# Patient Record
Sex: Male | Born: 1945 | Race: Black or African American | Hispanic: No | Marital: Married | Smoking: Never smoker
Health system: Southern US, Community
[De-identification: ages and names within clinical notes are randomized; demographics above are authoritative.]

## PROBLEM LIST (undated history)

## (undated) DIAGNOSIS — R7301 Impaired fasting glucose: Secondary | ICD-10-CM

## (undated) DIAGNOSIS — I700 Atherosclerosis of aorta: Secondary | ICD-10-CM

## (undated) DIAGNOSIS — N12 Tubulo-interstitial nephritis, not specified as acute or chronic: Secondary | ICD-10-CM

## (undated) DIAGNOSIS — C801 Malignant (primary) neoplasm, unspecified: Secondary | ICD-10-CM

## (undated) DIAGNOSIS — I10 Essential (primary) hypertension: Secondary | ICD-10-CM

## (undated) DIAGNOSIS — I2602 Saddle embolus of pulmonary artery with acute cor pulmonale: Secondary | ICD-10-CM

## (undated) DIAGNOSIS — K859 Acute pancreatitis without necrosis or infection, unspecified: Secondary | ICD-10-CM

## (undated) DIAGNOSIS — R911 Solitary pulmonary nodule: Secondary | ICD-10-CM

## (undated) HISTORY — PX: PROSTATE SURGERY: SHX751

---

## 2012-04-12 ENCOUNTER — Emergency Department (HOSPITAL_COMMUNITY)
Admission: EM | Admit: 2012-04-12 | Discharge: 2012-04-12 | Disposition: A | Discharge status: Home / Self Care | Payer: Medicaid Other | Attending: Emergency Medicine | Admitting: Emergency Medicine

## 2012-04-12 ENCOUNTER — Emergency Department (HOSPITAL_COMMUNITY): Payer: Medicaid Other

## 2012-04-12 ENCOUNTER — Encounter (HOSPITAL_COMMUNITY): Payer: Self-pay | Admitting: Emergency Medicine

## 2012-04-12 DIAGNOSIS — I10 Essential (primary) hypertension: Secondary | ICD-10-CM | POA: Insufficient documentation

## 2012-04-12 DIAGNOSIS — S298XXA Other specified injuries of thorax, initial encounter: Secondary | ICD-10-CM | POA: Insufficient documentation

## 2012-04-12 DIAGNOSIS — S99929A Unspecified injury of unspecified foot, initial encounter: Secondary | ICD-10-CM | POA: Insufficient documentation

## 2012-04-12 DIAGNOSIS — Y9389 Activity, other specified: Secondary | ICD-10-CM | POA: Insufficient documentation

## 2012-04-12 DIAGNOSIS — Y92410 Unspecified street and highway as the place of occurrence of the external cause: Secondary | ICD-10-CM | POA: Insufficient documentation

## 2012-04-12 DIAGNOSIS — Z79899 Other long term (current) drug therapy: Secondary | ICD-10-CM | POA: Insufficient documentation

## 2012-04-12 DIAGNOSIS — S8990XA Unspecified injury of unspecified lower leg, initial encounter: Secondary | ICD-10-CM | POA: Insufficient documentation

## 2012-04-12 HISTORY — DX: Essential (primary) hypertension: I10

## 2012-04-12 LAB — POCT I-STAT, CHEM 8
BUN: 14 mg/dL (ref 6–23)
Chloride: 105 mEq/L (ref 96–112)
Potassium: 4.4 mEq/L (ref 3.5–5.1)
Sodium: 141 mEq/L (ref 135–145)
TCO2: 26 mmol/L (ref 0–100)

## 2012-04-12 MED ORDER — OLMESARTAN-AMLODIPINE-HCTZ 20-5-12.5 MG PO TABS: ORAL_TABLET | ORAL | Status: DC

## 2012-04-12 MED ORDER — OXYCODONE-ACETAMINOPHEN 5-325 MG PO TABS
1.0000 | ORAL_TABLET | Freq: Once | ORAL | Status: AC
  Administered 2012-04-12: 1 via ORAL
  Filled 2012-04-12 (×2): qty 1

## 2012-04-12 MED ORDER — IBUPROFEN 400 MG PO TABS: 400.0000 mg | ORAL_TABLET | Freq: Four times a day (QID) | ORAL | Status: DC | PRN

## 2012-04-12 MED ORDER — OXYCODONE-ACETAMINOPHEN 5-325 MG PO TABS
1.0000 | ORAL_TABLET | ORAL | Status: DC | PRN
Start: 2012-04-12 — End: 2012-09-15

## 2012-04-12 MED ORDER — IOHEXOL 300 MG/ML  SOLN
80.0000 mL | Freq: Once | INTRAMUSCULAR | Status: AC | PRN
  Administered 2012-04-12: 80 mL via INTRAVENOUS
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
## 2012-04-12 NOTE — Progress Notes (Signed)
No pcp listed [facility] ED CM spoke with pt and family at bedside to confirm pcp as Jared Clements [address] [place]

## 2012-04-12 NOTE — ED Notes (Signed)
Family at bedside. 

## 2012-04-12 NOTE — ED Notes (Signed)
Pt returned from ct scan
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
## 2012-04-14 NOTE — ED Provider Notes (Signed)
Medical screening examination/treatment/procedure(s) were performed by non-physician practitioner and as supervising physician I was immediately available for consultation/collaboration.  Brianna Esson R. Ashlie Mcmenamy, MD 04/14/12 1057 

## 2012-09-15 ENCOUNTER — Encounter (HOSPITAL_COMMUNITY): Payer: Self-pay | Admitting: *Deleted

## 2012-09-15 ENCOUNTER — Inpatient Hospital Stay (HOSPITAL_COMMUNITY)
Admission: EM | Admit: 2012-09-15 | Discharge: 2012-09-18 | DRG: 690 | Disposition: A | Discharge status: Home / Self Care | Payer: Medicaid Other | Attending: Internal Medicine | Admitting: Internal Medicine

## 2012-09-15 DIAGNOSIS — N12 Tubulo-interstitial nephritis, not specified as acute or chronic: Principal | ICD-10-CM | POA: Diagnosis present

## 2012-09-15 DIAGNOSIS — A498 Other bacterial infections of unspecified site: Secondary | ICD-10-CM | POA: Diagnosis present

## 2012-09-15 DIAGNOSIS — N390 Urinary tract infection, site not specified: Secondary | ICD-10-CM

## 2012-09-15 DIAGNOSIS — E871 Hypo-osmolality and hyponatremia: Secondary | ICD-10-CM | POA: Diagnosis present

## 2012-09-15 DIAGNOSIS — E876 Hypokalemia: Secondary | ICD-10-CM | POA: Diagnosis present

## 2012-09-15 DIAGNOSIS — Z8546 Personal history of malignant neoplasm of prostate: Secondary | ICD-10-CM

## 2012-09-15 DIAGNOSIS — N179 Acute kidney failure, unspecified: Secondary | ICD-10-CM | POA: Diagnosis present

## 2012-09-15 DIAGNOSIS — E869 Volume depletion, unspecified: Secondary | ICD-10-CM | POA: Diagnosis present

## 2012-09-15 DIAGNOSIS — I10 Essential (primary) hypertension: Secondary | ICD-10-CM | POA: Diagnosis present

## 2012-09-15 HISTORY — DX: Malignant (primary) neoplasm, unspecified: C80.1

## 2012-09-15 HISTORY — DX: Tubulo-interstitial nephritis, not specified as acute or chronic: N12

## 2012-09-15 LAB — CBC WITH DIFFERENTIAL/PLATELET
Eosinophils Relative: 0 % (ref 0–5)
HCT: 35.1 % — ABNORMAL LOW (ref 39.0–52.0)
Hemoglobin: 12.6 g/dL — ABNORMAL LOW (ref 13.0–17.0)
Lymphocytes Relative: 11 % — ABNORMAL LOW (ref 12–46)
Lymphs Abs: 1 10*3/uL (ref 0.7–4.0)
MCV: 84.4 fL (ref 78.0–100.0)
Platelets: 186 10*3/uL (ref 150–400)
RBC: 4.16 MIL/uL — ABNORMAL LOW (ref 4.22–5.81)
WBC: 9.3 10*3/uL (ref 4.0–10.5)

## 2012-09-15 LAB — URINALYSIS, MICROSCOPIC ONLY
Ketones, ur: 15 mg/dL — AB
Nitrite: NEGATIVE
Protein, ur: >300 mg/dL — AB
Urobilinogen, UA: 2 mg/dL — ABNORMAL HIGH (ref 0.0–1.0)

## 2012-09-15 LAB — COMPREHENSIVE METABOLIC PANEL
ALT: 17 U/L (ref 0–53)
Alkaline Phosphatase: 89 U/L (ref 39–117)
CO2: 27 mEq/L (ref 19–32)
Calcium: 9.2 mg/dL (ref 8.4–10.5)
GFR calc Af Amer: 39 mL/min — ABNORMAL LOW (ref >90)
GFR calc non Af Amer: 34 mL/min — ABNORMAL LOW (ref >90)
Glucose, Bld: 125 mg/dL — ABNORMAL HIGH (ref 70–99)
Potassium: 2.9 mEq/L — ABNORMAL LOW (ref 3.5–5.1)
Sodium: 134 mEq/L — ABNORMAL LOW (ref 135–145)

## 2012-09-15 LAB — CBC
HCT: 34 % — ABNORMAL LOW (ref 39.0–52.0)
Hemoglobin: 12 g/dL — ABNORMAL LOW (ref 13.0–17.0)
WBC: 8.7 10*3/uL (ref 4.0–10.5)

## 2012-09-15 LAB — MAGNESIUM: Magnesium: 2 mg/dL (ref 1.5–2.5)

## 2012-09-15 LAB — LACTIC ACID, PLASMA: Lactic Acid, Venous: 1.4 mmol/L (ref 0.5–2.2)

## 2012-09-15 LAB — CREATININE, SERUM: GFR calc Af Amer: 35 mL/min — ABNORMAL LOW (ref >90)

## 2012-09-15 MED ORDER — SODIUM CHLORIDE 0.9 % IV SOLN
INTRAVENOUS | Status: DC
  Administered 2012-09-16 – 2012-09-17 (×4): via INTRAVENOUS

## 2012-09-15 MED ORDER — ACETAMINOPHEN 325 MG PO TABS
650.0000 mg | ORAL_TABLET | Freq: Four times a day (QID) | ORAL | Status: DC | PRN
  Administered 2012-09-15: 650 mg via ORAL
  Filled 2012-09-15: qty 2

## 2012-09-15 MED ORDER — SODIUM CHLORIDE 0.9 % IV SOLN
INTRAVENOUS | Status: AC
  Administered 2012-09-15: 1000 mL via INTRAVENOUS

## 2012-09-15 MED ORDER — DEXTROSE 5 % IV SOLN
1.0000 g | INTRAVENOUS | Status: DC
  Administered 2012-09-16 – 2012-09-17 (×2): 1 g via INTRAVENOUS
  Filled 2012-09-15 (×3): qty 10

## 2012-09-15 MED ORDER — ONDANSETRON HCL 4 MG/2ML IJ SOLN: 4.0000 mg | Freq: Four times a day (QID) | INTRAMUSCULAR | Status: DC | PRN

## 2012-09-15 MED ORDER — ACETAMINOPHEN 650 MG RE SUPP: 650.0000 mg | Freq: Four times a day (QID) | RECTAL | Status: DC | PRN

## 2012-09-15 MED ORDER — POTASSIUM CHLORIDE 10 MEQ/100ML IV SOLN
10.0000 meq | INTRAVENOUS | Status: DC
  Administered 2012-09-15: 10 meq via INTRAVENOUS
  Filled 2012-09-15: qty 100

## 2012-09-15 MED ORDER — HYDROCODONE-ACETAMINOPHEN 5-325 MG PO TABS
1.0000 | ORAL_TABLET | ORAL | Status: DC | PRN
Start: 2012-09-15 — End: 2012-09-18

## 2012-09-15 MED ORDER — SODIUM CHLORIDE 0.9 % IV SOLN
Freq: Once | INTRAVENOUS | Status: AC
  Administered 2012-09-15: 13:00:00 via INTRAVENOUS

## 2012-09-15 MED ORDER — HEPARIN SODIUM (PORCINE) 5000 UNIT/ML IJ SOLN
5000.0000 [IU] | Freq: Three times a day (TID) | INTRAMUSCULAR | Status: DC
  Administered 2012-09-15 – 2012-09-18 (×9): 5000 [IU] via SUBCUTANEOUS
  Filled 2012-09-15 (×12): qty 1

## 2012-09-15 MED ORDER — SODIUM CHLORIDE 0.9 % IV BOLUS (SEPSIS)
1000.0000 mL | Freq: Once | INTRAVENOUS | Status: AC
  Administered 2012-09-15: 1000 mL via INTRAVENOUS

## 2012-09-15 MED ORDER — DEXTROSE 5 % IV SOLN
1.0000 g | Freq: Once | INTRAVENOUS | Status: DC
  Administered 2012-09-15: 1 g via INTRAVENOUS
  Filled 2012-09-15: qty 10

## 2012-09-15 MED ORDER — ONDANSETRON HCL 4 MG PO TABS: 4.0000 mg | ORAL_TABLET | Freq: Four times a day (QID) | ORAL | Status: DC | PRN

## 2012-09-15 MED ORDER — SODIUM CHLORIDE 0.9 % IV BOLUS (SEPSIS)
500.0000 mL | Freq: Once | INTRAVENOUS | Status: AC
  Administered 2012-09-15: 500 mL via INTRAVENOUS

## 2012-09-15 NOTE — ED Notes (Signed)
PT with hx of prostate ca tx with radiation 2013 to ED c/o fever, lower abd pain and burning on urination x 2 days.  Denies nv, but c/o diarrhea.

## 2012-09-15 NOTE — H&P (Signed)
[facility] History and Physical  Jared Clements ZOX:[id number] DOB: Oct 17, 1945 DOA: 09/15/2012  Referring physician: Dr Radford Pax PCP: Jearld Lesch, MD  Specialists: none  Chief Complaint: fever  HPI: Jared Clements is a 67 y.o. male  Past medical history of prostate cancer status post surgery and radiation in 2013, comes in for fever 1 day prior to admission. He also relates some dysuria andnonbloody diarrhea.he relates his ear and also smells bad. color urine.  In the ED: Urinalysis obtained compatible with infectious etiology.  CBC does not show leukocytosis, he is also hyponatremic and in acute renal failure. His previous creatinine back in 2013 0.9. So we were asked to admit and further evaluate.  Review of Systems: The patient denies anorexia,  weight loss,, vision loss, decreased hearing, hoarseness, chest pain, syncope, dyspnea on exertion, peripheral edema, balance deficits, hemoptysis,  melena, hematochezia, severe indigestion/heartburn, hematuria, incontinence, genital sores, muscle weakness, suspicious skin lesions, transient blindness, difficulty walking, depression, unusual weight change, abnormal bleeding, enlarged lymph nodes, angioedema, and breast masses.    Past Medical History  Diagnosis Date  . Hypertension   . Cancer     prostates ca   Past Surgical History  Procedure Laterality Date  . Prostate surgery     Social History:  reports that he has never smoked. He does not have any smokeless tobacco history on file. He reports that he does not drink alcohol or use illicit drugs. Lives at home can perform all his ADL's  No Known Allergies  Family History  Problem Relation Age of Onset  . Other Mother   . Other Father     Prior to Admission medications   Medication Sig Start Date End Date Taking? Authorizing Provider  Olmesartan-Amlodipine-HCTZ (TRIBENZOR) 20-5-12.5 MG TABS Take 1 tablet by mouth daily.   Yes Historical Provider, MD   Physical  Exam: Filed Vitals:   09/15/12 1111 09/15/12 1229  BP: 117/65 97/54  Pulse: 96 78  Temp: 99.2 F (37.3 C) 99.3 F (37.4 C)  TempSrc: Oral Oral  Resp: 16 18  SpO2: 96% 97%    BP 97/54  Pulse 78  Temp(Src) 99.3 F (37.4 C) (Oral)  Resp 18  SpO2 97%  General Appearance:    Alert, cooperative, no distress, appears stated age  Head:    Normocephalic, without obvious abnormality, atraumatic           Throat:   Lips, mucosa, and tongue normal;   Neck:   Supple, symmetrical, trachea midline, no adenopathy;       thyroid:  No enlargement/tenderness/nodules; no carotid   bruit or JVD  Back:     Symmetric, no curvature, ROM normal, no CVA tenderness  Lungs:     Clear to auscultation bilaterally, respirations unlabored  Chest wall:    No tenderness or deformity  Heart:    Regular rate and rhythm, S1 and S2 normal, no murmur, rub   or gallop  Abdomen:     Soft, suprapubic tenderness, no CVA tenderness,bowel sounds active all four quadrants,    no masses, no organomegaly        Extremities:   Extremities normal, atraumatic, no cyanosis or edema  Pulses:   2+ and symmetric all extremities  Skin:   Skin color, texture, turgor normal, no rashes or lesions  Lymph nodes:   Cervical, supraclavicular, and axillary nodes normal  Neurologic:   CNII-XII intact. Normal strength, sensation and reflexes      throughout    Labs on Admission:  Basic Metabolic Panel:  Recent Labs Lab 09/15/12 1144  NA 134*  K 2.9*  CL 96  CO2 27  GLUCOSE 125*  BUN 34*  CREATININE 1.94*  CALCIUM 9.2   Liver Function Tests:  Recent Labs Lab 09/15/12 1144  AST 25  ALT 17  ALKPHOS 89  BILITOT 1.7*  PROT 7.9  ALBUMIN 3.3*   No results found for this basename: LIPASE, AMYLASE,  in the last 168 hours No results found for this basename: AMMONIA,  in the last 168 hours CBC:  Recent Labs Lab 09/15/12 1144  WBC 9.3  NEUTROABS 7.7  HGB 12.6*  HCT 35.1*  MCV 84.4  PLT 186   Cardiac  Enzymes: No results found for this basename: CKTOTAL, CKMB, CKMBINDEX, TROPONINI,  in the last 168 hours  BNP (last 3 results) No results found for this basename: PROBNP,  in the last 8760 hours CBG: No results found for this basename: GLUCAP,  in the last 168 hours  Radiological Exams on Admission: No results found.  EKG: Independently reviewed. none  Assessment/Plan  AKI (acute kidney injury)/ Pyelonephritis - His acute renal failure is most likely ultifactorial  secondary to urinary tract infection, ARB,  diuretic, he is a Hyponatremic and hypochloremic which is compatible with decreased intravascular volume. We'll go ahead and place a Foley study strict I.'s and O.'s and check a basic metabolic panel in the morning. 1 L of normal saline and continue normal saline.  - Dc'd ARB and HCTZ.  - Go ahead and get blood cultures and urine cultures were started empirically on Rocephin.  Hyponatremia: - THis most likely secondary to decreased intravascular volume. We'll go ahead and start him on aggressive IV fluid monitor strict I.'s and O.'s and check a basic metabolic panel in the morning.  Hypokalemia - he is on HCTZ at home over and stopped this and repeated check a magnesium level.  HTN: - will hold his BP medications  Code Status: full Family Communication: Son Disposition Plan: inpatient 3-4 days  Time spent: 67  Marinda Elk [facility] Pager [phone]  If 7PM-7AM, please contact night-coverage [email or web address] Password [email or web address] 09/15/2012, 1:33 PM

## 2012-09-15 NOTE — ED Provider Notes (Signed)
History     CSN: [id number]  Arrival date & time 09/15/12  1104   First MD Initiated Contact with Patient 09/15/12 1123      Chief Complaint  Patient presents with  . Urinary Tract Infection    (Consider location/radiation/quality/duration/timing/severity/associated sxs/prior treatment) HPI 67 yo M with HTN and h/o prostate ca treated with surgery and radiation presenting with subjective fevers/chills and dysuria since yesterday.  History is obtained from patient and his son.  He underwent treatment for prostate cancer in 2012-2013.  The current illness started with some non-bloody diarrhea yesterday morning.  Also has decreased appetite, but no nausea or vomiting.  No abdominal pain or flank pain.   Past Medical History  Diagnosis Date  . Hypertension   . Cancer     prostates ca    Past Surgical History  Procedure Laterality Date  . Prostate surgery      No family history on file.  History  Substance Use Topics  . Smoking status: Never Smoker   . Smokeless tobacco: Not on file  . Alcohol Use: No      Review of Systems  Constitutional: Positive for fever, chills and appetite change.  HENT: Negative.   Eyes: Negative.   Respiratory: Negative.   Cardiovascular: Negative.   Gastrointestinal: Positive for diarrhea. Negative for nausea, vomiting, abdominal pain and blood in stool.  Genitourinary: Positive for dysuria. Negative for flank pain, discharge and testicular pain.  Musculoskeletal: Negative.   Skin: Negative.   Neurological: Negative.     Allergies  Review of patient's allergies indicates no known allergies.  Home Medications   Current Outpatient Rx  Name  Route  Sig  Dispense  Refill  . Olmesartan-Amlodipine-HCTZ (TRIBENZOR) 20-5-12.5 MG TABS   Oral   Take 1 tablet by mouth daily.           BP 117/65  Pulse 96  Temp(Src) 99.2 F (37.3 C) (Oral)  Resp 16  SpO2 96%  Physical Exam  Constitutional: He is oriented to person, place, and  time.  Tall, think man.  No distress.  HENT:  Head: Normocephalic and atraumatic.  Cardiovascular: Normal rate, regular rhythm, normal heart sounds and intact distal pulses.   No murmur heard. Pulmonary/Chest: Effort normal and breath sounds normal. No respiratory distress. He has no wheezes. He has no rales.  Abdominal: Soft. Bowel sounds are normal. He exhibits no distension. There is no tenderness. There is no rebound and no guarding.  Musculoskeletal: He exhibits no edema and no tenderness.  Neurological: He is alert and oriented to person, place, and time. He exhibits normal muscle tone. Coordination normal.  Skin: Skin is warm and dry. No rash noted. No erythema.    ED Course  Procedures (including critical care time)  Labs Reviewed  CBC WITH DIFFERENTIAL - Abnormal; Notable for the following:    RBC 4.16 (*)    Hemoglobin 12.6 (*)    HCT 35.1 (*)    Neutrophils Relative 83 (*)    Lymphocytes Relative 11 (*)    All other components within normal limits  COMPREHENSIVE METABOLIC PANEL - Abnormal; Notable for the following:    Sodium 134 (*)    Potassium 2.9 (*)    Glucose, Bld 125 (*)    BUN 34 (*)    Creatinine, Ser 1.94 (*)    Albumin 3.3 (*)    Total Bilirubin 1.7 (*)    GFR calc non Af Amer 34 (*)    GFR calc Af Denyse Dago  39 (*)    All other components within normal limits  URINALYSIS, MICROSCOPIC ONLY - Abnormal; Notable for the following:    Color, Urine AMBER (*)    APPearance TURBID (*)    Glucose, UA 100 (*)    Hgb urine dipstick LARGE (*)    Bilirubin Urine MODERATE (*)    Ketones, ur 15 (*)    Protein, ur >300 (*)    Urobilinogen, UA 2.0 (*)    Leukocytes, UA LARGE (*)    Bacteria, UA FEW (*)    All other components within normal limits  URINE CULTURE   No results found.   No diagnosis found.    MDM  67 yo M with HTN and prostate ca treated with surgery and radiation presenting with subjective fevers/chills and dysuria since yesterday.  DDx primarily  including complicated UTI and proctitis.  Will check ua, cmp, cbc.  1:00PM: UA consistent with infection.  No white count, but creatinine more than doubled.  Blood pressure soft at 97/54.  He has already taken his BP meds this morning.  Will give 500cc bolus followed by 125cc/hr.  Will check lactic acid and baseline EKG.  Will given ceftriaxone 1g IV.  Discussed with [facility] who will admit the patient telemetry.         Phebe Colla, MD 09/15/12 [phone]

## 2012-09-15 NOTE — Progress Notes (Signed)
Jared Clements [id number] Admission Data: 09/15/2012 5:50 PM Attending Provider: Marinda Elk, MD  YQM:VHQIONGE,XBMWUX M, MD Consults/ Treatment Team:    Jared Clements is a 67 y.o. male patient admitted from ED with a UTI and acute kidney injury. awake, alert  & orientated  X 3, Limited English, son and daughter translate at bedside. Full Code, VSS - Blood pressure 149/86, pulse 116, temperature 98.6 F (37 C), temperature source Oral, resp. rate 18, height 6\' 5"  (1.956 m), weight 74.163 kg (163 lb 8 oz), SpO2 99.00%.,, no c/o shortness of breath, no c/o chest pain, no distress noted.   IV site WDL:  wrist left, condition patent and no redness with a transparent dsg that's clean dry and intact.  Allergies:  No Known Allergies   Past Medical History  Diagnosis Date  . Hypertension   . Cancer     prostates ca    History:  obtained from child. Tobacco/alcohol: denied none  Pt orientation to unit, room and routine. Information packet given to patient/family and safety video watched.  Admission INP armband ID verified with patient/family, and in place. SR up x 2, fall risk assessment complete with Patient and family verbalizing understanding of risks associated with falls. Pt verbalizes an understanding of how to use the call bell and to call for help before getting out of bed.  Skin, clean-dry- intact without evidence of bruising, or skin tears.   No evidence of skin break down noted on exam.     Will cont to monitor and assist as needed.  Cindra Eves, RN 09/15/2012 5:50 PM

## 2012-09-15 NOTE — ED Provider Notes (Signed)
I saw the patient, reviewed the resident's note and I agree with the findings and plan.   .Face to face Exam:  General:  Awake HEENT:  Atraumatic Resp:  Normal effort Abd:  Nondistended Neuro:No focal weakness    Shanitra Phillippi L Rasean Joos, MD 09/15/12 1343 

## 2012-09-16 ENCOUNTER — Inpatient Hospital Stay (HOSPITAL_COMMUNITY): Payer: Medicaid Other

## 2012-09-16 DIAGNOSIS — N390 Urinary tract infection, site not specified: Secondary | ICD-10-CM

## 2012-09-16 LAB — BASIC METABOLIC PANEL
BUN: 37 mg/dL — ABNORMAL HIGH (ref 6–23)
CO2: 27 mEq/L (ref 19–32)
Chloride: 106 mEq/L (ref 96–112)
Glucose, Bld: 119 mg/dL — ABNORMAL HIGH (ref 70–99)
Potassium: 3.1 mEq/L — ABNORMAL LOW (ref 3.5–5.1)

## 2012-09-16 MED ORDER — POTASSIUM CHLORIDE CRYS ER 20 MEQ PO TBCR
60.0000 meq | EXTENDED_RELEASE_TABLET | Freq: Four times a day (QID) | ORAL | Status: AC
  Administered 2012-09-16 (×2): 60 meq via ORAL
  Filled 2012-09-16 (×2): qty 3

## 2012-09-16 NOTE — Evaluation (Signed)
Physical Therapy Evaluation Patient Details Name: Jared Clements MRN: [id number] DOB: 31-Oct-1945 Today's Date: 09/16/2012 Time: 4098-1191 PT Time Calculation (min): 9 min  PT Assessment / Plan / Recommendation Clinical Impression  Pt adm with UTI and AKI.  Pt doing well with mobility.  Explained to son that pt may fatigue more quickly at home as he recovers from this illness.    PT Assessment  Patent does not need any further PT services    Follow Up Recommendations  No PT follow up    Does the patient have the potential to tolerate intense rehabilitation      Barriers to Discharge        Equipment Recommendations  None recommended by PT    Recommendations for Other Services     Frequency      Precautions / Restrictions Precautions Precautions: None   Pertinent Vitals/Pain N/A      Mobility  Bed Mobility Bed Mobility: Right Sidelying to Sit Right Sidelying to Sit: 7: Independent Transfers Transfers: Sit to Stand;Stand to Sit Sit to Stand: 7: Independent Stand to Sit: 7: Independent Ambulation/Gait Ambulation/Gait Assistance: 6: Modified independent (Device/Increase time) Ambulation Distance (Feet): 200 Feet Assistive device: None Gait Pattern: Step-through pattern    Exercises     PT Diagnosis:    PT Problem List:   PT Treatment Interventions:     PT Goals    Visit Information  Last PT Received On: 09/16/12 Assistance Needed: +1    Subjective Data  Subjective: Pt's son states that pt is eager to go home. Patient Stated Goal: Return home   Prior Functioning  Home Living Lives With: Family Available Help at Discharge: Family Home Layout: One level Home Adaptive Equipment: None Prior Function Level of Independence: Independent Communication Communication: Prefers language other than English (son interpreting)    Cognition  Cognition Arousal/Alertness: Awake/alert Behavior During Therapy: WFL for tasks assessed/performed Overall Cognitive  Status: Within Functional Limits for tasks assessed    Extremity/Trunk Assessment Right Upper Extremity Assessment RUE ROM/Strength/Tone: WFL for tasks assessed Left Upper Extremity Assessment LUE ROM/Strength/Tone: WFL for tasks assessed Right Lower Extremity Assessment RLE ROM/Strength/Tone: WFL for tasks assessed Left Lower Extremity Assessment LLE ROM/Strength/Tone: WFL for tasks assessed   Balance Balance Balance Assessed: Yes Static Standing Balance Static Standing - Balance Support: No upper extremity supported Static Standing - Level of Assistance: 7: Independent  End of Session PT - End of Session Activity Tolerance: Patient tolerated treatment well Patient left: in bed (sitting EOB) Nurse Communication: Mobility status  GP     Ramsay Bognar 09/16/2012, 10:07 AM  Skip Mayer PT [phone]

## 2012-09-16 NOTE — Progress Notes (Signed)
[facility] PROGRESS NOTE  Jared Clements ZOX:[id number] DOB: 27-Jun-1945 DOA: 09/15/2012 PCP: Jearld Lesch, MD  HPI/Subjective: Feels much better, his son is at bedside. Denies any fever or chills  Assessment/Plan:  Pyelonephritis/UTI -Patient started empirically on Rocephin. -Urine blood cultures obtained. -Antibiotics will be adjusted according to the urine culture results.  Acute renal failure -Nephrotoxic medications including ARB and hydrochlorothiazide discontinued. -Patient started on IV fluids. Check renal ultrasound. -Check BMP in am. Baseline creatinine in November of 2013 was 0.9 per  Hyponatremia -This is likely secondary to intravascular volume depletion. -Aggressively hydrate with IV fluids, check sodium in a.m.  Hypokalemia -Presented with potassium of 2.9 -Replace with oral supplements, check potassium in a.m.  Hypertension -Cold blood pressure medications including ARB and HCTZ.  Code Status: Full code Family Communication: Plan explained to the patient with his son at bedside. Disposition Plan: Inpatient   Consultants:  None  Procedures:  None  Antibiotics:  Rocephin    Objective: Filed Vitals:   09/15/12 1654 09/15/12 2121 09/15/12 2320 09/16/12 0446  BP: 149/86 140/67  136/73  Pulse: 116 102  78  Temp:  103 F (39.4 C) 100 F (37.8 C) 100.1 F (37.8 C)  TempSrc:  Oral Oral Oral  Resp:  20  20  Height:      Weight:      SpO2:  94%  95%    Intake/Output Summary (Last 24 hours) at 09/16/12 1100 Last data filed at 09/16/12 0851  Gross per 24 hour  Intake 565.42 ml  Output      0 ml  Net 565.42 ml   Filed Weights   09/15/12 1615  Weight: 74.163 kg (163 lb 8 oz)    Exam: General: Alert and awake, oriented x3, not in any acute distress. HEENT: anicteric sclera, pupils reactive to light and accommodation, EOMI CVS: S1-S2 clear, no murmur rubs or gallops Chest: clear to auscultation bilaterally, no wheezing, rales or  rhonchi Abdomen: soft nontender, nondistended, normal bowel sounds, no organomegaly Extremities: no cyanosis, clubbing or edema noted bilaterally Neuro: Cranial nerves II-XII intact, no focal neurological deficits  Data Reviewed: Basic Metabolic Panel:  Recent Labs Lab 09/15/12 1144 09/15/12 1555 09/16/12 0615  NA 134*  --  141  K 2.9*  --  3.1*  CL 96  --  106  CO2 27  --  27  GLUCOSE 125*  --  119*  BUN 34*  --  37*  CREATININE 1.94* 2.11* 2.36*  CALCIUM 9.2  --  8.4  MG  --  2.0  --    Liver Function Tests:  Recent Labs Lab 09/15/12 1144  AST 25  ALT 17  ALKPHOS 89  BILITOT 1.7*  PROT 7.9  ALBUMIN 3.3*   No results found for this basename: LIPASE, AMYLASE,  in the last 168 hours No results found for this basename: AMMONIA,  in the last 168 hours CBC:  Recent Labs Lab 09/15/12 1144 09/15/12 1709  WBC 9.3 8.7  NEUTROABS 7.7  --   HGB 12.6* 12.0*  HCT 35.1* 34.0*  MCV 84.4 85.6  PLT 186 156   Cardiac Enzymes: No results found for this basename: CKTOTAL, CKMB, CKMBINDEX, TROPONINI,  in the last 168 hours BNP (last 3 results) No results found for this basename: PROBNP,  in the last 8760 hours CBG: No results found for this basename: GLUCAP,  in the last 168 hours  No results found for this or any previous visit (from the past 240 hour(s)).  Studies: No results found.  Scheduled Meds: . cefTRIAXone (ROCEPHIN)  IV  1 g Intravenous Q24H  . heparin  5,000 Units Subcutaneous Q8H   Continuous Infusions: . sodium chloride 125 mL/hr at 09/16/12 1610    Active Problems:   AKI (acute kidney injury)   Pyelonephritis   Hyponatremia   Hypokalemia    Time spent: 40 minutes    [facility] A  [facility] Pager [phone] If 7PM-7AM, please contact night-coverage at [email or web address], password [facility] 09/16/2012, 11:00 AM  LOS: 1 day

## 2012-09-17 LAB — URINE CULTURE: Colony Count: >=100000

## 2012-09-17 LAB — BASIC METABOLIC PANEL
BUN: 23 mg/dL (ref 6–23)
CO2: 25 mEq/L (ref 19–32)
Chloride: 105 mEq/L (ref 96–112)
Creatinine, Ser: 1.5 mg/dL — ABNORMAL HIGH (ref 0.50–1.35)
Glucose, Bld: 100 mg/dL — ABNORMAL HIGH (ref 70–99)

## 2012-09-17 LAB — CBC
HCT: 28.4 % — ABNORMAL LOW (ref 39.0–52.0)
MCHC: 35.6 g/dL (ref 30.0–36.0)
MCV: 83.8 fL (ref 78.0–100.0)
RDW: 13 % (ref 11.5–15.5)
WBC: 4.9 10*3/uL (ref 4.0–10.5)

## 2012-09-17 MED ORDER — POTASSIUM CHLORIDE CRYS ER 20 MEQ PO TBCR
40.0000 meq | EXTENDED_RELEASE_TABLET | Freq: Once | ORAL | Status: AC
  Administered 2012-09-17: 40 meq via ORAL
  Filled 2012-09-17: qty 2

## 2012-09-17 NOTE — Progress Notes (Signed)
[facility] PROGRESS NOTE  Jared Clements AVW:[id number] DOB: 11/16/1945 DOA: 09/15/2012 PCP: Jearld Lesch, MD  HPI/Subjective: Feels much better, still complaining about blood-tinged urine  Assessment/Plan:  Pyelonephritis/UTI -Patient started empirically on Rocephin. -Urine culture showed Escherichia coli, resistant to fluoroquinolones -Likely can be discharged on oral Ceftin in a.m. Complete total 14 days of antibiotics.  Acute renal failure -Nephrotoxic medications including ARB and hydrochlorothiazide discontinued. -Creatinine baseline in November of 2013 was 0.9, renal ultrasound is negative for abnormalities. -Creatinine improved to 1.5 from 2.3, continue IV fluids check BMP in the morning.  Hyponatremia -This is likely secondary to intravascular volume depletion. -Aggressively hydrate with IV fluids, check sodium in a.m.  Hypokalemia -Presented with potassium of 2.9 -Replace with oral supplements, check potassium in a.m.  Hypertension -Hold blood pressure medications including ARB and HCTZ.  Code Status: Full code Family Communication: Plan explained to the patient with his son at bedside. Disposition Plan: Inpatient   Consultants:  None  Procedures:  None  Antibiotics:  Rocephin    Objective: Filed Vitals:   09/16/12 0446 09/16/12 1459 09/16/12 2100 09/17/12 0500  BP: 136/73 160/85 140/82 139/78  Pulse: 78 80 79 79  Temp: 100.1 F (37.8 C) 98.3 F (36.8 C) 99.8 F (37.7 C) 99.3 F (37.4 C)  TempSrc: Oral  Oral Oral  Resp: 20 18 20 20   Height:      Weight:      SpO2: 95% 98% 98% 96%    Intake/Output Summary (Last 24 hours) at 09/17/12 1112 Last data filed at 09/17/12 0536  Gross per 24 hour  Intake   3240 ml  Output      0 ml  Net   3240 ml   Filed Weights   09/15/12 1615  Weight: 74.163 kg (163 lb 8 oz)    Exam: General: Alert and awake, oriented x3, not in any acute distress. HEENT: anicteric sclera, pupils reactive to  light and accommodation, EOMI CVS: S1-S2 clear, no murmur rubs or gallops Chest: clear to auscultation bilaterally, no wheezing, rales or rhonchi Abdomen: soft nontender, nondistended, normal bowel sounds, no organomegaly Extremities: no cyanosis, clubbing or edema noted bilaterally Neuro: Cranial nerves II-XII intact, no focal neurological deficits  Data Reviewed: Basic Metabolic Panel:  Recent Labs Lab 09/15/12 1144 09/15/12 1555 09/16/12 0615 09/17/12 0455  NA 134*  --  141 136  K 2.9*  --  3.1* 3.7  CL 96  --  106 105  CO2 27  --  27 25  GLUCOSE 125*  --  119* 100*  BUN 34*  --  37* 23  CREATININE 1.94* 2.11* 2.36* 1.50*  CALCIUM 9.2  --  8.4 8.4  MG  --  2.0  --   --    Liver Function Tests:  Recent Labs Lab 09/15/12 1144  AST 25  ALT 17  ALKPHOS 89  BILITOT 1.7*  PROT 7.9  ALBUMIN 3.3*   No results found for this basename: LIPASE, AMYLASE,  in the last 168 hours No results found for this basename: AMMONIA,  in the last 168 hours CBC:  Recent Labs Lab 09/15/12 1144 09/15/12 1709 09/17/12 0455  WBC 9.3 8.7 4.9  NEUTROABS 7.7  --   --   HGB 12.6* 12.0* 10.1*  HCT 35.1* 34.0* 28.4*  MCV 84.4 85.6 83.8  PLT 186 156 158   Cardiac Enzymes: No results found for this basename: CKTOTAL, CKMB, CKMBINDEX, TROPONINI,  in the last 168 hours BNP (last 3 results) No results  found for this basename: PROBNP,  in the last 8760 hours CBG: No results found for this basename: GLUCAP,  in the last 168 hours  Recent Results (from the past 240 hour(s))  URINE CULTURE     Status: None   Collection Time    09/15/12 11:44 AM      Result Value Range Status   Specimen Description URINE, CLEAN CATCH   Final   Special Requests NONE   Final   Culture  Setup Time 09/15/2012 12:23   Final   Colony Count >=100,000 COLONIES/ML   Final   Culture ESCHERICHIA COLI   Final   Report Status 09/17/2012 FINAL   Final   Organism ID, Bacteria ESCHERICHIA COLI   Final  CULTURE, BLOOD  (ROUTINE X 2)     Status: None   Collection Time    09/15/12  3:58 PM      Result Value Range Status   Specimen Description BLOOD ARM RIGHT   Final   Special Requests BOTTLES DRAWN AEROBIC AND ANAEROBIC 10CC   Final   Culture  Setup Time 09/15/2012 22:49   Final   Culture     Final   Value:        BLOOD CULTURE RECEIVED NO GROWTH TO DATE CULTURE WILL BE HELD FOR 5 DAYS BEFORE ISSUING A FINAL NEGATIVE REPORT   Report Status PENDING   Incomplete  CULTURE, BLOOD (ROUTINE X 2)     Status: None   Collection Time    09/15/12  4:05 PM      Result Value Range Status   Specimen Description BLOOD HAND RIGHT   Final   Special Requests BOTTLES DRAWN AEROBIC ONLY   Final   Culture  Setup Time 09/15/2012 22:49   Final   Culture     Final   Value:        BLOOD CULTURE RECEIVED NO GROWTH TO DATE CULTURE WILL BE HELD FOR 5 DAYS BEFORE ISSUING A FINAL NEGATIVE REPORT   Report Status PENDING   Incomplete     Studies: US Renal  09/16/2012  *RADIOLOGY REPORT*  Clinical Data: Acute renal failure, pyelonephritis  RENAL/URINARY TRACT ULTRASOUND COMPLETE  Comparison:  None.  Findings:  Right Kidney:  Measures 11.1 cm.  No mass or hydronephrosis.  Left Kidney:  Measures 10.8 cm.  No mass or hydronephrosis.  Bladder:  Within normal limits.  IMPRESSION: Negative renal ultrasound.   Original Report Authenticated By: Charline Bills, M.D.     Scheduled Meds: . cefTRIAXone (ROCEPHIN)  IV  1 g Intravenous Q24H  . heparin  5,000 Units Subcutaneous Q8H   Continuous Infusions: . sodium chloride 125 mL/hr at 09/17/12 9629    Active Problems:   AKI (acute kidney injury)   Pyelonephritis   Hyponatremia   Hypokalemia    Time spent: 40 minutes    [facility] A  [facility] Pager [phone] If 7PM-7AM, please contact night-coverage at [email or web address], password [facility] 09/17/2012, 11:12 AM  LOS: 2 days

## 2012-09-18 LAB — BASIC METABOLIC PANEL
BUN: 14 mg/dL (ref 6–23)
Chloride: 101 mEq/L (ref 96–112)
Creatinine, Ser: 1.07 mg/dL (ref 0.50–1.35)
GFR calc Af Amer: 80 mL/min — ABNORMAL LOW (ref >90)

## 2012-09-18 MED ORDER — CEFUROXIME AXETIL 500 MG PO TABS: 500.0000 mg | ORAL_TABLET | Freq: Two times a day (BID) | ORAL | Status: DC

## 2012-09-18 MED ORDER — GUAIFENESIN-DM 100-10 MG/5ML PO SYRP
5.0000 mL | ORAL_SOLUTION | ORAL | Status: DC | PRN
  Administered 2012-09-18: 5 mL via ORAL
  Filled 2012-09-18: qty 5

## 2012-09-18 MED ORDER — CEFUROXIME AXETIL 500 MG PO TABS
500.0000 mg | ORAL_TABLET | Freq: Two times a day (BID) | ORAL | Status: DC
  Administered 2012-09-18: 500 mg via ORAL
  Filled 2012-09-18 (×3): qty 1

## 2012-09-18 NOTE — Progress Notes (Signed)
Utilization review complete. Mckenzie Bove RN CCM Case Mgmt phone [phone] 

## 2012-09-18 NOTE — Progress Notes (Signed)
   CARE MANAGEMENT NOTE 09/18/2012  Patient:  Jared Clements, Jared Clements   Account Number:  [id number]  Date Initiated:  09/18/2012  Documentation initiated by:  [facility]  Subjective/Objective Assessment:   UTI     Action/Plan:   no PT follow up needed   Anticipated DC Date:  09/18/2012   Anticipated DC Plan:  HOME/SELF CARE      DC Planning Services  CM consult      Choice offered to / List presented to:             Status of service:  Completed, signed off Medicare Important Message given?   (If response is "NO", the following Medicare IM given date fields will be blank) Date Medicare IM given:   Date Additional Medicare IM given:    Discharge Disposition:  HOME/SELF CARE  Per UR Regulation:    If discussed at Long Length of Stay Meetings, dates discussed:    Comments:  No NCM needs identified. Isidoro Donning RN CCM Case Mgmt phone [phone]

## 2012-09-18 NOTE — Discharge Summary (Signed)
Physician Discharge Summary  Jared Clements ZOX:[id number] DOB: [birth year] DOA: 09/15/2012  PCP: Jearld Lesch, MD  Admit date: 09/15/2012 Discharge date: 09/18/2012  Time spent: 40 minutes  Recommendations for Outpatient Follow-up:  CBC, Bmet in 1-2 weeks  Discharge Diagnoses:  Principal Problem:   Pyelonephritis Active Problems:   AKI (acute kidney injury)   Hyponatremia   Hypokalemia   Discharge Condition: stable, improved, back pain resolved.  Diet recommendation: heart healthy  Filed Weights   09/15/12 1615  Weight: 74.163 kg (163 lb 8 oz)    History of present illness:  Jared Clements is a 67 y.o. male with past medical history of prostate cancer status post surgery and radiation in 2013, comes in for fever 1 day prior to admission. He also relates some dysuria and nonbloody diarrhea.he relates his urine smells bad. In the ED: Urinalysis obtained compatible with infectious etiology.  He is hyponatremic and in acute renal failure. His previous creatinine back in 2013 0.9.   Hospital Course:  Pyelonephritis/UTI  -Patient started empirically on Rocephin.  -Urine culture showed Escherichia coli, resistant to fluoroquinolones  -Likely can be discharged on oral Ceftin in a.m. Complete total 10 days of antibiotics.   Acute renal failure  -Nephrotoxic medications including ARB and hydrochlorothiazide were held inpatient but resumed at discharge. -Creatinine baseline in November of 2013 was 0.9, renal ultrasound is negative for abnormalities.  -Creatinine improved to 1.07 from 2.3.   Hyponatremia  -Resolved with IV fluids -This is likely secondary to intravascular volume depletion.   Hypokalemia  -Presented with potassium of 2.9  -Replaced with oral supplements, 4.1 on day of discharge.  Hypertension  -Stable.  Prior to admission medication resumed at discharge.   Discharge Exam: Filed Vitals:   09/17/12 1513 09/17/12 2100 09/17/12 2357 09/18/12 0500  BP: 159/90  180/87 166/88 165/97  Pulse: 74 91 86 72  Temp: 98.3 F (36.8 C) 99 F (37.2 C)  98.2 F (36.8 C)  TempSrc: Oral Oral  Oral  Resp: 18 18  18   Height:      Weight:      SpO2: 97% 99%  94%   General: Alert and awake, oriented x3, not in any acute distress, pleasant HEENT: anicteric sclera, pupils reactive to light and accommodation, EOMI.  Scarring at brow from traditional cultural  markings. CVS: S1-S2 clear, no murmur rubs or gallops  Chest: clear to auscultation bilaterally, no wheezing, rales or rhonchi, 3 large well healed scars in central chest. Abdomen: soft nontender, nondistended, normal bowel sounds, no organomegaly  Extremities: no cyanosis, clubbing or edema noted bilaterally  Neuro: Cranial nerves II-XII intact, no focal neurological deficits   Discharge Instructions  Discharge Orders   Future Orders Complete By Expires     Diet - low sodium heart healthy  As directed     Increase activity slowly  As directed         Medication List    TAKE these medications       cefUROXime 500 MG tablet  Commonly known as:  CEFTIN  Take 1 tablet (500 mg total) by mouth 2 (two) times daily with a meal.     TRIBENZOR 20-5-12.5 MG Tabs  Generic drug:  Olmesartan-Amlodipine-HCTZ  Take 1 tablet by mouth daily.           Follow-up Information   Follow up with Jearld Lesch, MD. Schedule an appointment as soon as possible for a visit in 2 weeks.   Contact information:   [address]  [place] [address] [phone]        The results of significant diagnostics from this hospitalization (including imaging, microbiology, ancillary and laboratory) are listed below for reference.    Significant Diagnostic Studies: US Renal  09/16/2012  *RADIOLOGY REPORT*  Clinical Data: Acute renal failure, pyelonephritis  RENAL/URINARY TRACT ULTRASOUND COMPLETE  Comparison:  None.  Findings:  Right Kidney:  Measures 11.1 cm.  No mass or hydronephrosis.  Left Kidney:  Measures  10.8 cm.  No mass or hydronephrosis.  Bladder:  Within normal limits.  IMPRESSION: Negative renal ultrasound.   Original Report Authenticated By: Charline Bills, M.D.     Microbiology: Recent Results (from the past 240 hour(s))  URINE CULTURE     Status: None   Collection Time    09/15/12 11:44 AM      Result Value Range Status   Specimen Description URINE, CLEAN CATCH   Final   Special Requests NONE   Final   Culture  Setup Time 09/15/2012 12:23   Final   Colony Count >=100,000 COLONIES/ML   Final   Culture ESCHERICHIA COLI   Final   Report Status 09/17/2012 FINAL   Final   Organism ID, Bacteria ESCHERICHIA COLI   Final  CULTURE, BLOOD (ROUTINE X 2)     Status: None   Collection Time    09/15/12  3:58 PM      Result Value Range Status   Specimen Description BLOOD ARM RIGHT   Final   Special Requests BOTTLES DRAWN AEROBIC AND ANAEROBIC 10CC   Final   Culture  Setup Time 09/15/2012 22:49   Final   Culture     Final   Value:        BLOOD CULTURE RECEIVED NO GROWTH TO DATE CULTURE WILL BE HELD FOR 5 DAYS BEFORE ISSUING A FINAL NEGATIVE REPORT   Report Status PENDING   Incomplete  CULTURE, BLOOD (ROUTINE X 2)     Status: None   Collection Time    09/15/12  4:05 PM      Result Value Range Status   Specimen Description BLOOD HAND RIGHT   Final   Special Requests BOTTLES DRAWN AEROBIC ONLY   Final   Culture  Setup Time 09/15/2012 22:49   Final   Culture     Final   Value:        BLOOD CULTURE RECEIVED NO GROWTH TO DATE CULTURE WILL BE HELD FOR 5 DAYS BEFORE ISSUING A FINAL NEGATIVE REPORT   Report Status PENDING   Incomplete        Antibiotic  Organism Organism Organism     ESCHERICHIA COLI     AMPICILLIN  >=32 RESISTANT R Final       CEFAZOLIN  <=4 SENSITIVE S Final       CEFTRIAXONE  <=1 SENSITIVE S Final       CIPROFLOXACIN  >=4 RESISTANT R Final       GENTAMICIN  <=1 SENSITIVE S Final       LEVOFLOXACIN  >=8 RESISTANT R Final       NITROFURANTOIN  <=16 SENSITIVE S  Final       PIP/TAZO  <=4 SENSITIVE S Final       TOBRAMYCIN  <=1 SENSITIVE S Final       TRIMETH/SULFA  >=320 RESISTANT R          Labs: Basic Metabolic Panel:  Recent Labs Lab 09/15/12 1144 09/15/12 1555 09/16/12 0615 09/17/12 0455 09/18/12 0600  NA  134*  --  141 136 138  K 2.9*  --  3.1* 3.7 4.1  CL 96  --  106 105 101  CO2 27  --  27 25 28   GLUCOSE 125*  --  119* 100* 104*  BUN 34*  --  37* 23 14  CREATININE 1.94* 2.11* 2.36* 1.50* 1.07  CALCIUM 9.2  --  8.4 8.4 9.1  MG  --  2.0  --   --   --    Liver Function Tests:  Recent Labs Lab 09/15/12 1144  AST 25  ALT 17  ALKPHOS 89  BILITOT 1.7*  PROT 7.9  ALBUMIN 3.3*   CBC:  Recent Labs Lab 09/15/12 1144 09/15/12 1709 09/17/12 0455  WBC 9.3 8.7 4.9  NEUTROABS 7.7  --   --   HGB 12.6* 12.0* 10.1*  HCT 35.1* 34.0* 28.4*  MCV 84.4 85.6 83.8  PLT 186 156 158   Signed:  YorkTora Kindred, PA-C [facility] 09/18/2012, 3:02 PM

## 2012-09-18 NOTE — Progress Notes (Signed)
NURSING PROGRESS NOTE  Jared Clements [id number] Discharge Data: 09/18/2012 10:52 AM Attending Provider: Clydia Llano, MD WGN:[id number],VHQION M, MD     [name] to be D/C'd Home per MD order.  Discussed with the patient the After Visit Summary and all questions fully answered. All IV's discontinued with no bleeding noted. All belongings returned to patient for patient to take home.   Last Vital Signs:  Blood pressure 165/97, pulse 72, temperature 98.2 F (36.8 C), temperature source Oral, resp. rate 18, height 6\' 5"  (1.956 m), weight 74.163 kg (163 lb 8 oz), SpO2 94.00%.  Discharge Medication List   Medication List    TAKE these medications       cefUROXime 500 MG tablet  Commonly known as:  CEFTIN  Take 1 tablet (500 mg total) by mouth 2 (two) times daily with a meal.     TRIBENZOR 20-5-12.5 MG Tabs  Generic drug:  Olmesartan-Amlodipine-HCTZ  Take 1 tablet by mouth daily.

## 2012-09-19 NOTE — Discharge Summary (Signed)
Addendum  Patient seen and examined, chart and data base reviewed.  I agree with the above assessment and discharge plan.  For full details please see Mrs. Algis Downs PA note.  Pyelonephritis and ARF, resolved.   Clint Lipps, MD [facility] Pager: [phone] 09/19/2012, 12:32 PM

## 2012-09-21 LAB — CULTURE, BLOOD (ROUTINE X 2): Culture: NO GROWTH

## 2014-10-21 ENCOUNTER — Encounter (HOSPITAL_COMMUNITY): Payer: Self-pay | Admitting: Emergency Medicine

## 2014-10-21 ENCOUNTER — Emergency Department (HOSPITAL_COMMUNITY)
Admission: EM | Admit: 2014-10-21 | Discharge: 2014-10-21 | Disposition: A | Discharge status: Home / Self Care | Payer: Medicaid Other | Attending: Emergency Medicine | Admitting: Emergency Medicine

## 2014-10-21 DIAGNOSIS — I10 Essential (primary) hypertension: Secondary | ICD-10-CM | POA: Diagnosis not present

## 2014-10-21 DIAGNOSIS — L910 Hypertrophic scar: Secondary | ICD-10-CM | POA: Diagnosis not present

## 2014-10-21 DIAGNOSIS — Z79899 Other long term (current) drug therapy: Secondary | ICD-10-CM | POA: Diagnosis not present

## 2014-10-21 DIAGNOSIS — I158 Other secondary hypertension: Secondary | ICD-10-CM | POA: Insufficient documentation

## 2014-10-21 DIAGNOSIS — Z8546 Personal history of malignant neoplasm of prostate: Secondary | ICD-10-CM | POA: Insufficient documentation

## 2014-10-21 DIAGNOSIS — R21 Rash and other nonspecific skin eruption: Secondary | ICD-10-CM | POA: Diagnosis present

## 2014-10-21 MED ORDER — HYDROCORTISONE 1 % EX CREA
TOPICAL_CREAM | CUTANEOUS | Status: DC
Start: 2014-10-21 — End: 2016-08-10

## 2014-10-21 MED ORDER — DIPHENHYDRAMINE HCL 25 MG PO TABS: 25.0000 mg | ORAL_TABLET | Freq: Four times a day (QID) | ORAL | Status: DC | PRN

## 2014-10-21 NOTE — ED Notes (Signed)
Pt c/o rash to chest x 2-3 weeks.

## 2014-10-21 NOTE — Progress Notes (Signed)
NCM consulted to speak to pt regarding receiving medical bills; NCM and [facility] met with pt and grandson to discuss this matter.  Pt informed that he must visit caregiver as assigned on his card or he may incur a bill.  Also made aware of process of changing that provider, if need be.  Pt states his provider will not see him because he incurred a bill weeks before he received his Medicaid card.  Pt grandson inquired about how to rectify bill received weeks before Medicaid enrollment- [facility] informed them to submit bill to DSS because they can retro enrollment.  Pt and grandson verbalize understanding of how to submit old bill for retro payment, how to change Medicaid provider, and needing PCP referral for dermatologist appointment.

## 2014-10-21 NOTE — Discharge Instructions (Signed)
You may take Benadryl every 6 hours as needed for itching- be careful as it causes drowsiness, do not drive while taking.  Use Hydrocortisone cream as needed for itching.  Follow up with your PCP in 1 week for blood pressure recheck.

## 2014-10-21 NOTE — ED Provider Notes (Signed)
Patient with keloids on anterior chest wall for the past year which would become pruritic over the past 1 month. No other complaint. He reports he can't see his primary care physician as he owes money. Case manager called. Patient has no signs of infection. Has keloids to anterior chest wall which are not reddened. Suggest blood pressure recheck one week  Orlie Dakin, MD 10/21/14 [phone]

## 2014-10-21 NOTE — ED Provider Notes (Signed)
CSN: [id number]     Arrival date & time 10/21/14  0807 History   First MD Initiated Contact with Patient 10/21/14 [phone]     Chief Complaint  Patient presents with  . Rash  . Hypertension     (Consider location/radiation/quality/duration/timing/severity/associated sxs/prior Treatment) Patient is a 69 y.o. male presenting with rash and hypertension. The history is provided by the patient and a relative.  Rash Location:  Torso Torso rash location:  L chest and R chest Quality: itchiness   Quality: not draining, not painful and not red   Severity:  Moderate Onset quality:  Gradual Duration: has had lesions for 1 year, itching worse over past month. Timing:  Constant Progression:  Worsening Chronicity:  New Context: not exposure to similar rash, not medications and not new detergent/soap   Relieved by:  None tried Worsened by:  Nothing tried Ineffective treatments:  None tried Associated symptoms: no abdominal pain, no diarrhea, no fatigue, no fever, no headaches, no induration, no nausea, no shortness of breath, no sore throat, no throat swelling, no tongue swelling and not vomiting   Hypertension Associated symptoms include a rash. Pertinent negatives include no abdominal pain, chest pain, chills, coughing, diaphoresis, fatigue, fever, headaches, nausea, sore throat or vomiting.    Past Medical History  Diagnosis Date  . Hypertension   . Cancer     prostates ca   Past Surgical History  Procedure Laterality Date  . Prostate surgery     Family History  Problem Relation Age of Onset  . Other Mother   . Other Father    History  Substance Use Topics  . Smoking status: Never Smoker   . Smokeless tobacco: Not on file  . Alcohol Use: No    Review of Systems  Constitutional: Negative for fever, chills, diaphoresis and fatigue.  HENT: Negative for sore throat.   Eyes: Negative for photophobia and visual disturbance.  Respiratory: Negative for cough, chest tightness and  shortness of breath.   Cardiovascular: Negative for chest pain, palpitations and leg swelling.  Gastrointestinal: Negative for nausea, vomiting, abdominal pain and diarrhea.  Skin: Positive for rash. Negative for color change and pallor.  Neurological: Negative for dizziness, light-headedness and headaches.  All other systems reviewed and are negative.     Allergies  Review of patient's allergies indicates no known allergies.  Home Medications   Prior to Admission medications   Medication Sig Start Date End Date Taking? Authorizing Provider  Olmesartan-Amlodipine-HCTZ (TRIBENZOR) 20-5-12.5 MG TABS Take 1 tablet by mouth daily.   Yes Historical Provider, MD  pravastatin (PRAVACHOL) 20 MG tablet Take 20 mg by mouth daily. 10/02/14  Yes Historical Provider, MD  diphenhydrAMINE (BENADRYL) 25 MG tablet Take 1 tablet (25 mg total) by mouth every 6 (six) hours as needed for itching. 10/21/14   Ellwood Dense, MD  hydrocortisone cream 1 % Apply to affected area on chest 2 times daily as needed for itching 10/21/14   Ellwood Dense, MD   BP 178/98 mmHg  Pulse 65  Temp(Src) 97.3 F (36.3 C) (Oral)  Resp 18  Ht 6\' 3"  (1.905 m)  Wt 170 lb (77.111 kg)  BMI 21.25 kg/m2  SpO2 98% Physical Exam  Constitutional: He is oriented to person, place, and time. He appears well-developed and well-nourished. No distress.  HENT:  Head: Normocephalic and atraumatic.  Mouth/Throat: Oropharynx is clear and moist.  Eyes: Conjunctivae and EOM are normal. Pupils are equal, round, and reactive to light.  Neck: Normal range of motion.  Neck supple.  Cardiovascular: Normal rate, regular rhythm, normal heart sounds and intact distal pulses.  Exam reveals no gallop and no friction rub.   No murmur heard. Pulmonary/Chest: Effort normal and breath sounds normal. No respiratory distress. He has no wheezes. He has no rales.  Abdominal: Soft. Bowel sounds are normal. He exhibits no distension. There is no tenderness. There is no  rebound and no guarding.  Musculoskeletal: Normal range of motion. He exhibits no edema or tenderness.  Neurological: He is alert and oriented to person, place, and time. GCS eye subscore is 4. GCS verbal subscore is 5. GCS motor subscore is 6.  Skin: Skin is warm and dry. He is not diaphoretic.  3 keloids in linear pattern across chest.  No overlying erythema, induration, drainage  Nursing note and vitals reviewed.   ED Course  Procedures (including critical care time) Labs Review Labs Reviewed - No data to display  Imaging Review Dg Abd 1 View  10/24/2014   CLINICAL DATA:  Abdominal pain  EXAM: ABDOMEN - 1 VIEW  COMPARISON:  None.  FINDINGS: Nonobstructive bowel gas pattern. Stool volume is moderate. No concerning intra-abdominal mass effect or calcification. Pelvic clips from pelvic lymphadenectomy in this patient with history of prostate cancer. L5 compression fracture with associated sclerosis suggesting chronicity.  IMPRESSION: Nonobstructive bowel gas pattern.   Electronically Signed   By: Monte Fantasia M.D.   On: 10/24/2014 03:39   US Abdomen Complete  10/24/2014   CLINICAL DATA:  Epigastric pain x1 day, acute pancreatitis  EXAM: ULTRASOUND ABDOMEN COMPLETE  COMPARISON:  None.  FINDINGS: Gallbladder: No gallstones, gallbladder wall thickening, or pericholecystic fluid. Negative sonographic Murphy's sign.  Common bile duct: Diameter: 4 mm  Liver: Coarse hepatic echotexture.  No focal hepatic lesion is seen.  IVC: Poorly visualized.  Pancreas: Not discretely visualized due to overlying bowel gas.  Spleen: Size and appearance within normal limits.  Right Kidney: Length: 9.6 cm.  No mass or hydronephrosis.  Left Kidney: Length: 9.9 cm.  No mass or hydronephrosis.  Abdominal aorta: No aneurysm visualized.  Other findings: None.  IMPRESSION: Negative abdominal ultrasound.   Electronically Signed   By: Julian Hy M.D.   On: 10/24/2014 08:37     EKG Interpretation None      MDM    Final diagnoses:  Keloid  Other secondary hypertension    69 yo M with PMH of HTN presenting with rash on chest.  Has had lesions on chest since he was a child, which started to grow over past year.  Became pruritic over past month.  Has not tried any mediations at home for itching or rash.  Denies new detergents, clothing, or other exposures.  No one at home with similar rash. Pt also hypertensive, takes medication at home which he reports compliance with.  Has not seen his PCP stating he was not allowed to return until he makes an overdue payment.    Exam significant for 3 keloids in linear pattern across chest.  No overlying erythema, induration, drainage.  No other acute findings.  Pt prescribed hydrocortisone cream, and Benadryl as needed for itching.  Advised PCP f/u for BP recheck- financial counseling info provided by case manager.  ED return precautions given.  No other concerns.  Discussed with attending Dr. Winfred Leeds.    Ellwood Dense, MD 10/24/14 1204  Orlie Dakin, MD 10/25/14 [phone]

## 2014-10-23 DIAGNOSIS — K859 Acute pancreatitis, unspecified: Principal | ICD-10-CM | POA: Diagnosis present

## 2014-10-23 DIAGNOSIS — I10 Essential (primary) hypertension: Secondary | ICD-10-CM | POA: Diagnosis present

## 2014-10-23 DIAGNOSIS — R000 Tachycardia, unspecified: Secondary | ICD-10-CM | POA: Diagnosis present

## 2014-10-23 DIAGNOSIS — E876 Hypokalemia: Secondary | ICD-10-CM | POA: Diagnosis present

## 2014-10-23 DIAGNOSIS — K5900 Constipation, unspecified: Secondary | ICD-10-CM | POA: Diagnosis present

## 2014-10-23 DIAGNOSIS — J9811 Atelectasis: Secondary | ICD-10-CM | POA: Diagnosis present

## 2014-10-23 DIAGNOSIS — E785 Hyperlipidemia, unspecified: Secondary | ICD-10-CM | POA: Diagnosis present

## 2014-10-23 DIAGNOSIS — E780 Pure hypercholesterolemia: Secondary | ICD-10-CM | POA: Diagnosis present

## 2014-10-23 DIAGNOSIS — Z8546 Personal history of malignant neoplasm of prostate: Secondary | ICD-10-CM

## 2014-10-24 ENCOUNTER — Inpatient Hospital Stay (HOSPITAL_COMMUNITY)
Admission: EM | Admit: 2014-10-24 | Discharge: 2014-10-28 | DRG: 439 | Disposition: A | Discharge status: Home / Self Care | Payer: Medicaid Other | Attending: Internal Medicine | Admitting: Internal Medicine

## 2014-10-24 ENCOUNTER — Inpatient Hospital Stay (HOSPITAL_COMMUNITY): Payer: Medicaid Other

## 2014-10-24 ENCOUNTER — Encounter (HOSPITAL_COMMUNITY): Payer: Self-pay | Admitting: Emergency Medicine

## 2014-10-24 ENCOUNTER — Emergency Department (HOSPITAL_COMMUNITY): Payer: Medicaid Other

## 2014-10-24 DIAGNOSIS — K859 Acute pancreatitis without necrosis or infection, unspecified: Secondary | ICD-10-CM

## 2014-10-24 DIAGNOSIS — R100 Acute abdomen: Secondary | ICD-10-CM | POA: Diagnosis not present

## 2014-10-24 DIAGNOSIS — E780 Pure hypercholesterolemia: Secondary | ICD-10-CM | POA: Diagnosis present

## 2014-10-24 DIAGNOSIS — Z8546 Personal history of malignant neoplasm of prostate: Secondary | ICD-10-CM | POA: Diagnosis not present

## 2014-10-24 DIAGNOSIS — R109 Unspecified abdominal pain: Secondary | ICD-10-CM | POA: Diagnosis not present

## 2014-10-24 DIAGNOSIS — K5900 Constipation, unspecified: Secondary | ICD-10-CM | POA: Diagnosis present

## 2014-10-24 DIAGNOSIS — K858 Other acute pancreatitis: Secondary | ICD-10-CM

## 2014-10-24 DIAGNOSIS — R0600 Dyspnea, unspecified: Secondary | ICD-10-CM | POA: Insufficient documentation

## 2014-10-24 DIAGNOSIS — I10 Essential (primary) hypertension: Secondary | ICD-10-CM | POA: Diagnosis present

## 2014-10-24 DIAGNOSIS — J9811 Atelectasis: Secondary | ICD-10-CM | POA: Diagnosis present

## 2014-10-24 DIAGNOSIS — E785 Hyperlipidemia, unspecified: Secondary | ICD-10-CM | POA: Diagnosis present

## 2014-10-24 DIAGNOSIS — R000 Tachycardia, unspecified: Secondary | ICD-10-CM | POA: Diagnosis present

## 2014-10-24 DIAGNOSIS — E876 Hypokalemia: Secondary | ICD-10-CM | POA: Diagnosis present

## 2014-10-24 HISTORY — DX: Acute pancreatitis without necrosis or infection, unspecified: K85.90

## 2014-10-24 LAB — CBC WITH DIFFERENTIAL/PLATELET
BASOS ABS: 0 10*3/uL (ref 0.0–0.1)
Basophils Relative: 0 % (ref 0–1)
EOS ABS: 0 10*3/uL (ref 0.0–0.7)
Eosinophils Relative: 0 % (ref 0–5)
HEMATOCRIT: 43.1 % (ref 39.0–52.0)
Hemoglobin: 14.8 g/dL (ref 13.0–17.0)
LYMPHS ABS: 0.5 10*3/uL — AB (ref 0.7–4.0)
LYMPHS PCT: 5 % — AB (ref 12–46)
MCH: 29.8 pg (ref 26.0–34.0)
MCHC: 34.3 g/dL (ref 30.0–36.0)
MCV: 86.9 fL (ref 78.0–100.0)
MONO ABS: 0.3 10*3/uL (ref 0.1–1.0)
MONOS PCT: 4 % (ref 3–12)
Neutro Abs: 8.2 10*3/uL — ABNORMAL HIGH (ref 1.7–7.7)
Neutrophils Relative %: 91 % — ABNORMAL HIGH (ref 43–77)
Platelets: 218 10*3/uL (ref 150–400)
RBC: 4.96 MIL/uL (ref 4.22–5.81)
RDW: 13.1 % (ref 11.5–15.5)
WBC: 9 10*3/uL (ref 4.0–10.5)

## 2014-10-24 LAB — CBC
HCT: 39.4 % (ref 39.0–52.0)
Hemoglobin: 13.6 g/dL (ref 13.0–17.0)
MCH: 29.8 pg (ref 26.0–34.0)
MCHC: 34.5 g/dL (ref 30.0–36.0)
MCV: 86.2 fL (ref 78.0–100.0)
Platelets: 183 10*3/uL (ref 150–400)
RBC: 4.57 MIL/uL (ref 4.22–5.81)
RDW: 13.2 % (ref 11.5–15.5)
WBC: 8.8 10*3/uL (ref 4.0–10.5)

## 2014-10-24 LAB — COMPREHENSIVE METABOLIC PANEL
ALK PHOS: 68 U/L (ref 38–126)
ALT: 14 U/L — ABNORMAL LOW (ref 17–63)
AST: 24 U/L (ref 15–41)
Albumin: 3.6 g/dL (ref 3.5–5.0)
Anion gap: 9 (ref 5–15)
BUN: 14 mg/dL (ref 6–20)
CALCIUM: 9.1 mg/dL (ref 8.9–10.3)
CO2: 27 mmol/L (ref 22–32)
CREATININE: 0.95 mg/dL (ref 0.61–1.24)
Chloride: 99 mmol/L — ABNORMAL LOW (ref 101–111)
GFR calc Af Amer: >60 mL/min (ref >60)
Glucose, Bld: 213 mg/dL — ABNORMAL HIGH (ref 65–99)
POTASSIUM: 3.9 mmol/L (ref 3.5–5.1)
SODIUM: 135 mmol/L (ref 135–145)
Total Bilirubin: 0.9 mg/dL (ref 0.3–1.2)
Total Protein: 7.9 g/dL (ref 6.5–8.1)

## 2014-10-24 LAB — URINALYSIS, ROUTINE W REFLEX MICROSCOPIC
Bilirubin Urine: NEGATIVE
GLUCOSE, UA: 100 mg/dL — AB
KETONES UR: NEGATIVE mg/dL
LEUKOCYTES UA: NEGATIVE
NITRITE: NEGATIVE
Protein, ur: 30 mg/dL — AB
Specific Gravity, Urine: 1.027 (ref 1.005–1.030)
UROBILINOGEN UA: 1 mg/dL (ref 0.0–1.0)
pH: 5.5 (ref 5.0–8.0)

## 2014-10-24 LAB — GLUCOSE, CAPILLARY
GLUCOSE-CAPILLARY: 101 mg/dL — AB (ref 65–99)
Glucose-Capillary: 104 mg/dL — ABNORMAL HIGH (ref 65–99)

## 2014-10-24 LAB — LIPASE, BLOOD
LIPASE: 731 U/L — AB (ref 22–51)
Lipase: 518 U/L — ABNORMAL HIGH (ref 22–51)

## 2014-10-24 LAB — BASIC METABOLIC PANEL
Anion gap: 8 (ref 5–15)
BUN: 14 mg/dL (ref 6–20)
CO2: 23 mmol/L (ref 22–32)
Calcium: 8.4 mg/dL — ABNORMAL LOW (ref 8.9–10.3)
Chloride: 105 mmol/L (ref 101–111)
Creatinine, Ser: 0.75 mg/dL (ref 0.61–1.24)
GFR calc Af Amer: >60 mL/min (ref >60)
Glucose, Bld: 129 mg/dL — ABNORMAL HIGH (ref 65–99)
Potassium: 3.9 mmol/L (ref 3.5–5.1)
SODIUM: 136 mmol/L (ref 135–145)

## 2014-10-24 LAB — URINE MICROSCOPIC-ADD ON

## 2014-10-24 LAB — TRIGLYCERIDES: Triglycerides: 78 mg/dL (ref <150)

## 2014-10-24 MED ORDER — ONDANSETRON HCL 4 MG/2ML IJ SOLN
4.0000 mg | Freq: Four times a day (QID) | INTRAMUSCULAR | Status: DC | PRN
  Administered 2014-10-24: 4 mg via INTRAVENOUS
  Filled 2014-10-24: qty 2

## 2014-10-24 MED ORDER — HYDROMORPHONE HCL 1 MG/ML IJ SOLN
1.0000 mg | INTRAMUSCULAR | Status: DC | PRN
  Administered 2014-10-24 (×3): 1 mg via INTRAVENOUS
  Filled 2014-10-24 (×3): qty 1

## 2014-10-24 MED ORDER — PNEUMOCOCCAL VAC POLYVALENT 25 MCG/0.5ML IJ INJ
0.5000 mL | INJECTION | INTRAMUSCULAR | Status: AC
  Administered 2014-10-25: 0.5 mL via INTRAMUSCULAR
  Filled 2014-10-24: qty 0.5

## 2014-10-24 MED ORDER — HYDRALAZINE HCL 20 MG/ML IJ SOLN
10.0000 mg | Freq: Four times a day (QID) | INTRAMUSCULAR | Status: DC | PRN
  Administered 2014-10-24 – 2014-10-28 (×4): 10 mg via INTRAVENOUS
  Filled 2014-10-24 (×4): qty 1

## 2014-10-24 MED ORDER — SODIUM CHLORIDE 0.9 % IV BOLUS (SEPSIS)
1000.0000 mL | Freq: Once | INTRAVENOUS | Status: AC
  Administered 2014-10-24: 1000 mL via INTRAVENOUS

## 2014-10-24 MED ORDER — ENOXAPARIN SODIUM 40 MG/0.4ML ~~LOC~~ SOLN
40.0000 mg | SUBCUTANEOUS | Status: DC
  Administered 2014-10-24 – 2014-10-27 (×4): 40 mg via SUBCUTANEOUS
  Filled 2014-10-24 (×7): qty 0.4

## 2014-10-24 MED ORDER — ONDANSETRON HCL 4 MG/2ML IJ SOLN
4.0000 mg | Freq: Once | INTRAMUSCULAR | Status: AC
  Administered 2014-10-24: 4 mg via INTRAVENOUS
  Filled 2014-10-24: qty 2

## 2014-10-24 MED ORDER — SODIUM CHLORIDE 0.9 % IV SOLN
INTRAVENOUS | Status: AC
  Administered 2014-10-24 (×2): via INTRAVENOUS

## 2014-10-24 MED ORDER — MORPHINE SULFATE 4 MG/ML IJ SOLN
4.0000 mg | Freq: Once | INTRAMUSCULAR | Status: AC
  Administered 2014-10-24: 4 mg via INTRAVENOUS
  Filled 2014-10-24: qty 1

## 2014-10-24 MED ORDER — BISACODYL 10 MG RE SUPP
10.0000 mg | Freq: Once | RECTAL | Status: AC
  Administered 2014-10-24: 10 mg via RECTAL
  Filled 2014-10-24: qty 1

## 2014-10-24 MED ORDER — ONDANSETRON HCL 4 MG PO TABS: 4.0000 mg | ORAL_TABLET | Freq: Four times a day (QID) | ORAL | Status: DC | PRN

## 2014-10-24 NOTE — Progress Notes (Signed)
PROGRESS NOTE  Jared Clements FTD:[id number] DOB: 1945-11-09 DOA: 10/24/2014 PCP: Harvie Junior, MD  Brief history  69 year old male with history of hypertension and prostate cancer presented with 2 day history of increasing abdominal pain with associated nausea and vomiting. The patient denies any recent travels or under court order exotic foods. The patient is from the [place], but has not traveled back since he immigrated to [place] 9 years ago. He denies any fevers, chills, chest discomfort, shortness breath hematochezia, melanoma. Initial evaluation revealed elevated lipase of 731. Assessment/Plan: acute pancreatitis - etiology unclear, but suspect medication induced--?HCTZ; has been off of pravastatin x 1 year - Abdominal ultrasound negative for cholelithiasis - Remain nothing by mouth for now - IV opiates - IV fluids - Triglyceride 78 - Check HIV Constipation -contributing to abdominal pain - CT abdomen and pelvis if no improvement after bowel movement  Hypertension -hydralazine prn SBP >180 as pt is currently npo   Family Communication:   Son updated at beside Disposition Plan:   Home in 2-3 days if stable      Procedures/Studies: Dg Abd 1 View  10/24/2014   CLINICAL DATA:  Abdominal pain  EXAM: ABDOMEN - 1 VIEW  COMPARISON:  None.  FINDINGS: Nonobstructive bowel gas pattern. Stool volume is moderate. No concerning intra-abdominal mass effect or calcification. Pelvic clips from pelvic lymphadenectomy in this patient with history of prostate cancer. L5 compression fracture with associated sclerosis suggesting chronicity.  IMPRESSION: Nonobstructive bowel gas pattern.   Electronically Signed   By: Monte Fantasia M.D.   On: 10/24/2014 03:39   US Abdomen Complete  10/24/2014   CLINICAL DATA:  Epigastric pain x1 day, acute pancreatitis  EXAM: ULTRASOUND ABDOMEN COMPLETE  COMPARISON:  None.  FINDINGS: Gallbladder: No gallstones, gallbladder wall thickening, or  pericholecystic fluid. Negative sonographic Murphy's sign.  Common bile duct: Diameter: 4 mm  Liver: Coarse hepatic echotexture.  No focal hepatic lesion is seen.  IVC: Poorly visualized.  Pancreas: Not discretely visualized due to overlying bowel gas.  Spleen: Size and appearance within normal limits.  Right Kidney: Length: 9.6 cm.  No mass or hydronephrosis.  Left Kidney: Length: 9.9 cm.  No mass or hydronephrosis.  Abdominal aorta: No aneurysm visualized.  Other findings: None.  IMPRESSION: Negative abdominal ultrasound.   Electronically Signed   By: Julian Hy M.D.   On: 10/24/2014 08:37         Subjective:  Patient complains of abdominal pain that is only slightly better than yesterday. Located mostly in epigastric as well as right lower quadrant and left lower quadrant. Patient had bowel movement in approximately 2-3 days ago. Denies any hematochezia, melena.  he has not had any emesis since he has been admitted. Denies any fevers, chills, chest pain, shortness breath, coughing, hemoptysis.  Objective: Filed Vitals:   10/24/14 0400 10/24/14 0415 10/24/14 0430 10/24/14 0514  BP: 175/89 180/92 171/90 156/82  Pulse: 76 77 81 77  Temp:    97.6 F (36.4 C)  TempSrc:    Oral  Resp:    17  Height:    6\' 3"  (1.905 m)  Weight:    77.1 kg (169 lb 15.6 oz)  SpO2: 96% 95% 96% 96%    Intake/Output Summary (Last 24 hours) at 10/24/14 1058 Last data filed at 10/24/14 0900  Gross per 24 hour  Intake    115 ml  Output    200 ml  Net    -  85 ml   Weight change:  Exam:   General:  Pt is alert, follows commands appropriately, not in acute distress  HEENT: No icterus, No thrush, No neck mass, [place]/AT  Cardiovascular: RRR, S1/S2, no rubs, no gallops  Respiratory: CTA bilaterally, no wheezing, no crackles, no rhonchi  Abdomen: Soft/+BS, epigastric and RLQ/LLQ pain without rebound, non distended, no guarding  Extremities: No edema, No lymphangitis, No petechiae, No rashes, no  synovitis  Data Reviewed: Basic Metabolic Panel:  Recent Labs Lab 10/24/14 0004 10/24/14 0604  NA 135 136  K 3.9 3.9  CL 99* 105  CO2 27 23  GLUCOSE 213* 129*  BUN 14 14  CREATININE 0.95 0.75  CALCIUM 9.1 8.4*   Liver Function Tests:  Recent Labs Lab 10/24/14 0004  AST 24  ALT 14*  ALKPHOS 68  BILITOT 0.9  PROT 7.9  ALBUMIN 3.6    Recent Labs Lab 10/24/14 0004 10/24/14 0604  LIPASE 731* 518*   No results for input(s): AMMONIA in the last 168 hours. CBC:  Recent Labs Lab 10/24/14 0004 10/24/14 0604  WBC 9.0 8.8  NEUTROABS 8.2*  --   HGB 14.8 13.6  HCT 43.1 39.4  MCV 86.9 86.2  PLT 218 183   Cardiac Enzymes: No results for input(s): CKTOTAL, CKMB, CKMBINDEX, TROPONINI in the last 168 hours. BNP: Invalid input(s): POCBNP CBG: No results for input(s): GLUCAP in the last 168 hours.  No results found for this or any previous visit (from the past 240 hour(s)).   Scheduled Meds: . enoxaparin (LOVENOX) injection  40 mg Subcutaneous Q24H  . [START ON 10/25/2014] pneumococcal 23 valent vaccine  0.5 mL Intramuscular Tomorrow-1000   Continuous Infusions: . sodium chloride 150 mL/hr at 10/24/14 0744     Jahmya Onofrio, DO  [facility] Pager [phone]  If 7PM-7AM, please contact night-coverage [email or web address] Password [email or web address] 10/24/2014, 10:58 AM   LOS: 0 days

## 2014-10-24 NOTE — ED Notes (Signed)
Patient transported to X-ray 

## 2014-10-24 NOTE — Progress Notes (Signed)
New Admission Note:   Arrival Method: via stretcher from ED Mental Orientation: Alert and Oriented x4, speaks Arabic Telemetry: N/A Assessment: Completed Skin: Intact, warm, and dry IV: Right AC Peripheral IV Normal Saline at 150 mL/hr Pain: Denies at this time Tubes: N/A Safety Measures: Educated on fall prevention safety plan, patient acknowledged and understood. Admission: Completed [address] [place] Orientation: Patient has been orientated to the room, unit and staff.  Family: Son at bedside  Orders have been reviewed and implemented. Will continue to monitor the patient. Call light has been placed within reach and bed alarm has been activated.    Dorothea Glassman, RN  Phone number: [phone]

## 2014-10-24 NOTE — Progress Notes (Signed)
BP 174/100. HR 116.  Patient in no acute distress.  Lying in bed.  Only complaint is some pain in abdomen.  Notified T. Rogue Bussing, NP.  Orders received for hydralazine 10 mg IV PRN.  Will administer and recheck BP shortly.  Will continue to monitor.

## 2014-10-24 NOTE — ED Notes (Signed)
Pt. reports generalized abdominal pain with nausea and vomitting onset this afternoon , denies diarrhea or fever .

## 2014-10-24 NOTE — H&P (Signed)
PCP:   Harvie Junior, MD   Chief Complaint:  abd pain  HPI: 69 yo male from [place] h/o htn, prostate cancer comes in with one day of epigastric abdominal pain that radiates to his back with associated bilious vomiting.  No fevers.  Pt says he thought this was because he was constipated, has not had BM in several days.  But the pain was extremely severe.  Still has gallbladder.  New medicine of benadryl started 2 days ago for itching.  No other new meds.  Does not drink etoh much.  Given iv morphine and pain is still severe.  Review of Systems:  Positive and negative as per HPI otherwise all other systems are negative  Past Medical History: Past Medical History  Diagnosis Date  . Hypertension   . Cancer     prostates ca   Past Surgical History  Procedure Laterality Date  . Prostate surgery      Medications: Prior to Admission medications   Medication Sig Start Date End Date Taking? Authorizing Provider  diphenhydrAMINE (BENADRYL) 25 MG tablet Take 1 tablet (25 mg total) by mouth every 6 (six) hours as needed for itching. 10/21/14   Ellwood Dense, MD  hydrocortisone cream 1 % Apply to affected area on chest 2 times daily as needed for itching 10/21/14   Ellwood Dense, MD  Olmesartan-Amlodipine-HCTZ [facility]) 20-5-12.5 MG TABS Take 1 tablet by mouth daily.    Historical Provider, MD  pravastatin (PRAVACHOL) 20 MG tablet Take 20 mg by mouth daily. 10/02/14   Historical Provider, MD    Allergies:  No Known Allergies  Social History:  reports that he has never smoked. He does not have any smokeless tobacco history on file. He reports that he does not drink alcohol or use illicit drugs.  Family History: Family History  Problem Relation Age of Onset  . Other Mother   . Other Father     Physical Exam: Filed Vitals:   10/23/14 2354 10/24/14 0142 10/24/14 0202 10/24/14 0339  BP: 172/95 175/98 162/91 190/98  Pulse: 75 66 74 72  Temp: 97.1 F (36.2 C)     Resp: 20 18 18  20   SpO2: 96% 97% 98% 98%   General appearance: alert, cooperative and no distress Head: Normocephalic, without obvious abnormality, atraumatic Eyes: negative Nose: Nares normal. Septum midline. Mucosa normal. No drainage or sinus tenderness. Neck: no JVD and supple, symmetrical, trachea midline Lungs: clear to auscultation bilaterally Heart: regular rate and rhythm, S1, S2 normal, no murmur, click, rub or gallop Abdomen: soft, nd ttp epigastric area pos bs no r/g nonacute abdomen Extremities: extremities normal, atraumatic, no cyanosis or edema Pulses: 2+ and symmetric Skin: Skin color, texture, turgor normal. No rashes or lesions Neurologic: Grossly normal   Labs on Admission:   Recent Labs  10/24/14 0004  NA 135  K 3.9  CL 99*  CO2 27  GLUCOSE 213*  BUN 14  CREATININE 0.95  CALCIUM 9.1    Recent Labs  10/24/14 0004  AST 24  ALT 14*  ALKPHOS 68  BILITOT 0.9  PROT 7.9  ALBUMIN 3.6    Recent Labs  10/24/14 0004  LIPASE 731*    Recent Labs  10/24/14 0004  WBC 9.0  NEUTROABS 8.2*  HGB 14.8  HCT 43.1  MCV 86.9  PLT 218   Radiological Exams on Admission: Dg Abd 1 View  10/24/2014   CLINICAL DATA:  Abdominal pain  EXAM: ABDOMEN - 1 VIEW  COMPARISON:  None.  FINDINGS: Nonobstructive bowel gas pattern. Stool volume is moderate. No concerning intra-abdominal mass effect or calcification. Pelvic clips from pelvic lymphadenectomy in this patient with history of prostate cancer. L5 compression fracture with associated sclerosis suggesting chronicity.  IMPRESSION: Nonobstructive bowel gas pattern.   Electronically Signed   By: Monte Fantasia M.D.   On: 10/24/2014 03:39   abd xrays reviewed by myself  Assessment/Plan  69 yo male with acute abdominal pain with acute pancreatitis of unclear etiology  Principal Problem:   Acute pancreatitis-  Check trig level.  Order abdominal ultrasound to assess gallbladder.  Aggressive ivf.  Change from morphine to dilaudid  to see if we can improve his pain more.  Npo.  Monitor closely for any worsening in his abdominal exam.  lfts nml.  Repeat lipase later.  Consider surgical evaluation if gallbladder disease present.   Active Problems:   Hypertension-  Stable, hold oral meds at this time, treat pain before any iv antihypertensives.   HLD (hyperlipidemia)-  Check triglyceride level.   Abdominal pain, acute-  As above  Admit to medical bed.  FULL CODE.  Advit Trethewey A 10/24/2014, 4:11 AM

## 2014-10-24 NOTE — ED Provider Notes (Signed)
CSN: [id number]     Arrival date & time 10/23/14  2351 History  This chart was scribed for Jared Hacker, MD by Delphia Grates, ED Scribe. This patient was seen in room D34C/D34C and the patient's care was started at 2:53 AM.    Chief Complaint  Patient presents with  . Abdominal Pain    The history is provided by the patient. No language interpreter was used.     HPI Comments: Jared Clements is a 69 y.o. male who presents to the Emergency Department complaining of constant, moderate, sharp, "11/10" generalized abdominal pain with radiation to the back that began yesterday afternoon. There is associated nausea and vomiting, in addition to constipation (last BM 2 days ago). He also reports abdominal pain after eating. He reports history of high cholesterol. He denies EtOH consumption or history of lung or heart disease. He further denies fever or diarrhea.   [name],[name], MD  Past Medical History  Diagnosis Date  . Hypertension   . Cancer     prostates ca   Past Surgical History  Procedure Laterality Date  . Prostate surgery     Family History  Problem Relation Age of Onset  . Other Mother   . Other Father    History  Substance Use Topics  . Smoking status: Never Smoker   . Smokeless tobacco: Not on file  . Alcohol Use: No    Review of Systems  Constitutional: Negative.  Negative for fever.  Respiratory: Negative.  Negative for chest tightness and shortness of breath.   Cardiovascular: Negative.  Negative for chest pain.  Gastrointestinal: Positive for nausea, vomiting, abdominal pain and constipation. Negative for diarrhea.  Genitourinary: Negative.  Negative for dysuria.  Musculoskeletal: Negative for back pain.  Skin: Negative for rash.  Neurological: Negative for headaches.  All other systems reviewed and are negative.     Allergies  Review of patient's allergies indicates no known allergies.  Home Medications   Prior to Admission medications    Medication Sig Start Date End Date Taking? Authorizing Provider  diphenhydrAMINE (BENADRYL) 25 MG tablet Take 1 tablet (25 mg total) by mouth every 6 (six) hours as needed for itching. 10/21/14   Ellwood Dense, MD  hydrocortisone cream 1 % Apply to affected area on chest 2 times daily as needed for itching 10/21/14   Ellwood Dense, MD  Olmesartan-Amlodipine-HCTZ [facility]) 20-5-12.5 MG TABS Take 1 tablet by mouth daily.    Historical Provider, MD  pravastatin (PRAVACHOL) 20 MG tablet Take 20 mg by mouth daily. 10/02/14   Historical Provider, MD   Triage Vitals: BP 175/98 mmHg  Pulse 66  Temp(Src) 97.1 F (36.2 C)  Resp 18  SpO2 97%  Physical Exam  Constitutional: He is oriented to person, place, and time. He appears well-developed and well-nourished. No distress.  HENT:  Head: Normocephalic and atraumatic.  Cardiovascular: Normal rate, regular rhythm and normal heart sounds.   No murmur heard. Pulmonary/Chest: Effort normal and breath sounds normal. No respiratory distress. He has no wheezes.  Abdominal: Soft. Bowel sounds are normal. He exhibits distension. There is tenderness. There is no rebound.  Mild distention, soft, tenderness to palpation mostly over the Epigastrium without rebound or guarding, no signs of peritonitis  Musculoskeletal: He exhibits no edema.  Neurological: He is alert and oriented to person, place, and time.  Skin: Skin is warm and dry.  Psychiatric: He has a normal mood and affect.  Nursing note and vitals reviewed.   ED Course  Procedures (including critical care time)  DIAGNOSTIC STUDIES: Oxygen Saturation is 97% on room air, adequate by my interpretation.    COORDINATION OF CARE: At 31 Discussed treatment plan with patient which includes possible admission due to elevated Lipase of 731. Patient agrees.   Labs Review Labs Reviewed  CBC WITH DIFFERENTIAL/PLATELET - Abnormal; Notable for the following:    Neutrophils Relative % 91 (*)    Neutro Abs 8.2  (*)    Lymphocytes Relative 5 (*)    Lymphs Abs 0.5 (*)    All other components within normal limits  COMPREHENSIVE METABOLIC PANEL - Abnormal; Notable for the following:    Chloride 99 (*)    Glucose, Bld 213 (*)    ALT 14 (*)    All other components within normal limits  LIPASE, BLOOD - Abnormal; Notable for the following:    Lipase 731 (*)    All other components within normal limits  URINALYSIS, ROUTINE W REFLEX MICROSCOPIC (NOT AT [facility]) - Abnormal; Notable for the following:    APPearance TURBID (*)    Glucose, UA 100 (*)    Hgb urine dipstick TRACE (*)    Protein, ur 30 (*)    All other components within normal limits  URINE MICROSCOPIC-ADD ON    Imaging Review Dg Abd 1 View  10/24/2014   CLINICAL DATA:  Abdominal pain  EXAM: ABDOMEN - 1 VIEW  COMPARISON:  None.  FINDINGS: Nonobstructive bowel gas pattern. Stool volume is moderate. No concerning intra-abdominal mass effect or calcification. Pelvic clips from pelvic lymphadenectomy in this patient with history of prostate cancer. L5 compression fracture with associated sclerosis suggesting chronicity.  IMPRESSION: Nonobstructive bowel gas pattern.   Electronically Signed   By: Monte Fantasia M.D.   On: 10/24/2014 03:39     EKG Interpretation None      MDM   Final diagnoses:  Acute pancreatitis, unspecified pancreatitis type    Patient presents with abdominal pain. Onset of abdominal pain, nausea, vomiting, earlier yesterday afternoon. Following eating. Upon my evaluation, lab work had been completed and lipase elevated to 731. Patient denies any EtOH use. Does have a history of hyperlipidemia. Also noted be hypertensive. He was hypertensive on evaluation in the ER several days ago as well. No signs of peritonitis. Patient also reports constipation. Abdominal plain film obtained and shows no evidence of obstruction and no significant stool burden. Patient given fluids, pain, and nausea medication. Will admit to medicine for  further management of acute pancreatitis. Likely needs right upper quadrant ultrasound.  Discussed with Dr. Shanon Brow    I personally performed the services described in this documentation, which was scribed in my presence. The recorded information has been reviewed and is accurate.   Jared Hacker, MD 10/24/14 [phone]

## 2014-10-24 NOTE — Progress Notes (Signed)
Received Report from ED

## 2014-10-25 ENCOUNTER — Inpatient Hospital Stay (HOSPITAL_COMMUNITY): Payer: Medicaid Other

## 2014-10-25 DIAGNOSIS — R0600 Dyspnea, unspecified: Secondary | ICD-10-CM

## 2014-10-25 LAB — BASIC METABOLIC PANEL
Anion gap: 10 (ref 5–15)
BUN: 12 mg/dL (ref 6–20)
CHLORIDE: 102 mmol/L (ref 101–111)
CO2: 23 mmol/L (ref 22–32)
Calcium: 8.4 mg/dL — ABNORMAL LOW (ref 8.9–10.3)
Creatinine, Ser: 0.87 mg/dL (ref 0.61–1.24)
GFR calc Af Amer: >60 mL/min (ref >60)
GFR calc non Af Amer: >60 mL/min (ref >60)
GLUCOSE: 89 mg/dL (ref 65–99)
Potassium: 3.4 mmol/L — ABNORMAL LOW (ref 3.5–5.1)
Sodium: 135 mmol/L (ref 135–145)

## 2014-10-25 MED ORDER — AMLODIPINE BESYLATE 10 MG PO TABS
10.0000 mg | ORAL_TABLET | Freq: Every day | ORAL | Status: DC
  Administered 2014-10-25 – 2014-10-28 (×4): 10 mg via ORAL
  Filled 2014-10-25 (×4): qty 1

## 2014-10-25 NOTE — Progress Notes (Signed)
PROGRESS NOTE  Jared Clements GGE:[id number] DOB: Mar 16, 1946 DOA: 10/24/2014 PCP: Harvie Junior, MD     Brief history 69 year old male with history of hypertension and prostate cancer presented with 2 day history of increasing abdominal pain with associated nausea and vomiting. The patient denies any recent travels or undercooked or exotic foods. The patient is from the [place], but has not traveled back since he immigrated to [place] 9 years ago. He denies any fevers, chills, chest discomfort, shortness breath hematochezia, melena. Initial evaluation revealed elevated lipase of 731. Assessment/Plan: acute pancreatitis - etiology unclear, but suspect medication induced--?HCTZ; has been off of pravastatin x 1 year - Abdominal ultrasound negative for cholelithiasis - start clear liquid diet - IV opiates - Triglyceride 78 - Check HIV Dyspnea -crackles on exam -CXR -EKG in setting of new tachycardia -saline lock fluids for now pending cxr Constipation -contributing to abdominal pain - CT abdomen and pelvis if no improvement after bowel movement -add colace and senna Hypertension -hydralazine prn SBP >180 as pt is currently npo -restart amlodipine   Family Communication: Son updated at beside 5/26 Disposition Plan: Home in 2-3 days if stable  Procedures/Studies: Dg Abd 1 View  10/24/2014   CLINICAL DATA:  Abdominal pain  EXAM: ABDOMEN - 1 VIEW  COMPARISON:  None.  FINDINGS: Nonobstructive bowel gas pattern. Stool volume is moderate. No concerning intra-abdominal mass effect or calcification. Pelvic clips from pelvic lymphadenectomy in this patient with history of prostate cancer. L5 compression fracture with associated sclerosis suggesting chronicity.  IMPRESSION: Nonobstructive bowel gas pattern.   Electronically Signed   By: Monte Fantasia M.D.   On: 10/24/2014 03:39   US Abdomen Complete  10/24/2014   CLINICAL DATA:  Epigastric pain x1 day, acute  pancreatitis  EXAM: ULTRASOUND ABDOMEN COMPLETE  COMPARISON:  None.  FINDINGS: Gallbladder: No gallstones, gallbladder wall thickening, or pericholecystic fluid. Negative sonographic Murphy's sign.  Common bile duct: Diameter: 4 mm  Liver: Coarse hepatic echotexture.  No focal hepatic lesion is seen.  IVC: Poorly visualized.  Pancreas: Not discretely visualized due to overlying bowel gas.  Spleen: Size and appearance within normal limits.  Right Kidney: Length: 9.6 cm.  No mass or hydronephrosis.  Left Kidney: Length: 9.9 cm.  No mass or hydronephrosis.  Abdominal aorta: No aneurysm visualized.  Other findings: None.  IMPRESSION: Negative abdominal ultrasound.   Electronically Signed   By: Julian Hy M.D.   On: 10/24/2014 08:37         Subjective: Patient states that abdominal pain is better. Denies any nausea, vomiting, diarrhea. It is unclear whether the patient truly had a bowel movement. Denies fevers, chills, chest pain, dysuria, hematuria.  Objective: Filed Vitals:   10/24/14 2211 10/24/14 2329 10/25/14 0516 10/25/14 1000  BP: 174/100 155/90 169/90 164/87  Pulse: 116 115 109 109  Temp:   98.3 F (36.8 C) 98.4 F (36.9 C)  TempSrc:   Oral Oral  Resp:   18 18  Height:      Weight:      SpO2: 94%  93% 94%    Intake/Output Summary (Last 24 hours) at 10/25/14 1304 Last data filed at 10/25/14 1027  Gross per 24 hour  Intake 2077.5 ml  Output    400 ml  Net 1677.5 ml   Weight change:  Exam:   General:  Pt is alert, follows commands appropriately, not in acute distress  HEENT: No icterus, No thrush,[place]/AT  Cardiovascular:  RRR, S1/S2, no rubs, no gallops  Respiratory: Bibasilar crackles. No wheeze.  Abdomen: Soft/+BS, non tender, non distended, no guarding  Extremities: No edema, No lymphangitis, No petechiae, No rashes, no synovitis  Data Reviewed: Basic Metabolic Panel:  Recent Labs Lab 10/24/14 0004 10/24/14 0604 10/25/14 0610  NA 135 136 135  K 3.9 3.9  3.4*  CL 99* 105 102  CO2 27 23 23   GLUCOSE 213* 129* 89  BUN 14 14 12   CREATININE 0.95 0.75 0.87  CALCIUM 9.1 8.4* 8.4*   Liver Function Tests:  Recent Labs Lab 10/24/14 0004  AST 24  ALT 14*  ALKPHOS 68  BILITOT 0.9  PROT 7.9  ALBUMIN 3.6    Recent Labs Lab 10/24/14 0004 10/24/14 0604  LIPASE 731* 518*   No results for input(s): AMMONIA in the last 168 hours. CBC:  Recent Labs Lab 10/24/14 0004 10/24/14 0604  WBC 9.0 8.8  NEUTROABS 8.2*  --   HGB 14.8 13.6  HCT 43.1 39.4  MCV 86.9 86.2  PLT 218 183   Cardiac Enzymes: No results for input(s): CKTOTAL, CKMB, CKMBINDEX, TROPONINI in the last 168 hours. BNP: Invalid input(s): POCBNP CBG:  Recent Labs Lab 10/24/14 1133 10/24/14 1702  GLUCAP 101* 104*    No results found for this or any previous visit (from the past 240 hour(s)).   Scheduled Meds: . amLODipine  10 mg Oral Daily  . enoxaparin (LOVENOX) injection  40 mg Subcutaneous Q24H   Continuous Infusions:    Domonik Levario, DO  [facility] Pager [phone]  If 7PM-7AM, please contact night-coverage [email or web address] Password [email or web address] 10/25/2014, 1:04 PM   LOS: 1 day

## 2014-10-26 ENCOUNTER — Inpatient Hospital Stay (HOSPITAL_COMMUNITY): Payer: Medicaid Other

## 2014-10-26 ENCOUNTER — Encounter (HOSPITAL_COMMUNITY): Payer: Self-pay | Admitting: Radiology

## 2014-10-26 LAB — BASIC METABOLIC PANEL
Anion gap: 7 (ref 5–15)
BUN: 13 mg/dL (ref 6–20)
CALCIUM: 8.3 mg/dL — AB (ref 8.9–10.3)
CO2: 24 mmol/L (ref 22–32)
Chloride: 103 mmol/L (ref 101–111)
Creatinine, Ser: 0.88 mg/dL (ref 0.61–1.24)
Glucose, Bld: 118 mg/dL — ABNORMAL HIGH (ref 65–99)
Potassium: 3.6 mmol/L (ref 3.5–5.1)
SODIUM: 134 mmol/L — AB (ref 135–145)

## 2014-10-26 LAB — HIV ANTIBODY (ROUTINE TESTING W REFLEX): HIV Screen 4th Generation wRfx: NONREACTIVE

## 2014-10-26 MED ORDER — IOHEXOL 300 MG/ML  SOLN
80.0000 mL | Freq: Once | INTRAMUSCULAR | Status: AC | PRN
  Administered 2014-10-26: 100 mL via INTRAVENOUS

## 2014-10-26 MED ORDER — IOHEXOL 300 MG/ML  SOLN
25.0000 mL | INTRAMUSCULAR | Status: AC
  Administered 2014-10-26 (×2): 25 mL via ORAL

## 2014-10-26 MED ORDER — POTASSIUM CHLORIDE 2 MEQ/ML IV SOLN
INTRAVENOUS | Status: DC
  Administered 2014-10-26 – 2014-10-27 (×3): via INTRAVENOUS
  Filled 2014-10-26 (×6): qty 1000

## 2014-10-26 NOTE — Progress Notes (Signed)
,           PROGRESS NOTE  Jared Clements MWU:[id number] DOB: 04-01-1946 DOA: 10/24/2014 PCP: Harvie Junior, MD   Brief history 69 year old male with history of hypertension and prostate cancer presented with 2 day history of increasing abdominal pain with associated nausea and vomiting. The patient denies any recent travels or undercooked or exotic foods. The patient is from the [place], but has not traveled back since he immigrated to [place] 9 years ago. He denies any fevers, chills, chest discomfort, shortness breath hematochezia, melena. Initial evaluation revealed elevated lipase of 731. The patient was initially made nothing by mouth. He was then started on clear liquids,  but experienced increasing lower abdominal pain. As a result, CT abdomen and pelvis was ordered.  Assessment/Plan: acute pancreatitis - etiology unclear, but suspect medication induced--?HCTZ; has been off of pravastatin x 1 year - Abdominal ultrasound negative for cholelithiasis - started clear liquid diet-->pt c/o increasing lower abdominal pain with liquids - 10/26/14 CT abdomen and pelvis - IV opiates - Triglyceride 78 - Check HIV-pending - AM CBC, CMP Dyspnea -crackles on exam -CXR--bibasilar atelectasis--personally reviewed -EKG in setting of new tachycardia--sinus tachycardia with nonspecific T-wave changes -saline lock fluids for now pending cxr -No increased work of breathing -restart IVF today Constipation -contributing to abdominal pain -add colace and senna-->Still no bowel movement  Hypertension -hydralazine prn SBP >180 as pt is currently npo -restart amlodipine -d/c HCTZ   Family Communication: Daughter updated at beside 5/26 Disposition Plan: Home in 1-2 days if stable     Procedures/Studies: Dg Chest 2 View  10/25/2014   CLINICAL DATA:  Pt c/o chest and abdominal pain that starts in mid-lower abdomen and radiates up to mid sternum. Describes pain as an aching pain since  yesterday. Some sob per pt. Non smoker. Htn. Per chart- dyspnea with crackles on exam. md concerned with fluid overload.  EXAM: CHEST  2 VIEW  COMPARISON:  04/12/2012  FINDINGS: There is lung base opacity partly obscuring the hemidiaphragms. This is new from the prior chest radiograph. There is mild stable apical pleural parenchymal scarring. The remainder of the lungs is clear.  No pleural effusion or pneumothorax.  Cardiac silhouette is mildly enlarged. Aorta is mildly uncoiled and is also prominent. A soft tissue shadow along the right upper mediastinum is a vascular shadow based on the prior CT. No mediastinal or hilar masses or evidence of adenopathy.  IMPRESSION: Bibasilar lung opacity most consistent with atelectasis. Pneumonia should be considered in the proper clinical setting. No pulmonary edema.   Electronically Signed   By: Lajean Manes M.D.   On: 10/25/2014 17:41   Dg Abd 1 View  10/24/2014   CLINICAL DATA:  Abdominal pain  EXAM: ABDOMEN - 1 VIEW  COMPARISON:  None.  FINDINGS: Nonobstructive bowel gas pattern. Stool volume is moderate. No concerning intra-abdominal mass effect or calcification. Pelvic clips from pelvic lymphadenectomy in this patient with history of prostate cancer. L5 compression fracture with associated sclerosis suggesting chronicity.  IMPRESSION: Nonobstructive bowel gas pattern.   Electronically Signed   By: Monte Fantasia M.D.   On: 10/24/2014 03:39   US Abdomen Complete  10/24/2014   CLINICAL DATA:  Epigastric pain x1 day, acute pancreatitis  EXAM: ULTRASOUND ABDOMEN COMPLETE  COMPARISON:  None.  FINDINGS: Gallbladder: No gallstones, gallbladder wall thickening, or pericholecystic fluid. Negative sonographic Murphy's sign.  Common bile duct: Diameter: 4 mm  Liver: Coarse hepatic echotexture.  No focal hepatic lesion is seen.  IVC: Poorly visualized.  Pancreas: Not discretely visualized due to overlying bowel gas.  Spleen: Size and appearance within normal limits.  Right  Kidney: Length: 9.6 cm.  No mass or hydronephrosis.  Left Kidney: Length: 9.9 cm.  No mass or hydronephrosis.  Abdominal aorta: No aneurysm visualized.  Other findings: None.  IMPRESSION: Negative abdominal ultrasound.   Electronically Signed   By: Julian Hy M.D.   On: 10/24/2014 08:37         Subjective:  patient continues to complain of abdominal pain but now it is isolated more left lower quadrant and right lower quadrant. He states that it is constant. He is passing flatus. Denies any hematochezia or melena. Denies any vomiting, fevers, chills, chest pain, shortness breath, coughing, hemoptysis. There is no dysuria.   Objective: Filed Vitals:   10/25/14 1758 10/25/14 2011 10/26/14 0548 10/26/14 0941  BP: 158/86 178/91 166/88 163/80  Pulse: 101 118 106 96  Temp: 98.4 F (36.9 C) 98.3 F (36.8 C) 98.3 F (36.8 C) 99.2 F (37.3 C)  TempSrc: Oral Oral Oral Oral  Resp: 18 18 20 20   Height:      Weight:  76.3 kg (168 lb 3.4 oz)    SpO2: 96% 97% 96% 96%    Intake/Output Summary (Last 24 hours) at 10/26/14 1545 Last data filed at 10/26/14 1405  Gross per 24 hour  Intake    830 ml  Output      0 ml  Net    830 ml   Weight change:  Exam:   General:  Pt is alert, follows commands appropriately, not in acute distress  HEENT: No icterus, No thrush, No neck mass, [place]/AT  Cardiovascular: RRR, S1/S2, no rubs, no gallops  Respiratory: bibasilar crackles. No wheezing   Abdomen: Soft/+BS, non tender, non distended, no guarding; no hepatosplenomegaly   Extremities: No edema, No lymphangitis, No petechiae, No rashes, no synovitis  Data Reviewed: Basic Metabolic Panel:  Recent Labs Lab 10/24/14 0004 10/24/14 0604 10/25/14 0610 10/26/14 0416  NA 135 136 135 134*  K 3.9 3.9 3.4* 3.6  CL 99* 105 102 103  CO2 27 23 23 24   GLUCOSE 213* 129* 89 118*  BUN 14 14 12 13   CREATININE 0.95 0.75 0.87 0.88  CALCIUM 9.1 8.4* 8.4* 8.3*   Liver Function Tests:  Recent  Labs Lab 10/24/14 0004  AST 24  ALT 14*  ALKPHOS 68  BILITOT 0.9  PROT 7.9  ALBUMIN 3.6    Recent Labs Lab 10/24/14 0004 10/24/14 0604  LIPASE 731* 518*   No results for input(s): AMMONIA in the last 168 hours. CBC:  Recent Labs Lab 10/24/14 0004 10/24/14 0604  WBC 9.0 8.8  NEUTROABS 8.2*  --   HGB 14.8 13.6  HCT 43.1 39.4  MCV 86.9 86.2  PLT 218 183   Cardiac Enzymes: No results for input(s): CKTOTAL, CKMB, CKMBINDEX, TROPONINI in the last 168 hours. BNP: Invalid input(s): POCBNP CBG:  Recent Labs Lab 10/24/14 1133 10/24/14 1702  GLUCAP 101* 104*    No results found for this or any previous visit (from the past 240 hour(s)).   Scheduled Meds: . amLODipine  10 mg Oral Daily  . enoxaparin (LOVENOX) injection  40 mg Subcutaneous Q24H   Continuous Infusions: . sodium chloride 0.9 % 1,000 mL with potassium chloride 20 mEq infusion       Michele Judy, DO  [facility] Pager [phone]  If 7PM-7AM, please contact night-coverage [email or web address] Password [facility] 10/26/2014, 3:45  PM   LOS: 2 days

## 2014-10-27 LAB — COMPREHENSIVE METABOLIC PANEL
ALT: 18 U/L (ref 17–63)
ANION GAP: 8 (ref 5–15)
AST: 27 U/L (ref 15–41)
Albumin: 2.6 g/dL — ABNORMAL LOW (ref 3.5–5.0)
Alkaline Phosphatase: 64 U/L (ref 38–126)
BUN: 11 mg/dL (ref 6–20)
CO2: 22 mmol/L (ref 22–32)
CREATININE: 0.74 mg/dL (ref 0.61–1.24)
Calcium: 7.9 mg/dL — ABNORMAL LOW (ref 8.9–10.3)
Chloride: 104 mmol/L (ref 101–111)
GFR calc Af Amer: >60 mL/min (ref >60)
Glucose, Bld: 96 mg/dL (ref 65–99)
Potassium: 3.4 mmol/L — ABNORMAL LOW (ref 3.5–5.1)
SODIUM: 134 mmol/L — AB (ref 135–145)
Total Bilirubin: 2.6 mg/dL — ABNORMAL HIGH (ref 0.3–1.2)
Total Protein: 6.3 g/dL — ABNORMAL LOW (ref 6.5–8.1)

## 2014-10-27 LAB — CBC
HCT: 36.5 % — ABNORMAL LOW (ref 39.0–52.0)
Hemoglobin: 12.9 g/dL — ABNORMAL LOW (ref 13.0–17.0)
MCH: 30.1 pg (ref 26.0–34.0)
MCHC: 35.3 g/dL (ref 30.0–36.0)
MCV: 85.1 fL (ref 78.0–100.0)
Platelets: 175 10*3/uL (ref 150–400)
RBC: 4.29 MIL/uL (ref 4.22–5.81)
RDW: 13 % (ref 11.5–15.5)
WBC: 8.3 10*3/uL (ref 4.0–10.5)

## 2014-10-27 MED ORDER — POTASSIUM CHLORIDE CRYS ER 20 MEQ PO TBCR
20.0000 meq | EXTENDED_RELEASE_TABLET | Freq: Once | ORAL | Status: AC
  Administered 2014-10-27: 20 meq via ORAL
  Filled 2014-10-27: qty 1

## 2014-10-27 NOTE — Discharge Summary (Signed)
Physician Discharge Summary  Jared Clements [id number]:[id number] DOB: 06-29-1945 DOA: 10/24/2014  PCP: Harvie Junior, MD  Admit date: 10/24/2014 Discharge date: 10/28/14  Recommendations for Outpatient Follow-up:  1. Pt will need to follow up with PCP in 2 weeks post discharge 2. Please obtain BMP 1-2 weeks Discharge Diagnoses:  acute pancreatitis - etiology unclear, but suspect medication induced--?HCTZ; has been off of pravastatin x 1 year - Abdominal ultrasound negative for cholelithiasis - 10/26/14 CT abdomen and pelvis--changes consistent with pancreatitis without any other acute changes - IV opiates initially - Triglyceride 78 - Check HIV-neg -Abdominal pain improving after bowel movement -Advance diet to soft diet which pt tolerated Dyspnea -crackles on exam -CXR--bibasilar atelectasis--personally reviewed -EKG in setting of new tachycardia--sinus tachycardia with nonspecific T-wave changes -saline lock fluids for now pending cxr -No increased work of breathing -Improved after having a bowel movement Constipation -contributing to abdominal pain -Abdominal pain improved after having bowel movement -Continue daily Colace and senna Hypertension -hydralazine prn SBP >180 as pt is currently npo -restart amlodipine -d/c HCTZ -start metoprolol tartrate 25mg  bid Hypokalemia -Repleted -Mag--1.9  Discharge Condition: stable  Disposition:  home Diet:soft  Wt Readings from Last 3 Encounters:  10/28/14 76.15 kg (167 lb 14.1 oz)  10/21/14 77.111 kg (170 lb)  09/15/12 74.163 kg (163 lb 8 oz)    History of present illness:  69 year old male with history of hypertension and prostate cancer presented with 2 day history of increasing abdominal pain with associated nausea and vomiting. The patient denies any recent travels or undercooked or exotic foods. The patient is from the [place], but has not traveled back since he immigrated to [place] 9 years ago. He denies any fevers,  chills, chest discomfort, shortness breath hematochezia, melena. Initial evaluation revealed elevated lipase of 731. The patient was initially made nothing by mouth. He was then started on clear liquids, but experienced increasing lower abdominal pain. As a result, CT abdomen and pelvis was ordered. It showed changes consistent with pancreatitis without any other acute findings. The patient's abdominal pain improved after he had a bowel movement associated with the oral contrast. The patient's diet was gradually advanced to soft diet which she tolerated. The patient will remain off of HCTZ. He will restart amlodipine and his ARB.    Discharge Exam: Filed Vitals:   10/28/14 0751  BP: 137/70  Pulse: 98  Temp: 97.9 F (36.6 C)  Resp: 17   Filed Vitals:   10/27/14 2156 10/28/14 0500 10/28/14 0536 10/28/14 0751  BP: 150/84  162/90 137/70  Pulse: 100  100 98  Temp: 99.5 F (37.5 C)  100.6 F (38.1 C) 97.9 F (36.6 C)  TempSrc: Oral  Oral Oral  Resp: 18  20 17   Height:      Weight: 76.159 kg (167 lb 14.4 oz) 76.15 kg (167 lb 14.1 oz)    SpO2: 98%  95% 95%   General: A&O x 3, NAD, pleasant, cooperative Cardiovascular: RRR, no rub, no gallop, no S3 Respiratory: CTAB, no wheeze, no rhonchi Abdomen:soft, mild epigastric tenderness without any rebound nondistended, positive bowel sounds Extremities: No edema, No lymphangitis, no petechiae  Discharge Instructions      Discharge Instructions    Diet - low sodium heart healthy    Complete by:  As directed      Increase activity slowly    Complete by:  As directed             Medication List    STOP taking  these medications        pravastatin 20 MG tablet  Commonly known as:  PRAVACHOL     TRIBENZOR 20-5-12.5 MG Tabs  Generic drug:  Olmesartan-Amlodipine-HCTZ      TAKE these medications        amLODipine 10 MG tablet  Commonly known as:  NORVASC  Take 1 tablet (10 mg total) by mouth daily.     diphenhydrAMINE 25 MG  tablet  Commonly known as:  BENADRYL  Take 1 tablet (25 mg total) by mouth every 6 (six) hours as needed for itching.     hydrocortisone cream 1 %  Apply to affected area on chest 2 times daily as needed for itching     metoprolol tartrate 25 MG tablet  Commonly known as:  LOPRESSOR  Take 1 tablet (25 mg total) by mouth 2 (two) times daily.         The results of significant diagnostics from this hospitalization (including imaging, microbiology, ancillary and laboratory) are listed below for reference.    Significant Diagnostic Studies: Dg Chest 2 View  10/25/2014   CLINICAL DATA:  Pt c/o chest and abdominal pain that starts in mid-lower abdomen and radiates up to mid sternum. Describes pain as an aching pain since yesterday. Some sob per pt. Non smoker. Htn. Per chart- dyspnea with crackles on exam. md concerned with fluid overload.  EXAM: CHEST  2 VIEW  COMPARISON:  04/12/2012  FINDINGS: There is lung base opacity partly obscuring the hemidiaphragms. This is new from the prior chest radiograph. There is mild stable apical pleural parenchymal scarring. The remainder of the lungs is clear.  No pleural effusion or pneumothorax.  Cardiac silhouette is mildly enlarged. Aorta is mildly uncoiled and is also prominent. A soft tissue shadow along the right upper mediastinum is a vascular shadow based on the prior CT. No mediastinal or hilar masses or evidence of adenopathy.  IMPRESSION: Bibasilar lung opacity most consistent with atelectasis. Pneumonia should be considered in the proper clinical setting. No pulmonary edema.   Electronically Signed   By: Lajean Manes M.D.   On: 10/25/2014 17:41   Dg Abd 1 View  10/24/2014   CLINICAL DATA:  Abdominal pain  EXAM: ABDOMEN - 1 VIEW  COMPARISON:  None.  FINDINGS: Nonobstructive bowel gas pattern. Stool volume is moderate. No concerning intra-abdominal mass effect or calcification. Pelvic clips from pelvic lymphadenectomy in this patient with history of  prostate cancer. L5 compression fracture with associated sclerosis suggesting chronicity.  IMPRESSION: Nonobstructive bowel gas pattern.   Electronically Signed   By: Monte Fantasia M.D.   On: 10/24/2014 03:39   US Abdomen Complete  10/24/2014   CLINICAL DATA:  Epigastric pain x1 day, acute pancreatitis  EXAM: ULTRASOUND ABDOMEN COMPLETE  COMPARISON:  None.  FINDINGS: Gallbladder: No gallstones, gallbladder wall thickening, or pericholecystic fluid. Negative sonographic Murphy's sign.  Common bile duct: Diameter: 4 mm  Liver: Coarse hepatic echotexture.  No focal hepatic lesion is seen.  IVC: Poorly visualized.  Pancreas: Not discretely visualized due to overlying bowel gas.  Spleen: Size and appearance within normal limits.  Right Kidney: Length: 9.6 cm.  No mass or hydronephrosis.  Left Kidney: Length: 9.9 cm.  No mass or hydronephrosis.  Abdominal aorta: No aneurysm visualized.  Other findings: None.  IMPRESSION: Negative abdominal ultrasound.   Electronically Signed   By: Julian Hy M.D.   On: 10/24/2014 08:37   Ct Abdomen Pelvis W Contrast  10/26/2014   CLINICAL DATA:  Increasing abdominal pain for 2 days with nausea and vomiting, elevated lipase  EXAM: CT ABDOMEN AND PELVIS WITH CONTRAST  TECHNIQUE: Multidetector CT imaging of the abdomen and pelvis was performed using the standard protocol following bolus administration of intravenous contrast.  CONTRAST:  141mL OMNIPAQUE IOHEXOL 300 MG/ML  SOLN  COMPARISON:  10/24/2014  FINDINGS: Lung bases demonstrate small pleural effusions and mild bibasilar atelectasis.  The liver, gallbladder, spleen, adrenal glands and kidneys are within normal limits. Pancreas demonstrates considerable peripancreatic inflammatory change and fluid consistent with acute pancreatitis. This involves primarily the body and tail of the pancreas. Head and uncinate process or somewhat spared. Delayed images demonstrate normal delayed enhancement of the pancreas.  The appendix is  within normal limits. Mild diverticular change is noted without diverticulitis. The bladder is well distended. No pelvic mass lesion or sidewall abnormality is noted. The osseous structures show no acute abnormality.  IMPRESSION: Changes consistent with acute pancreatitis involving primarily the body and tail of the pancreas.  Small pleural effusions with bibasilar atelectasis.   Electronically Signed   By: Inez Catalina M.D.   On: 10/26/2014 20:56     Microbiology: No results found for this or any previous visit (from the past 240 hour(s)).   Labs: Basic Metabolic Panel:  Recent Labs Lab 10/24/14 0604 10/25/14 0610 10/26/14 0416 10/27/14 0748 10/28/14 0558  NA 136 135 134* 134* 134*  K 3.9 3.4* 3.6 3.4* 3.4*  CL 105 102 103 104 103  CO2 23 23 24 22 24   GLUCOSE 129* 89 118* 96 107*  BUN 14 12 13 11 9   CREATININE 0.75 0.87 0.88 0.74 0.81  CALCIUM 8.4* 8.4* 8.3* 7.9* 7.9*  MG  --   --   --   --  1.9   Liver Function Tests:  Recent Labs Lab 10/24/14 0004 10/27/14 0748  AST 24 27  ALT 14* 18  ALKPHOS 68 64  BILITOT 0.9 2.6*  PROT 7.9 6.3*  ALBUMIN 3.6 2.6*    Recent Labs Lab 10/24/14 0004 10/24/14 0604  LIPASE 731* 518*   No results for input(s): AMMONIA in the last 168 hours. CBC:  Recent Labs Lab 10/24/14 0004 10/24/14 0604 10/27/14 0748  WBC 9.0 8.8 8.3  NEUTROABS 8.2*  --   --   HGB 14.8 13.6 12.9*  HCT 43.1 39.4 36.5*  MCV 86.9 86.2 85.1  PLT 218 183 175   Cardiac Enzymes: No results for input(s): CKTOTAL, CKMB, CKMBINDEX, TROPONINI in the last 168 hours. BNP: Invalid input(s): POCBNP CBG:  Recent Labs Lab 10/24/14 1133 10/24/14 1702  GLUCAP 101* 104*    Time coordinating discharge:  Greater than 30 minutes  Signed:  Jashawn Floyd, DO [facility] Pager: [phone] 10/28/2014, 8:28 AM

## 2014-10-27 NOTE — Progress Notes (Signed)
PROGRESS NOTE  Jared Clements QQI:[id number] DOB: [birth year] DOA: 10/24/2014 PCP: Harvie Junior, MD  Brief history 69 year old male with history of hypertension and prostate cancer presented with 2 day history of increasing abdominal pain with associated nausea and vomiting. The patient denies any recent travels or undercooked or exotic foods. The patient is from the [place], but has not traveled back since he immigrated to [place] 9 years ago. He denies any fevers, chills, chest discomfort, shortness breath hematochezia, melena. Initial evaluation revealed elevated lipase of 731. The patient was initially made nothing by mouth. He was then started on clear liquids, but experienced increasing lower abdominal pain. As a result, CT abdomen and pelvis was ordered.  Assessment/Plan: acute pancreatitis - etiology unclear, but suspect medication induced--?HCTZ; has been off of pravastatin x 1 year - Abdominal ultrasound negative for cholelithiasis - 10/26/14 CT abdomen and pelvis--changes consistent with pancreatitis without any other acute changes - IV opiates - Triglyceride 78 - Check HIV-neg -Abdominal pain improving after bowel movement -Advance diet to soft diet Dyspnea -crackles on exam -CXR--bibasilar atelectasis--personally reviewed -EKG in setting of new tachycardia--sinus tachycardia with nonspecific T-wave changes -saline lock fluids for now pending cxr -No increased work of breathing -Improved after having a bowel movement Constipation -contributing to abdominal pain -Abdominal pain improved after having bowel movement -Continue daily Colace and senna Hypertension -hydralazine prn SBP >180 as pt is currently npo -restart amlodipine -d/c HCTZ Hypokalemia -Repleted  Family Communication: Daughter updated at beside 5/29 Disposition Plan: Home 5/30 if stable      Procedures/Studies: Dg Chest 2 View  10/25/2014   CLINICAL DATA:  Pt c/o chest and  abdominal pain that starts in mid-lower abdomen and radiates up to mid sternum. Describes pain as an aching pain since yesterday. Some sob per pt. Non smoker. Htn. Per chart- dyspnea with crackles on exam. md concerned with fluid overload.  EXAM: CHEST  2 VIEW  COMPARISON:  04/12/2012  FINDINGS: There is lung base opacity partly obscuring the hemidiaphragms. This is new from the prior chest radiograph. There is mild stable apical pleural parenchymal scarring. The remainder of the lungs is clear.  No pleural effusion or pneumothorax.  Cardiac silhouette is mildly enlarged. Aorta is mildly uncoiled and is also prominent. A soft tissue shadow along the right upper mediastinum is a vascular shadow based on the prior CT. No mediastinal or hilar masses or evidence of adenopathy.  IMPRESSION: Bibasilar lung opacity most consistent with atelectasis. Pneumonia should be considered in the proper clinical setting. No pulmonary edema.   Electronically Signed   By: Lajean Manes M.D.   On: 10/25/2014 17:41   Dg Abd 1 View  10/24/2014   CLINICAL DATA:  Abdominal pain  EXAM: ABDOMEN - 1 VIEW  COMPARISON:  None.  FINDINGS: Nonobstructive bowel gas pattern. Stool volume is moderate. No concerning intra-abdominal mass effect or calcification. Pelvic clips from pelvic lymphadenectomy in this patient with history of prostate cancer. L5 compression fracture with associated sclerosis suggesting chronicity.  IMPRESSION: Nonobstructive bowel gas pattern.   Electronically Signed   By: Monte Fantasia M.D.   On: 10/24/2014 03:39   US Abdomen Complete  10/24/2014   CLINICAL DATA:  Epigastric pain x1 day, acute pancreatitis  EXAM: ULTRASOUND ABDOMEN COMPLETE  COMPARISON:  None.  FINDINGS: Gallbladder: No gallstones, gallbladder wall thickening, or pericholecystic fluid. Negative sonographic Murphy's sign.  Common bile duct: Diameter: 4 mm  Liver: Coarse hepatic echotexture.  No focal hepatic lesion is seen.  IVC: Poorly visualized.   Pancreas: Not discretely visualized due to overlying bowel gas.  Spleen: Size and appearance within normal limits.  Right Kidney: Length: 9.6 cm.  No mass or hydronephrosis.  Left Kidney: Length: 9.9 cm.  No mass or hydronephrosis.  Abdominal aorta: No aneurysm visualized.  Other findings: None.  IMPRESSION: Negative abdominal ultrasound.   Electronically Signed   By: Julian Hy M.D.   On: 10/24/2014 08:37   Ct Abdomen Pelvis W Contrast  10/26/2014   CLINICAL DATA:  Increasing abdominal pain for 2 days with nausea and vomiting, elevated lipase  EXAM: CT ABDOMEN AND PELVIS WITH CONTRAST  TECHNIQUE: Multidetector CT imaging of the abdomen and pelvis was performed using the standard protocol following bolus administration of intravenous contrast.  CONTRAST:  139mL OMNIPAQUE IOHEXOL 300 MG/ML  SOLN  COMPARISON:  10/24/2014  FINDINGS: Lung bases demonstrate small pleural effusions and mild bibasilar atelectasis.  The liver, gallbladder, spleen, adrenal glands and kidneys are within normal limits. Pancreas demonstrates considerable peripancreatic inflammatory change and fluid consistent with acute pancreatitis. This involves primarily the body and tail of the pancreas. Head and uncinate process or somewhat spared. Delayed images demonstrate normal delayed enhancement of the pancreas.  The appendix is within normal limits. Mild diverticular change is noted without diverticulitis. The bladder is well distended. No pelvic mass lesion or sidewall abnormality is noted. The osseous structures show no acute abnormality.  IMPRESSION: Changes consistent with acute pancreatitis involving primarily the body and tail of the pancreas.  Small pleural effusions with bibasilar atelectasis.   Electronically Signed   By: Inez Catalina M.D.   On: 10/26/2014 20:56         Subjective: Patient is feeling better today. No nausea or vomiting, diarrhea, chest pain, shortness breath, fevers, chills.  Pain is  improving.  Objective: Filed Vitals:   10/26/14 2119 10/27/14 0500 10/27/14 0537 10/27/14 1058  BP: 176/93  177/88 161/85  Pulse: 102  104 96  Temp: 99.6 F (37.6 C)  99.2 F (37.3 C) 99.2 F (37.3 C)  TempSrc: Oral  Oral Oral  Resp: 18  22 22   Height:      Weight: 77.565 kg (171 lb) 77.56 kg (170 lb 15.8 oz)    SpO2: 94%  93% 96%    Intake/Output Summary (Last 24 hours) at 10/27/14 1432 Last data filed at 10/27/14 0900  Gross per 24 hour  Intake 1918.75 ml  Output    250 ml  Net 1668.75 ml   Weight change: 1.265 kg (2 lb 12.6 oz) Exam:   General:  Pt is alert, follows commands appropriately, not in acute distress  HEENT: No icterus, No thrush,  [place]/AT  Cardiovascular: RRR, S1/S2, no rubs, no gallops  Respiratory: Bibasilar crackles without wheezing.  Abdomen: Soft/+BS, mild epigastric tenderness without any rebound. non distended, no guarding  Extremities: No edema, No lymphangitis, No petechiae, No rashes, no synovitis  Data Reviewed: Basic Metabolic Panel:  Recent Labs Lab 10/24/14 0004 10/24/14 0604 10/25/14 0610 10/26/14 0416 10/27/14 0748  NA 135 136 135 134* 134*  K 3.9 3.9 3.4* 3.6 3.4*  CL 99* 105 102 103 104  CO2 27 23 23 24 22   GLUCOSE 213* 129* 89 118* 96  BUN 14 14 12 13 11   CREATININE 0.95 0.75 0.87 0.88 0.74  CALCIUM 9.1 8.4* 8.4* 8.3* 7.9*   Liver Function Tests:  Recent Labs Lab 10/24/14 0004 10/27/14 0748  AST 24 27  ALT 14* 18  ALKPHOS 68 64  BILITOT 0.9 2.6*  PROT 7.9 6.3*  ALBUMIN 3.6 2.6*    Recent Labs Lab 10/24/14 0004 10/24/14 0604  LIPASE 731* 518*   No results for input(s): AMMONIA in the last 168 hours. CBC:  Recent Labs Lab 10/24/14 0004 10/24/14 0604 10/27/14 0748  WBC 9.0 8.8 8.3  NEUTROABS 8.2*  --   --   HGB 14.8 13.6 12.9*  HCT 43.1 39.4 36.5*  MCV 86.9 86.2 85.1  PLT 218 183 175   Cardiac Enzymes: No results for input(s): CKTOTAL, CKMB, CKMBINDEX, TROPONINI in the last 168  hours. BNP: Invalid input(s): POCBNP CBG:  Recent Labs Lab 10/24/14 1133 10/24/14 1702  GLUCAP 101* 104*    No results found for this or any previous visit (from the past 240 hour(s)).   Scheduled Meds: . amLODipine  10 mg Oral Daily  . enoxaparin (LOVENOX) injection  40 mg Subcutaneous Q24H   Continuous Infusions: . sodium chloride 0.9 % 1,000 mL with potassium chloride 20 mEq infusion 75 mL/hr at 10/27/14 0748     Chanda Laperle, DO  [facility] Pager [phone]  If 7PM-7AM, please contact night-coverage [email or web address] Password [email or web address] 10/27/2014, 2:32 PM   LOS: 3 days

## 2014-10-28 LAB — BASIC METABOLIC PANEL
Anion gap: 7 (ref 5–15)
BUN: 9 mg/dL (ref 6–20)
CHLORIDE: 103 mmol/L (ref 101–111)
CO2: 24 mmol/L (ref 22–32)
CREATININE: 0.81 mg/dL (ref 0.61–1.24)
Calcium: 7.9 mg/dL — ABNORMAL LOW (ref 8.9–10.3)
GFR calc Af Amer: >60 mL/min (ref >60)
GFR calc non Af Amer: >60 mL/min (ref >60)
GLUCOSE: 107 mg/dL — AB (ref 65–99)
Potassium: 3.4 mmol/L — ABNORMAL LOW (ref 3.5–5.1)
Sodium: 134 mmol/L — ABNORMAL LOW (ref 135–145)

## 2014-10-28 LAB — MAGNESIUM: Magnesium: 1.9 mg/dL (ref 1.7–2.4)

## 2014-10-28 MED ORDER — METOPROLOL TARTRATE 25 MG PO TABS: 25.0000 mg | ORAL_TABLET | Freq: Two times a day (BID) | ORAL | Status: DC

## 2014-10-28 MED ORDER — AMLODIPINE BESYLATE 10 MG PO TABS: 10.0000 mg | ORAL_TABLET | Freq: Every day | ORAL | Status: DC

## 2014-10-28 MED ORDER — POTASSIUM CHLORIDE CRYS ER 20 MEQ PO TBCR
20.0000 meq | EXTENDED_RELEASE_TABLET | Freq: Once | ORAL | Status: AC
  Administered 2014-10-28: 20 meq via ORAL
  Filled 2014-10-28: qty 1

## 2014-10-28 NOTE — Progress Notes (Signed)
Patient discharge teaching given to patient and patient's son Gwenyth Bouillon), including activity, diet, follow-up appoints, and medications. Patient verbalized understanding of all discharge instructions. IV access was d/c'd. Vitals are stable. Skin is intact except as charted in most recent assessments. Pt to be escorted out by NT, to be driven home by family.  Jillyn Ledger, MBA, BS, RN

## 2016-08-04 ENCOUNTER — Encounter (HOSPITAL_COMMUNITY): Payer: Self-pay

## 2016-08-04 ENCOUNTER — Emergency Department (HOSPITAL_COMMUNITY): Payer: Medicaid Other

## 2016-08-04 ENCOUNTER — Inpatient Hospital Stay (HOSPITAL_COMMUNITY)
Admission: EM | Admit: 2016-08-04 | Discharge: 2016-08-10 | DRG: 175 | Disposition: A | Discharge status: Home Health | Payer: Medicaid Other | Attending: Internal Medicine | Admitting: Internal Medicine

## 2016-08-04 DIAGNOSIS — R7303 Prediabetes: Secondary | ICD-10-CM | POA: Diagnosis present

## 2016-08-04 DIAGNOSIS — E876 Hypokalemia: Secondary | ICD-10-CM | POA: Diagnosis present

## 2016-08-04 DIAGNOSIS — D638 Anemia in other chronic diseases classified elsewhere: Secondary | ICD-10-CM | POA: Diagnosis present

## 2016-08-04 DIAGNOSIS — R7301 Impaired fasting glucose: Secondary | ICD-10-CM | POA: Diagnosis present

## 2016-08-04 DIAGNOSIS — Z923 Personal history of irradiation: Secondary | ICD-10-CM

## 2016-08-04 DIAGNOSIS — I2699 Other pulmonary embolism without acute cor pulmonale: Secondary | ICD-10-CM

## 2016-08-04 DIAGNOSIS — Z79899 Other long term (current) drug therapy: Secondary | ICD-10-CM

## 2016-08-04 DIAGNOSIS — R0602 Shortness of breath: Secondary | ICD-10-CM | POA: Diagnosis present

## 2016-08-04 DIAGNOSIS — C799 Secondary malignant neoplasm of unspecified site: Secondary | ICD-10-CM

## 2016-08-04 DIAGNOSIS — R778 Other specified abnormalities of plasma proteins: Secondary | ICD-10-CM | POA: Diagnosis present

## 2016-08-04 DIAGNOSIS — Z6823 Body mass index (BMI) 23.0-23.9, adult: Secondary | ICD-10-CM

## 2016-08-04 DIAGNOSIS — Z9079 Acquired absence of other genital organ(s): Secondary | ICD-10-CM

## 2016-08-04 DIAGNOSIS — I700 Atherosclerosis of aorta: Secondary | ICD-10-CM | POA: Diagnosis present

## 2016-08-04 DIAGNOSIS — E785 Hyperlipidemia, unspecified: Secondary | ICD-10-CM | POA: Diagnosis present

## 2016-08-04 DIAGNOSIS — C61 Malignant neoplasm of prostate: Secondary | ICD-10-CM

## 2016-08-04 DIAGNOSIS — C779 Secondary and unspecified malignant neoplasm of lymph node, unspecified: Secondary | ICD-10-CM | POA: Diagnosis present

## 2016-08-04 DIAGNOSIS — R911 Solitary pulmonary nodule: Secondary | ICD-10-CM | POA: Diagnosis present

## 2016-08-04 DIAGNOSIS — I2609 Other pulmonary embolism with acute cor pulmonale: Secondary | ICD-10-CM | POA: Diagnosis present

## 2016-08-04 DIAGNOSIS — E440 Moderate protein-calorie malnutrition: Secondary | ICD-10-CM | POA: Diagnosis present

## 2016-08-04 DIAGNOSIS — E278 Other specified disorders of adrenal gland: Secondary | ICD-10-CM | POA: Diagnosis present

## 2016-08-04 DIAGNOSIS — R970 Elevated carcinoembryonic antigen [CEA]: Secondary | ICD-10-CM | POA: Diagnosis present

## 2016-08-04 DIAGNOSIS — Z8546 Personal history of malignant neoplasm of prostate: Secondary | ICD-10-CM

## 2016-08-04 DIAGNOSIS — I10 Essential (primary) hypertension: Secondary | ICD-10-CM | POA: Diagnosis present

## 2016-08-04 DIAGNOSIS — J9601 Acute respiratory failure with hypoxia: Secondary | ICD-10-CM | POA: Diagnosis present

## 2016-08-04 DIAGNOSIS — I248 Other forms of acute ischemic heart disease: Secondary | ICD-10-CM | POA: Diagnosis present

## 2016-08-04 DIAGNOSIS — C787 Secondary malignant neoplasm of liver and intrahepatic bile duct: Secondary | ICD-10-CM | POA: Diagnosis present

## 2016-08-04 DIAGNOSIS — R000 Tachycardia, unspecified: Secondary | ICD-10-CM | POA: Diagnosis present

## 2016-08-04 DIAGNOSIS — I2602 Saddle embolus of pulmonary artery with acute cor pulmonale: Secondary | ICD-10-CM | POA: Diagnosis present

## 2016-08-04 DIAGNOSIS — R7989 Other specified abnormal findings of blood chemistry: Secondary | ICD-10-CM

## 2016-08-04 DIAGNOSIS — E871 Hypo-osmolality and hyponatremia: Secondary | ICD-10-CM | POA: Diagnosis present

## 2016-08-04 DIAGNOSIS — R079 Chest pain, unspecified: Secondary | ICD-10-CM

## 2016-08-04 DIAGNOSIS — K869 Disease of pancreas, unspecified: Secondary | ICD-10-CM | POA: Diagnosis present

## 2016-08-04 HISTORY — DX: Saddle embolus of pulmonary artery with acute cor pulmonale: I26.02

## 2016-08-04 HISTORY — DX: Tubulo-interstitial nephritis, not specified as acute or chronic: N12

## 2016-08-04 HISTORY — DX: Acute pancreatitis without necrosis or infection, unspecified: K85.90

## 2016-08-04 HISTORY — DX: Solitary pulmonary nodule: R91.1

## 2016-08-04 HISTORY — DX: Atherosclerosis of aorta: I70.0

## 2016-08-04 HISTORY — DX: Impaired fasting glucose: R73.01

## 2016-08-04 LAB — PSA: PSA: 0.06 ng/mL (ref 0.00–4.00)

## 2016-08-04 LAB — I-STAT CHEM 8, ED
BUN: 18 mg/dL (ref 6–20)
CALCIUM ION: 0.89 mmol/L — AB (ref 1.15–1.40)
CREATININE: 0.8 mg/dL (ref 0.61–1.24)
Chloride: 102 mmol/L (ref 101–111)
GLUCOSE: 167 mg/dL — AB (ref 65–99)
HEMATOCRIT: 37 % — AB (ref 39.0–52.0)
HEMOGLOBIN: 12.6 g/dL — AB (ref 13.0–17.0)
Potassium: 4.2 mmol/L (ref 3.5–5.1)
Sodium: 130 mmol/L — ABNORMAL LOW (ref 135–145)
TCO2: 18 mmol/L (ref 0–100)

## 2016-08-04 LAB — CBG MONITORING, ED: GLUCOSE-CAPILLARY: 139 mg/dL — AB (ref 65–99)

## 2016-08-04 LAB — I-STAT TROPONIN, ED: TROPONIN I, POC: 0.16 ng/mL — AB (ref 0.00–0.08)

## 2016-08-04 LAB — BASIC METABOLIC PANEL
Anion gap: 15 (ref 5–15)
BUN: 14 mg/dL (ref 6–20)
CALCIUM: 8.5 mg/dL — AB (ref 8.9–10.3)
CO2: 18 mmol/L — ABNORMAL LOW (ref 22–32)
Chloride: 98 mmol/L — ABNORMAL LOW (ref 101–111)
Creatinine, Ser: 0.94 mg/dL (ref 0.61–1.24)
GFR calc Af Amer: >60 mL/min (ref >60)
GLUCOSE: 159 mg/dL — AB (ref 65–99)
Potassium: 3.8 mmol/L (ref 3.5–5.1)
Sodium: 131 mmol/L — ABNORMAL LOW (ref 135–145)

## 2016-08-04 LAB — GLUCOSE, CAPILLARY
Glucose-Capillary: 130 mg/dL — ABNORMAL HIGH (ref 65–99)
Glucose-Capillary: 127 mg/dL — ABNORMAL HIGH (ref 65–99)

## 2016-08-04 LAB — CBC
HCT: 34.7 % — ABNORMAL LOW (ref 39.0–52.0)
Hemoglobin: 11.4 g/dL — ABNORMAL LOW (ref 13.0–17.0)
MCH: 27.9 pg (ref 26.0–34.0)
MCHC: 32.9 g/dL (ref 30.0–36.0)
MCV: 84.8 fL (ref 78.0–100.0)
PLATELETS: 288 10*3/uL (ref 150–400)
RBC: 4.09 MIL/uL — ABNORMAL LOW (ref 4.22–5.81)
RDW: 14.3 % (ref 11.5–15.5)
WBC: 19.4 10*3/uL — ABNORMAL HIGH (ref 4.0–10.5)

## 2016-08-04 LAB — TROPONIN I
TROPONIN I: 0.46 ng/mL — AB (ref <0.03)
TROPONIN I: 3.31 ng/mL — AB (ref <0.03)

## 2016-08-04 LAB — HEPARIN LEVEL (UNFRACTIONATED): Heparin Unfractionated: 0.11 IU/mL — ABNORMAL LOW (ref 0.30–0.70)

## 2016-08-04 LAB — MRSA PCR SCREENING: MRSA by PCR: NEGATIVE

## 2016-08-04 LAB — BRAIN NATRIURETIC PEPTIDE: B Natriuretic Peptide: 606 pg/mL — ABNORMAL HIGH (ref 0.0–100.0)

## 2016-08-04 MED ORDER — ASPIRIN 81 MG PO CHEW
CHEWABLE_TABLET | ORAL | Status: AC
  Filled 2016-08-04: qty 1

## 2016-08-04 MED ORDER — INSULIN ASPART 100 UNIT/ML ~~LOC~~ SOLN
2.0000 [IU] | SUBCUTANEOUS | Status: DC
  Administered 2016-08-04 – 2016-08-05 (×3): 2 [IU] via SUBCUTANEOUS
  Administered 2016-08-05: 4 [IU] via SUBCUTANEOUS

## 2016-08-04 MED ORDER — SODIUM CHLORIDE 0.9 % IV SOLN
INTRAVENOUS | Status: DC
  Administered 2016-08-04 – 2016-08-06 (×3): via INTRAVENOUS

## 2016-08-04 MED ORDER — ASPIRIN 81 MG PO CHEW
324.0000 mg | CHEWABLE_TABLET | Freq: Once | ORAL | Status: AC
  Administered 2016-08-04: 324 mg via ORAL
  Filled 2016-08-04: qty 4

## 2016-08-04 MED ORDER — HEPARIN BOLUS VIA INFUSION
3000.0000 [IU] | Freq: Once | INTRAVENOUS | Status: AC
  Administered 2016-08-04: 3000 [IU] via INTRAVENOUS
  Filled 2016-08-04: qty 3000

## 2016-08-04 MED ORDER — CHLORHEXIDINE GLUCONATE 0.12 % MT SOLN
15.0000 mL | Freq: Two times a day (BID) | OROMUCOSAL | Status: DC
  Administered 2016-08-04 – 2016-08-10 (×10): 15 mL via OROMUCOSAL
  Filled 2016-08-04 (×9): qty 15

## 2016-08-04 MED ORDER — IOPAMIDOL (ISOVUE-370) INJECTION 76%
INTRAVENOUS | Status: AC
  Administered 2016-08-04: 100 mL
  Filled 2016-08-04: qty 100

## 2016-08-04 MED ORDER — NITROGLYCERIN 0.4 MG SL SUBL: 0.4000 mg | SUBLINGUAL_TABLET | SUBLINGUAL | Status: DC | PRN

## 2016-08-04 MED ORDER — PANTOPRAZOLE SODIUM 40 MG IV SOLR
40.0000 mg | INTRAVENOUS | Status: DC
  Administered 2016-08-04: 40 mg via INTRAVENOUS
  Filled 2016-08-04: qty 40

## 2016-08-04 MED ORDER — METOPROLOL TARTRATE 5 MG/5ML IV SOLN: 5.0000 mg | Freq: Four times a day (QID) | INTRAVENOUS | Status: DC | PRN

## 2016-08-04 MED ORDER — SODIUM CHLORIDE 0.9 % IV SOLN: 250.0000 mL | INTRAVENOUS | Status: DC | PRN

## 2016-08-04 MED ORDER — HEPARIN BOLUS VIA INFUSION
4000.0000 [IU] | Freq: Once | INTRAVENOUS | Status: AC
  Administered 2016-08-04: 4000 [IU] via INTRAVENOUS
  Filled 2016-08-04: qty 4000

## 2016-08-04 MED ORDER — HEPARIN (PORCINE) IN NACL 100-0.45 UNIT/ML-% IJ SOLN
1600.0000 [IU]/h | INTRAMUSCULAR | Status: DC
  Administered 2016-08-04 (×2): 900 [IU]/h via INTRAVENOUS
  Administered 2016-08-05: 1300 [IU]/h via INTRAVENOUS
  Administered 2016-08-06: 1400 [IU]/h via INTRAVENOUS
  Administered 2016-08-06: 1600 [IU]/h via INTRAVENOUS
  Filled 2016-08-04 (×4): qty 250

## 2016-08-04 MED ORDER — ORAL CARE MOUTH RINSE: 15.0000 mL | Freq: Two times a day (BID) | OROMUCOSAL | Status: DC

## 2016-08-04 NOTE — Progress Notes (Signed)
ANTICOAGULATION CONSULT NOTE - Follow Up Consult  Pharmacy Consult for Heparin Indication: chest pain/ACS and pulmonary embolus  No Known Allergies  Patient Measurements: Height: 5\' 10"  (177.8 cm) Weight: 170 lb (77.1 kg) IBW/kg (Calculated) : 73 Heparin Dosing Weight: 77.1 kg  Vital Signs: Temp: 97.4 F (36.3 C) (03/07 1930) Temp Source: Axillary (03/07 1930) BP: 143/98 (03/07 1900) Pulse Rate: 105 (03/07 1957)  Labs:  Recent Labs  08/04/16 1228 08/04/16 1243 08/04/16 1646 08/04/16 2126  HGB 11.4* 12.6*  --   --   HCT 34.7* 37.0*  --   --   PLT 288  --   --   --   HEPARINUNFRC  --   --   --  0.11*  CREATININE 0.94 0.80  --   --   TROPONINI  --   --  0.46*  --     Estimated Creatinine Clearance: 84.9 mL/min (by C-G formula based on SCr of 0.8 mg/dL).  Assessment:  71 presenting with CP/SOB. Pharmacy consulted to start heparin for ACS. Chest CTA positive for extensive bilateral PE with right heart strain. Noted not a candidate for EKOS as he has multiple liver lesions suspicious for metastatic cancer.    Initial heparin level is subtherapeutic (0.11) on 900 units/hr.  Goal of Therapy:  Heparin level 0.3-0.7 units/ml Monitor platelets by anticoagulation protocol: Yes   Plan:   Heparin 3000 units IV bolus.  Increase heparin drip to 1200 units/hr (~ 16 units/kg/hr)  Next heparin level and CBC ~6 hrs after increase.  Arty Baumgartner, [place] Pager: [phone] 08/04/2016,11:03 PM

## 2016-08-04 NOTE — Progress Notes (Signed)
Pt transported to CT2 and back to room with no events to note.

## 2016-08-04 NOTE — H&P (Signed)
PULMONARY / CRITICAL CARE MEDICINE   Name: Jared Clements MRN: [id number] DOB: 09/17/1945    ADMISSION DATE:  08/04/2016 CONSULTATION DATE:  08/04/2016  REFERRING MD:  Dr. Vanessa Ralphs   CHIEF COMPLAINT:  Dyspnea   HISTORY OF PRESENT ILLNESS:   71 year old male with PMH of prostate cancer s/p surgery and radiation in 2013 and HTN. Presents to ED on 3/7 with progressive dyspnea since this morning. Upon arrival to ED patient was tachypneic, tachycardiac, and hypoxic and placed on BIPAP. CTA Chest revealed bilateral pulmonary emboli extensive on the right side, with right heart strain with RV/LV Ratio of 3.2. Heparin GTT began, IR called for EKOS consult. PCCM was asked to admit.   PAST MEDICAL HISTORY :  He  has a past medical history of Cancer ([employer]) and Hypertension.  PAST SURGICAL HISTORY: He  has a past surgical history that includes Prostate surgery.  No Known Allergies  No current facility-administered medications on file prior to encounter.    Current Outpatient Prescriptions on File Prior to Encounter  Medication Sig  . amLODipine (NORVASC) 10 MG tablet Take 1 tablet (10 mg total) by mouth daily.  . diphenhydrAMINE (BENADRYL) 25 MG tablet Take 1 tablet (25 mg total) by mouth every 6 (six) hours as needed for itching.  . hydrocortisone cream 1 % Apply to affected area on chest 2 times daily as needed for itching  . metoprolol tartrate (LOPRESSOR) 25 MG tablet Take 1 tablet (25 mg total) by mouth 2 (two) times daily.    FAMILY HISTORY:  His indicated that his mother is deceased. He indicated that his father is deceased.    SOCIAL HISTORY: He  reports that he has never smoked. He has never used smokeless tobacco. He reports that he does not drink alcohol or use drugs.  REVIEW OF SYSTEMS:   All negative; except for those that are bolded, which indicate positives.  Constitutional: weight loss, weight gain, night sweats, fevers, chills, fatigue, weakness.  HEENT: headaches, sore throat,  sneezing, nasal congestion, post nasal drip, difficulty swallowing, tooth/dental problems, visual complaints, visual changes, ear aches. Neuro: difficulty with speech, weakness, numbness, ataxia. CV:  chest pain, orthopnea, PND, swelling in lower extremities, dizziness, palpitations, syncope.  Resp: cough, hemoptysis, dyspnea, wheezing. GI: heartburn, indigestion, abdominal pain, nausea, vomiting, diarrhea, constipation, change in bowel habits, loss of appetite, hematemesis, melena, hematochezia.  GU: dysuria, change in color of urine, urgency or frequency, flank pain, hematuria. MSK: joint pain or swelling, decreased range of motion. Psych: change in mood or affect, depression, anxiety, suicidal ideations, homicidal ideations. Skin: rash, itching, bruising.   SUBJECTIVE:  Remains on BIPAP. Feels severely short of breath. Family at bedside   VITAL SIGNS: BP 114/79   Pulse (!) 127   Resp (!) 41   Ht 5\' 10"  (1.778 m)   Wt 77.1 kg (170 lb)   SpO2 100%   BMI 24.39 kg/m   HEMODYNAMICS:    VENTILATOR SETTINGS:    INTAKE / OUTPUT: No intake/output data recorded.  PHYSICAL EXAMINATION: General:  Adult male, respiratory distress on BIPAP Neuro:  Alert, follows commands, moves extremities  HEENT:  Normocephalic  Cardiovascular:  Tachy, Regular Rhythm, no MRG  Lungs:  Diminished breath sounds, labored   Abdomen:  Non-distended, active bowel sounds  Musculoskeletal:  No acute Skin:  Warm, dry, intact   LABS:  BMET  Recent Labs Lab 08/04/16 1228 08/04/16 1243  NA 131* 130*  K 3.8 4.2  CL 98* 102  CO2 18*  --  BUN 14 18  CREATININE 0.94 0.80  GLUCOSE 159* 167*    Electrolytes  Recent Labs Lab 08/04/16 1228  CALCIUM 8.5*    CBC  Recent Labs Lab 08/04/16 1228 08/04/16 1243  WBC 19.4*  --   HGB 11.4* 12.6*  HCT 34.7* 37.0*  PLT 288  --     Coag's No results for input(s): APTT, INR in the last 168 hours.  Sepsis Markers No results for input(s):  LATICACIDVEN, PROCALCITON, O2SATVEN in the last 168 hours.  ABG No results for input(s): PHART, PCO2ART, PO2ART in the last 168 hours.  Liver Enzymes No results for input(s): AST, ALT, ALKPHOS, BILITOT, ALBUMIN in the last 168 hours.  Cardiac Enzymes No results for input(s): TROPONINI, PROBNP in the last 168 hours.  Glucose No results for input(s): GLUCAP in the last 168 hours.  Imaging Ct Angio Chest Pe W And/or Wo Contrast  Result Date: 08/04/2016 CLINICAL DATA:  Shortness of breath and chest pain EXAM: CT ANGIOGRAPHY CHEST WITH CONTRAST TECHNIQUE: Multidetector CT imaging of the chest was performed using the standard protocol during bolus administration of intravenous contrast. Multiplanar CT image reconstructions and MIPs were obtained to evaluate the vascular anatomy. CONTRAST:  100 mL Isovue 370 intravenous COMPARISON:  Chest x-ray 08/04/2016, CT chest 04/12/2012 FINDINGS: Cardiovascular: Satisfactory opacification of the pulmonary arteries to the segmental level. Extensive filling defect within right lower lobe lobar, segmental, and subsegmental arteries. Additional filling defects within segmental and subsegmental branches of the right upper and middle lobe arterial branches. Filling defect also present within left upper lobe lobar, segmental and subsegmental branches with additional small filling defects present within segmental and subsegmental left lower lobe arterial branches. RV LV ratio elevated at 3.2. Small pericardial effusion. Mildly ectatic ascending aorta. Bovine arch variant. Atherosclerotic calcifications. Mediastinum/Nodes: Trachea is midline. Thyroid grossly unremarkable. Scattered subcentimeter lymph nodes in the mediastinum, grossly unchanged, for example precarinal lymph node measures 1 cm. Ill-defined low density within the bilateral hilar regions, uncertain if this is all due to thrombus. The esophagus is grossly unremarkable. Lungs/Pleura: Mild hazy density in the  perihilar regions. No consolidation, effusion or pneumothorax. Within the anterior right lung base, there is a 6 x 4 mm oval nodule, 5 mm average diameter, series 6, image number 100. Upper Abdomen: No acute abnormality in the upper abdomen, possible mildly nodular contour of the liver. Mild gynecomastia. Musculoskeletal: Degenerative changes of the spine. No acute or suspicious bone lesions. Review of the MIP images confirms the above findings. IMPRESSION: 1. The study is positive for acute bilateral pulmonary emboli, extensive on the right side. Positive for acute PE with CT evidence of right heart strain (RV/LV Ratio = 3.2) consistent with at least submassive (intermediate risk) PE. The presence of right heart strain has been associated with an increased risk of morbidity and mortality. Please activate Code PE by paging [phone]. 2. Ill-defined soft tissue density within the bilateral hilar regions, uncertain if this is all secondary to thrombus, suggest short interval CT follow-up to exclude hilar adenopathy or possible developing soft tissue density in the left greater than right hila. 3. 5 mm pulmonary nodule in the anterior right lung base. No follow-up needed if patient is low-risk. Non-contrast chest CT can be considered in 12 months if patient is high-risk. This recommendation follows the consensus statement: Guidelines for Management of Incidental Pulmonary Nodules Detected on CT Images: From the Fleischner Society 2017; Radiology 2017; 284:228-243. 4. Mild hazy attenuation within the bilateral perihilar regions could relate to  mild edema. Critical Value/emergent results were called by telephone at the time of interpretation on 08/04/2016 at 2:55 pm to Dr. Deno Etienne , who verbally acknowledged these results. Electronically Signed   By: Donavan Foil M.D.   On: 08/04/2016 14:55   Dg Chest Port 1 View  Result Date: 08/04/2016 CLINICAL DATA:  Chest pain, shortness of Breath EXAM: PORTABLE CHEST 1 VIEW  COMPARISON:  10/25/2014 FINDINGS: Cardiomediastinal silhouette is stable. No infiltrate or pulmonary edema. Stable atelectasis or scarring in lingula. Mild elevation of the right hemidiaphragm again noted. IMPRESSION: No active disease. Stable atelectasis or scarring in lingula. Again noted mild elevation of the right hemidiaphragm. Electronically Signed   By: Lahoma Crocker M.D.   On: 08/04/2016 13:18     STUDIES:  CXR 3/7 > No active disease, stable atelectasis or scarring of lingula, noted mild elevation of the right hemidiaphragm  CTA Chest 3/7 > Bilateral PE with extensive on the right side, RV/LV ratio 3.2, 5 mm pulmonary nodule in the anterior right lung base, mild hazy attenuation within the bilateral perihilar regions could relate to mild edema  ECHO 3/7 >> BLE Dopplers 3/7 >>  CULTURES: None.   ANTIBIOTICS: None.   SIGNIFICANT EVENTS: 3/7 > Presents to ED with dyspnea   LINES/TUBES: None.   DISCUSSION: 71 year old male with remote history of prostate cancer s/p surgery and radiation presents to ED on 3/7 with progressive dyspnea. CTA revealed Bilateral PE with RV/LV strain. IR consulted and discussed with Dr. Pascal Lux - believed due to location of PE EKOS would not be beneficial.   ASSESSMENT / PLAN:  PULMONARY A: Acute Hypoxic Respiratory Failure secondary to bilateral PE  P:   BiPAP Maintain Saturation >92  CARDIOVASCULAR A:  Tachycardia  Elevated Trop - Demand Ischemia  H/O HTN  P:  Cardiac Monitoring  ECHO and dopplers pending  Maintain MAP >65 Trend Trop  PRN Metoprolol  Hold Home HTN meds   RENAL A:   Hyponatremia  P:   Trend BMP Replace Electrolytes as needed  NS @ 50 ml/hr  GASTROINTESTINAL A:   ?Nodules to Liver secondary to possible metastatic disease  P:   NPO PPI  HEMATOLOGIC A:   Pulmonary Embolism (? metastatic disease) H/O Prostate Cancer   -Lung and Liver lesions on CTA P:  After stable F/U with additional testing  PSA pending   Trend CBC  Heparin GTT per pharmacy  Trend APTT   INFECTIOUS A:   Leukocytosis  P:   Trend Fever and WBC curve   ENDOCRINE A:   Hyperglycemia  P:   Trend Glucose  SSI HA1C  NEUROLOGIC A:   No issues  P:   Monitor  Hold all sedating medications  RASS goal: 0    FAMILY  - Updates: Son updated at bedside   - Inter-disciplinary family meet or Palliative Care meeting due by:  3/14    CC Time: 27 minutes   Hayden Pedro, AG-ACNP [facility]  Pgr: [phone]  PCCM Pgr: [phone]

## 2016-08-04 NOTE — ED Notes (Signed)
Pt returned from CT °

## 2016-08-04 NOTE — ED Notes (Signed)
EKG given to Dr. Tyrone Nine

## 2016-08-04 NOTE — ED Provider Notes (Signed)
[facility] DEPT Provider Note   CSN: [id number] Arrival date & time: 08/04/16  1157     History   Chief Complaint Chief Complaint  Patient presents with  . Shortness of Breath    HPI Jared Clements is a 71 y.o. male.  71 yo M with a chief complaint of shortness of breath. The started acutely about an hour and 30 minutes ago. Patient states it's worse with exertion and better with rest. He is having difficulty speaking to me and is only able to talk in 1-2 words at a time. Level 5 caveat acuity of condition.   The history is provided by the patient.  Shortness of Breath  This is a new problem. The average episode lasts 1 hour. The problem occurs continuously.The current episode started less than 1 hour ago. The problem has been rapidly worsening. Associated symptoms include chest pain. Pertinent negatives include no fever, no headaches, no vomiting, no abdominal pain and no rash. He has tried nothing for the symptoms. The treatment provided no relief. He has had no prior hospitalizations. He has had no prior ED visits. Associated medical issues do not include PE, CAD, heart failure, past MI or DVT.    Past Medical History:  Diagnosis Date  . Cancer ([place])    prostates ca  . Hypertension     Patient Active Problem List   Diagnosis Date Noted  . Pulmonary embolism ([place]) 08/04/2016  . Dyspnea   . HLD (hyperlipidemia) 10/24/2014  . Acute pancreatitis 10/24/2014  . Abdominal pain, acute 10/24/2014  . Hypertension   . Essential hypertension   . AKI (acute kidney injury) ([place]) 09/15/2012  . Pyelonephritis 09/15/2012  . Hyponatremia 09/15/2012  . Hypokalemia 09/15/2012    Past Surgical History:  Procedure Laterality Date  . PROSTATE SURGERY         Home Medications    Prior to Admission medications   Medication Sig Start Date End Date Taking? Authorizing Provider  amLODipine (NORVASC) 10 MG tablet Take 1 tablet (10 mg total) by mouth daily. 10/28/14   Orson Eva, MD    diphenhydrAMINE (BENADRYL) 25 MG tablet Take 1 tablet (25 mg total) by mouth every 6 (six) hours as needed for itching. 10/21/14   Ellwood Dense, MD  hydrocortisone cream 1 % Apply to affected area on chest 2 times daily as needed for itching 10/21/14   Ellwood Dense, MD  metoprolol tartrate (LOPRESSOR) 25 MG tablet Take 1 tablet (25 mg total) by mouth 2 (two) times daily. 10/28/14   Orson Eva, MD    Family History Family History  Problem Relation Age of Onset  . Other Mother   . Other Father     Social History Social History  Substance Use Topics  . Smoking status: Never Smoker  . Smokeless tobacco: Never Used  . Alcohol use No     Allergies   Patient has no known allergies.   Review of Systems Review of Systems  Constitutional: Negative for chills and fever.  HENT: Negative for congestion and facial swelling.   Eyes: Negative for discharge and visual disturbance.  Respiratory: Positive for shortness of breath.   Cardiovascular: Positive for chest pain. Negative for palpitations.  Gastrointestinal: Negative for abdominal pain, diarrhea and vomiting.  Musculoskeletal: Negative for arthralgias and myalgias.  Skin: Negative for color change and rash.  Neurological: Negative for tremors, syncope and headaches.  Psychiatric/Behavioral: Negative for confusion and dysphoric mood.     Physical Exam Updated Vital Signs BP 126/92  Pulse 112   Resp (!) 39   Ht 5\' 10"  (1.778 m)   Wt 170 lb (77.1 kg)   SpO2 100%   BMI 24.39 kg/m   Physical Exam  Constitutional: He is oriented to person, place, and time. He appears well-developed and well-nourished.  HENT:  Head: Normocephalic and atraumatic.  Eyes: EOM are normal. Pupils are equal, round, and reactive to light.  Neck: Normal range of motion. Neck supple. No JVD present.  Cardiovascular: Regular rhythm.  Tachycardia present.  Exam reveals no gallop and no friction rub.   No murmur heard. Pulmonary/Chest: He is in respiratory  distress. He has no wheezes.  Tachypnea   Abdominal: He exhibits no distension. There is no rebound and no guarding.  Musculoskeletal: Normal range of motion.  Neurological: He is alert and oriented to person, place, and time.  Skin: No rash noted. No pallor.  Psychiatric: He has a normal mood and affect. His behavior is normal.  Nursing note and vitals reviewed.    ED Treatments / Results  Labs (all labs ordered are listed, but only abnormal results are displayed) Labs Reviewed  CBC - Abnormal; Notable for the following:       Result Value   WBC 19.4 (*)    RBC 4.09 (*)    Hemoglobin 11.4 (*)    HCT 34.7 (*)    All other components within normal limits  BASIC METABOLIC PANEL - Abnormal; Notable for the following:    Sodium 131 (*)    Chloride 98 (*)    CO2 18 (*)    Glucose, Bld 159 (*)    Calcium 8.5 (*)    All other components within normal limits  BRAIN NATRIURETIC PEPTIDE - Abnormal; Notable for the following:    B Natriuretic Peptide 606.0 (*)    All other components within normal limits  I-STAT TROPOININ, ED - Abnormal; Notable for the following:    Troponin i, poc 0.16 (*)    All other components within normal limits  I-STAT CHEM 8, ED - Abnormal; Notable for the following:    Sodium 130 (*)    Glucose, Bld 167 (*)    Calcium, Ion 0.89 (*)    Hemoglobin 12.6 (*)    HCT 37.0 (*)    All other components within normal limits  HEPARIN LEVEL (UNFRACTIONATED)  TROPONIN I  TROPONIN I  TROPONIN I  HEMOGLOBIN A1C  PSA    EKG  EKG Interpretation  Date/Time:  Wednesday August 04 2016 12:09:16 EST Ventricular Rate:  127 PR Interval:  158 QRS Duration: 90 QT Interval:  296 QTC Calculation: 430 R Axis:   23 Text Interpretation:  Sinus tachycardia with Fusion complexes Abnormal ECG Baseline wander Confirmed by Ranika Mcniel MD, DANIEL [phone]) on 08/04/2016 12:13:27 PM       Radiology Ct Angio Chest Pe W And/or Wo Contrast  Addendum Date: 08/04/2016   ADDENDUM REPORT:  08/04/2016 15:56 ADDENDUM: Additional clinical information, remote history of prostate cancer. Upon re-review of images, multiple vague low-attenuation lesions in the liver suspicious for metastatic lesions. Dedicated CT abdomen pelvis with contrast would better evaluate the liver lesions. Electronically Signed   By: Donavan Foil M.D.   On: 08/04/2016 15:56   Result Date: 08/04/2016 CLINICAL DATA:  Shortness of breath and chest pain EXAM: CT ANGIOGRAPHY CHEST WITH CONTRAST TECHNIQUE: Multidetector CT imaging of the chest was performed using the standard protocol during bolus administration of intravenous contrast. Multiplanar CT image reconstructions and MIPs were obtained  to evaluate the vascular anatomy. CONTRAST:  100 mL Isovue 370 intravenous COMPARISON:  Chest x-ray 08/04/2016, CT chest 04/12/2012 FINDINGS: Cardiovascular: Satisfactory opacification of the pulmonary arteries to the segmental level. Extensive filling defect within right lower lobe lobar, segmental, and subsegmental arteries. Additional filling defects within segmental and subsegmental branches of the right upper and middle lobe arterial branches. Filling defect also present within left upper lobe lobar, segmental and subsegmental branches with additional small filling defects present within segmental and subsegmental left lower lobe arterial branches. RV LV ratio elevated at 3.2. Small pericardial effusion. Mildly ectatic ascending aorta. Bovine arch variant. Atherosclerotic calcifications. Mediastinum/Nodes: Trachea is midline. Thyroid grossly unremarkable. Scattered subcentimeter lymph nodes in the mediastinum, grossly unchanged, for example precarinal lymph node measures 1 cm. Ill-defined low density within the bilateral hilar regions, uncertain if this is all due to thrombus. The esophagus is grossly unremarkable. Lungs/Pleura: Mild hazy density in the perihilar regions. No consolidation, effusion or pneumothorax. Within the anterior right  lung base, there is a 6 x 4 mm oval nodule, 5 mm average diameter, series 6, image number 100. Upper Abdomen: No acute abnormality in the upper abdomen, possible mildly nodular contour of the liver. Mild gynecomastia. Musculoskeletal: Degenerative changes of the spine. No acute or suspicious bone lesions. Review of the MIP images confirms the above findings. IMPRESSION: 1. The study is positive for acute bilateral pulmonary emboli, extensive on the right side. Positive for acute PE with CT evidence of right heart strain (RV/LV Ratio = 3.2) consistent with at least submassive (intermediate risk) PE. The presence of right heart strain has been associated with an increased risk of morbidity and mortality. Please activate Code PE by paging [phone]. 2. Ill-defined soft tissue density within the bilateral hilar regions, uncertain if this is all secondary to thrombus, suggest short interval CT follow-up to exclude hilar adenopathy or possible developing soft tissue density in the left greater than right hila. 3. 5 mm pulmonary nodule in the anterior right lung base. No follow-up needed if patient is low-risk. Non-contrast chest CT can be considered in 12 months if patient is high-risk. This recommendation follows the consensus statement: Guidelines for Management of Incidental Pulmonary Nodules Detected on CT Images: From the Fleischner Society 2017; Radiology 2017; 284:228-243. 4. Mild hazy attenuation within the bilateral perihilar regions could relate to mild edema. Critical Value/emergent results were called by telephone at the time of interpretation on 08/04/2016 at 2:55 pm to Dr. Deno Etienne , who verbally acknowledged these results. Electronically Signed: By: Donavan Foil M.D. On: 08/04/2016 14:55   Dg Chest Port 1 View  Result Date: 08/04/2016 CLINICAL DATA:  Chest pain, shortness of Breath EXAM: PORTABLE CHEST 1 VIEW COMPARISON:  10/25/2014 FINDINGS: Cardiomediastinal silhouette is stable. No infiltrate or  pulmonary edema. Stable atelectasis or scarring in lingula. Mild elevation of the right hemidiaphragm again noted. IMPRESSION: No active disease. Stable atelectasis or scarring in lingula. Again noted mild elevation of the right hemidiaphragm. Electronically Signed   By: Lahoma Crocker M.D.   On: 08/04/2016 13:18    Procedures Procedures (including critical care time)  Medications Ordered in ED Medications  nitroGLYCERIN (NITROSTAT) SL tablet 0.4 mg (not administered)  heparin ADULT infusion 100 units/mL (25000 units/282mL sodium chloride 0.45%) (900 Units/hr Intravenous New Bag/Given 08/04/16 1304)  0.9 %  sodium chloride infusion (not administered)  0.9 %  sodium chloride infusion (not administered)  metoprolol (LOPRESSOR) injection 5 mg (not administered)  pantoprazole (PROTONIX) injection 40 mg (not administered)  insulin aspart (novoLOG) injection 2-6 Units (not administered)  aspirin chewable tablet 324 mg (324 mg Oral Given 08/04/16 1242)  heparin bolus via infusion 4,000 Units (4,000 Units Intravenous Bolus from Bag 08/04/16 1300)  iopamidol (ISOVUE-370) 76 % injection (100 mLs  Contrast Given 08/04/16 1402)     Initial Impression / Assessment and Plan / ED Course  I have reviewed the triage vital signs and the nursing notes.  Pertinent labs & imaging results that were available during my care of the patient were reviewed by me and considered in my medical decision making (see chart for details).     72 yo M With a chief complaint of acute onset shortness of breath. Patient's EKG with some change of slope of his ST segments with diffuse depression this is likely rate related but I discussed it with the code STEMI physician who agreed to not called a STEMI at this time. Initial troponin was 0.16. I will start on heparin drip. Patient had recent travel to [place] and [place] by plane I will obtain a CT angios chest to evaluate for acute PE.  Saddle embolism. I discussed the case with  critical care who will admit.  CRITICAL CARE Performed by: Cecilio Asper   Total critical care time: 45 minutes  Critical care time was exclusive of separately billable procedures and treating other patients.  Critical care was necessary to treat or prevent imminent or life-threatening deterioration.  Critical care was time spent personally by me on the following activities: development of treatment plan with patient and/or surrogate as well as nursing, discussions with consultants, evaluation of patient's response to treatment, examination of patient, obtaining history from patient or surrogate, ordering and performing treatments and interventions, ordering and review of laboratory studies, ordering and review of radiographic studies, pulse oximetry and re-evaluation of patient's condition.  The patients results and plan were reviewed and discussed.   Any x-rays performed were independently reviewed by myself.   Differential diagnosis were considered with the presenting HPI.  Medications  nitroGLYCERIN (NITROSTAT) SL tablet 0.4 mg (not administered)  heparin ADULT infusion 100 units/mL (25000 units/257mL sodium chloride 0.45%) (900 Units/hr Intravenous New Bag/Given 08/04/16 1304)  0.9 %  sodium chloride infusion (not administered)  0.9 %  sodium chloride infusion (not administered)  metoprolol (LOPRESSOR) injection 5 mg (not administered)  pantoprazole (PROTONIX) injection 40 mg (not administered)  insulin aspart (novoLOG) injection 2-6 Units (not administered)  aspirin chewable tablet 324 mg (324 mg Oral Given 08/04/16 1242)  heparin bolus via infusion 4,000 Units (4,000 Units Intravenous Bolus from Bag 08/04/16 1300)  iopamidol (ISOVUE-370) 76 % injection (100 mLs  Contrast Given 08/04/16 1402)    Vitals:   08/04/16 1330 08/04/16 1345 08/04/16 1530 08/04/16 1600  BP: 111/74 114/79 117/85 126/92  Pulse: (!) 127 (!) 127 110 112  Resp: (!) 35 (!) 41 (!) 28 (!) 39  SpO2: 100% 100%  100% 100%  Weight:      Height:        Final diagnoses:  Chest pain  Pulmonary embolism ([place])    Admission/ observation were discussed with the admitting physician, patient and/or family and they are comfortable with the plan.    Final Clinical Impressions(s) / ED Diagnoses   Final diagnoses:  Chest pain  Pulmonary embolism [facility])    New Prescriptions New Prescriptions   No medications on file     Deno Etienne, DO 08/04/16 1615

## 2016-08-04 NOTE — ED Notes (Signed)
ED Provider at bedside. 

## 2016-08-04 NOTE — Progress Notes (Signed)
eLink Physician-Brief Progress Note Patient Name: Jared Clements DOB: 09/04/1945 MRN: [id number]   Date of Service  08/04/2016  HPI/Events of Note  Camera: no distress, on nimv BP awaited in icu, hr wnl CT reviewed On heparin ekos if worsen as last resort as risk bleeding with concern mets Monitor BP closely Ensure ehp ther   eICU Interventions       Intervention Category Evaluation Type: New Patient Evaluation  Raylene Miyamoto. 08/04/2016, 5:23 PM

## 2016-08-04 NOTE — ED Notes (Addendum)
Pt family at bedside

## 2016-08-04 NOTE — Progress Notes (Signed)
CRITICAL VALUE ALERT  Critical value received:  Troponin=3.31  Date of notification:  3/7  Time of notification:  2303  Critical value read back:Yes.    Nurse who received alert:  Jaynie Bream RN  MD notified (1st page):  yes  Responding MD: E link MD  Time MD responded: 2308

## 2016-08-04 NOTE — ED Triage Notes (Signed)
Pt presents to the ed with complaints of shortness of breath and chest pain since this am, breathing 50 times a minute and sats 70% on RA, edp at bedside and respiratory called for bipap

## 2016-08-04 NOTE — ED Notes (Signed)
This RN verified Heparin drip 4000u bolus and drip at 900u/hr.

## 2016-08-04 NOTE — ED Notes (Signed)
Pt CBG was 139, notified Jamie(RN)

## 2016-08-04 NOTE — ED Notes (Signed)
XR at bedside

## 2016-08-04 NOTE — ED Notes (Signed)
Attempted report 

## 2016-08-04 NOTE — Progress Notes (Signed)
Pt arrived to 2S03 from ED via stretcher. Pt attached to monitor revealing ST. Pt already on BiPap with RT at the bedside. O2 sats maintaining at 100%.  Pt bathed with CHG wipes and educated about unit equipment.  Family at bedside and educated about unit equipment and Fall prevention rules. Both pt and family verbalized understanding. Will continue to monitor pt closely.

## 2016-08-04 NOTE — ED Notes (Signed)
Respiratory at bedside placing pt on Bi Pap. Lab at bedside collecting blood.

## 2016-08-04 NOTE — Progress Notes (Signed)
[facility] for heparin Indication: chest pain/ACS  Heparin Dosing Weight: 77.1 kg   Assessment: 71 presenting with CP/SOB. Pharmacy consulted to start heparin for ACS. Not on anticoagulation PTA. Troponin 0.16, no CBC available yet. No bleed documented.  Goal of Therapy:  Heparin level 0.3-0.7 units/ml Monitor platelets by anticoagulation protocol: Yes   Plan:  Heparin 400 unit bolus Start heparin at 900 units/h 8h heparin level Daily heparin level/CBC Monitor s/sx bleeding   Elicia Lamp, PharmD, BCPS Clinical Pharmacist 08/04/2016 12:41 PM

## 2016-08-05 ENCOUNTER — Inpatient Hospital Stay (HOSPITAL_COMMUNITY): Payer: Medicaid Other

## 2016-08-05 DIAGNOSIS — R7989 Other specified abnormal findings of blood chemistry: Secondary | ICD-10-CM

## 2016-08-05 DIAGNOSIS — I2699 Other pulmonary embolism without acute cor pulmonale: Secondary | ICD-10-CM

## 2016-08-05 DIAGNOSIS — I2602 Saddle embolus of pulmonary artery with acute cor pulmonale: Principal | ICD-10-CM

## 2016-08-05 LAB — GLUCOSE, CAPILLARY
GLUCOSE-CAPILLARY: 94 mg/dL (ref 65–99)
GLUCOSE-CAPILLARY: 86 mg/dL (ref 65–99)
Glucose-Capillary: 108 mg/dL — ABNORMAL HIGH (ref 65–99)
Glucose-Capillary: 111 mg/dL — ABNORMAL HIGH (ref 65–99)
Glucose-Capillary: 181 mg/dL — ABNORMAL HIGH (ref 65–99)

## 2016-08-05 LAB — ECHOCARDIOGRAM COMPLETE
Height: 70 in
Weight: 2599.66 oz

## 2016-08-05 LAB — BASIC METABOLIC PANEL
ANION GAP: 11 (ref 5–15)
BUN: 15 mg/dL (ref 6–20)
CHLORIDE: 102 mmol/L (ref 101–111)
CO2: 19 mmol/L — AB (ref 22–32)
Calcium: 8 mg/dL — ABNORMAL LOW (ref 8.9–10.3)
Creatinine, Ser: 0.68 mg/dL (ref 0.61–1.24)
GFR calc Af Amer: >60 mL/min (ref >60)
GFR calc non Af Amer: >60 mL/min (ref >60)
GLUCOSE: 106 mg/dL — AB (ref 65–99)
POTASSIUM: 4.2 mmol/L (ref 3.5–5.1)
Sodium: 132 mmol/L — ABNORMAL LOW (ref 135–145)

## 2016-08-05 LAB — MAGNESIUM: Magnesium: 2.1 mg/dL (ref 1.7–2.4)

## 2016-08-05 LAB — HEPARIN LEVEL (UNFRACTIONATED)
Heparin Unfractionated: 0.37 IU/mL (ref 0.30–0.70)
Heparin Unfractionated: 0.33 IU/mL (ref 0.30–0.70)

## 2016-08-05 LAB — CBC
HEMATOCRIT: 32.9 % — AB (ref 39.0–52.0)
HEMOGLOBIN: 10.9 g/dL — AB (ref 13.0–17.0)
MCH: 27.7 pg (ref 26.0–34.0)
MCHC: 33.1 g/dL (ref 30.0–36.0)
MCV: 83.5 fL (ref 78.0–100.0)
Platelets: 300 10*3/uL (ref 150–400)
RBC: 3.94 MIL/uL — ABNORMAL LOW (ref 4.22–5.81)
RDW: 14.3 % (ref 11.5–15.5)
WBC: 13.6 10*3/uL — AB (ref 4.0–10.5)

## 2016-08-05 LAB — HEMOGLOBIN A1C
Hgb A1c MFr Bld: 6.2 % — ABNORMAL HIGH (ref 4.8–5.6)
Mean Plasma Glucose: 131 mg/dL

## 2016-08-05 LAB — TROPONIN I: TROPONIN I: 9.7 ng/mL — AB (ref <0.03)

## 2016-08-05 LAB — PHOSPHORUS: PHOSPHORUS: 3.3 mg/dL (ref 2.5–4.6)

## 2016-08-05 MED ORDER — PANTOPRAZOLE SODIUM 40 MG PO TBEC
40.0000 mg | DELAYED_RELEASE_TABLET | Freq: Every day | ORAL | Status: DC
  Administered 2016-08-05 – 2016-08-10 (×6): 40 mg via ORAL
  Filled 2016-08-05 (×7): qty 1

## 2016-08-05 NOTE — Progress Notes (Signed)
ANTICOAGULATION CONSULT NOTE - Follow Up Consult  Pharmacy Consult for Heparin Indication: chest pain/ACS and pulmonary embolus  No Known Allergies  Patient Measurements: Height: 5\' 10"  (177.8 cm) Weight: 162 lb 7.7 oz (73.7 kg) IBW/kg (Calculated) : 73 Heparin Dosing Weight: 77.1 kg  Vital Signs: Temp: 97.3 F (36.3 C) (03/08 0400) Temp Source: Axillary (03/08 0400) BP: 142/107 (03/08 0400) Pulse Rate: 100 (03/08 0400)  Labs:  Recent Labs  08/04/16 1228 08/04/16 1243 08/04/16 1646 08/04/16 2126 08/05/16 0454  HGB 11.4* 12.6*  --   --  10.9*  HCT 34.7* 37.0*  --   --  32.9*  PLT 288  --   --   --  300  HEPARINUNFRC  --   --   --  0.11* 0.37  CREATININE 0.94 0.80  --   --   --   TROPONINI  --   --  0.46* 3.31*  --     Estimated Creatinine Clearance: 84.9 mL/min (by C-G formula based on SCr of 0.8 mg/dL).  Assessment: 74 M on heparin for extensive bilateral PE with right heart strain and r/o ACS. Noted not a candidate for EKOS as he has multiple liver lesions suspicious for metastatic cancer. Heparin level therapeutic (0.37) on gtt at 1200 units/hr. Would aim for upper end of goal range with large clot burden. Hgb down a bit, plt stable. No bleeding noted.  Goal of Therapy:  Heparin level 0.3-0.7 units/ml Monitor platelets by anticoagulation protocol: Yes   Plan: . Increase heparin drip to 1300 units/hr  Will f/u 6 hr heparin level  Sherlon Handing, PharmD, BCPS Clinical pharmacist, pager [phone] 08/05/2016,5:54 AM

## 2016-08-05 NOTE — Progress Notes (Signed)
Pt arrived to 2w from 2s. Telemetry box applied and CCMD notified. Pt oriented to room. Vitals obtained. Will continue current plan of care.   Grant Fontana BSN, RN

## 2016-08-05 NOTE — Progress Notes (Signed)
CRITICAL VALUE ALERT  Critical value received:  Troponin 9.3  Date of notification:  2/8  Time of notification:  0615   Critical value read back:Yes.    Nurse who received alert:  [employer]    Responding MD:  Warren Lacy MD

## 2016-08-05 NOTE — Progress Notes (Signed)
  Echocardiogram 2D Echocardiogram has been performed.  Tresa Res 08/05/2016, 2:44 PM

## 2016-08-05 NOTE — Progress Notes (Signed)
Patient taken off of bipap and placed on 3L nasal cannula.  Currently tolerating well.  Will continue to monitor. 

## 2016-08-05 NOTE — Progress Notes (Signed)
ANTICOAGULATION CONSULT NOTE - Follow Up Consult  Pharmacy Consult for Heparin Indication: pulmonary embolus  No Known Allergies  Patient Measurements: Height: 5\' 10"  (177.8 cm) Weight: 162 lb 7.7 oz (73.7 kg) IBW/kg (Calculated) : 73 Heparin Dosing Weight:  77.1 kg  Vital Signs: Temp: 97.8 F (36.6 C) (03/08 1505) Temp Source: Oral (03/08 1505) BP: 129/76 (03/08 1505) Pulse Rate: 109 (03/08 1505)  Labs:  Recent Labs  08/04/16 1228 08/04/16 1243 08/04/16 1646 08/04/16 2126 08/05/16 0454 08/05/16 1420  HGB 11.4* 12.6*  --   --  10.9*  --   HCT 34.7* 37.0*  --   --  32.9*  --   PLT 288  --   --   --  300  --   HEPARINUNFRC  --   --   --  0.11* 0.37 0.33  CREATININE 0.94 0.80  --   --  0.68  --   TROPONINI  --   --  0.46* 3.31* 9.70*  --     Estimated Creatinine Clearance: 84.9 mL/min (by C-G formula based on SCr of 0.68 mg/dL).   Assessment: Anticoag: CTA positive for B PE with R heart strain. Not an EKOS candidate d/t multiple liver lesions suspicious for metastatic cancer. HL 0.37. Hgb 10.9 (baseline 11.4), Plts 300 stable. Repeat HL 0.33  Goal of Therapy:  Heparin level 0.3-0.7 units/ml  Would keep higher end. Monitor platelets by anticoagulation protocol: Yes   Plan:  Increase IV heparin to 1400 units/hr so does not fall < goal range by AM. Daily HL, CBC   Jared Clements, PharmD, BCPS Clinical Staff Pharmacist Pager [phone]  Jared Clements 08/05/2016,3:25 PM

## 2016-08-05 NOTE — H&P (Signed)
PULMONARY / CRITICAL CARE MEDICINE   Name: Jared Clements MRN: [id number] DOB: [birth year]    ADMISSION DATE:  08/04/2016 CONSULTATION DATE:  08/04/2016  REFERRING MD:  Dr. Vanessa Ralphs   CHIEF COMPLAINT:  Dyspnea   HISTORY OF PRESENT ILLNESS:   71 year old male with PMH of prostate cancer s/p surgery and radiation in 2013 and HTN. Presents to ED on 3/7 with progressive dyspnea since this morning. Upon arrival to ED patient was tachypneic, tachycardiac, and hypoxic and placed on BIPAP. CTA Chest revealed bilateral pulmonary emboli extensive on the right side, with right heart strain with RV/LV Ratio of 3.2. Heparin GTT began, PCCM was asked to admit.   SUBJECTIVE:  Hep , no bleeding, no pressors, off BIPAP, no distress  VITAL SIGNS: BP 131/88   Pulse 98   Temp 97.7 F (36.5 C) (Axillary)   Resp (!) 23   Ht 5\' 10"  (1.778 m)   Wt 73.7 kg (162 lb 7.7 oz)   SpO2 100%   BMI 23.31 kg/m   HEMODYNAMICS:    VENTILATOR SETTINGS: FiO2 (%):  [30 %] 30 %  INTAKE / OUTPUT: I/O last 3 completed shifts: In: 800.4 [I.V.:800.4] Out: 600 [Urine:600]  PHYSICAL EXAMINATION: General:  Adult male, no distress Neuro:  Alert, follows commands, moves extremities  HEENT:  Normocephalic  Cardiovascular:  Tachy mild 103, Regular Rhythm, no MRG  Lungs:  Reduced, cta Abdomen:  Non-distended, active bowel sounds  Musculoskeletal:  No acute Skin:  Warm, dry, intact   LABS:  BMET  Recent Labs Lab 08/04/16 1228 08/04/16 1243 08/05/16 0454  NA 131* 130* 132*  K 3.8 4.2 4.2  CL 98* 102 102  CO2 18*  --  19*  BUN 14 18 15   CREATININE 0.94 0.80 0.68  GLUCOSE 159* 167* 106*    Electrolytes  Recent Labs Lab 08/04/16 1228 08/05/16 0454  CALCIUM 8.5* 8.0*  MG  --  2.1  PHOS  --  3.3    CBC  Recent Labs Lab 08/04/16 1228 08/04/16 1243 08/05/16 0454  WBC 19.4*  --  13.6*  HGB 11.4* 12.6* 10.9*  HCT 34.7* 37.0* 32.9*  PLT 288  --  300    Coag's No results for input(s): APTT, INR in  the last 168 hours.  Sepsis Markers No results for input(s): LATICACIDVEN, PROCALCITON, O2SATVEN in the last 168 hours.  ABG No results for input(s): PHART, PCO2ART, PO2ART in the last 168 hours.  Liver Enzymes No results for input(s): AST, ALT, ALKPHOS, BILITOT, ALBUMIN in the last 168 hours.  Cardiac Enzymes  Recent Labs Lab 08/04/16 1646 08/04/16 2126 08/05/16 0454  TROPONINI 0.46* 3.31* 9.70*    Glucose  Recent Labs Lab 08/04/16 1627 08/04/16 1925 08/04/16 2332 08/05/16 0408 08/05/16 0734  GLUCAP 139* 130* 127* 108* 94    Imaging Ct Angio Chest Pe W And/or Wo Contrast  Addendum Date: 08/04/2016   ADDENDUM REPORT: 08/04/2016 15:56 ADDENDUM: Additional clinical information, remote history of prostate cancer. Upon re-review of images, multiple vague low-attenuation lesions in the liver suspicious for metastatic lesions. Dedicated CT abdomen pelvis with contrast would better evaluate the liver lesions. Electronically Signed   By: Donavan Foil M.D.   On: 08/04/2016 15:56   Result Date: 08/04/2016 CLINICAL DATA:  Shortness of breath and chest pain EXAM: CT ANGIOGRAPHY CHEST WITH CONTRAST TECHNIQUE: Multidetector CT imaging of the chest was performed using the standard protocol during bolus administration of intravenous contrast. Multiplanar CT image reconstructions and MIPs were obtained to  evaluate the vascular anatomy. CONTRAST:  100 mL Isovue 370 intravenous COMPARISON:  Chest x-ray 08/04/2016, CT chest 04/12/2012 FINDINGS: Cardiovascular: Satisfactory opacification of the pulmonary arteries to the segmental level. Extensive filling defect within right lower lobe lobar, segmental, and subsegmental arteries. Additional filling defects within segmental and subsegmental branches of the right upper and middle lobe arterial branches. Filling defect also present within left upper lobe lobar, segmental and subsegmental branches with additional small filling defects present within  segmental and subsegmental left lower lobe arterial branches. RV LV ratio elevated at 3.2. Small pericardial effusion. Mildly ectatic ascending aorta. Bovine arch variant. Atherosclerotic calcifications. Mediastinum/Nodes: Trachea is midline. Thyroid grossly unremarkable. Scattered subcentimeter lymph nodes in the mediastinum, grossly unchanged, for example precarinal lymph node measures 1 cm. Ill-defined low density within the bilateral hilar regions, uncertain if this is all due to thrombus. The esophagus is grossly unremarkable. Lungs/Pleura: Mild hazy density in the perihilar regions. No consolidation, effusion or pneumothorax. Within the anterior right lung base, there is a 6 x 4 mm oval nodule, 5 mm average diameter, series 6, image number 100. Upper Abdomen: No acute abnormality in the upper abdomen, possible mildly nodular contour of the liver. Mild gynecomastia. Musculoskeletal: Degenerative changes of the spine. No acute or suspicious bone lesions. Review of the MIP images confirms the above findings. IMPRESSION: 1. The study is positive for acute bilateral pulmonary emboli, extensive on the right side. Positive for acute PE with CT evidence of right heart strain (RV/LV Ratio = 3.2) consistent with at least submassive (intermediate risk) PE. The presence of right heart strain has been associated with an increased risk of morbidity and mortality. Please activate Code PE by paging [phone]. 2. Ill-defined soft tissue density within the bilateral hilar regions, uncertain if this is all secondary to thrombus, suggest short interval CT follow-up to exclude hilar adenopathy or possible developing soft tissue density in the left greater than right hila. 3. 5 mm pulmonary nodule in the anterior right lung base. No follow-up needed if patient is low-risk. Non-contrast chest CT can be considered in 12 months if patient is high-risk. This recommendation follows the consensus statement: Guidelines for Management  of Incidental Pulmonary Nodules Detected on CT Images: From the Fleischner Society 2017; Radiology 2017; 284:228-243. 4. Mild hazy attenuation within the bilateral perihilar regions could relate to mild edema. Critical Value/emergent results were called by telephone at the time of interpretation on 08/04/2016 at 2:55 pm to Dr. Deno Etienne , who verbally acknowledged these results. Electronically Signed: By: Donavan Foil M.D. On: 08/04/2016 14:55   Dg Chest Port 1 View  Result Date: 08/04/2016 CLINICAL DATA:  Chest pain, shortness of Breath EXAM: PORTABLE CHEST 1 VIEW COMPARISON:  10/25/2014 FINDINGS: Cardiomediastinal silhouette is stable. No infiltrate or pulmonary edema. Stable atelectasis or scarring in lingula. Mild elevation of the right hemidiaphragm again noted. IMPRESSION: No active disease. Stable atelectasis or scarring in lingula. Again noted mild elevation of the right hemidiaphragm. Electronically Signed   By: Lahoma Crocker M.D.   On: 08/04/2016 13:18     STUDIES:  CXR 3/7 > No active disease, stable atelectasis or scarring of lingula, noted mild elevation of the right hemidiaphragm  CTA Chest 3/7 > Bilateral PE with extensive on the right side, RV/LV ratio 3.2, 5 mm pulmonary nodule in the anterior right lung base, mild hazy attenuation within the bilateral perihilar regions could relate to mild edema  ECHO 3/7 >> BLE Dopplers 3/7 >>  CULTURES: None.   ANTIBIOTICS:  None.   SIGNIFICANT EVENTS: 3/7 > Presents to ED with dyspnea   LINES/TUBES: None.   DISCUSSION: 71 year old male with remote history of prostate cancer s/p surgery and radiation presents to ED on 3/7 with progressive dyspnea. CTA revealed Bilateral PE with RV/LV strain. IR consulted and discussed with Dr. Pascal Lux - believed due to location of PE EKOS would not be beneficial.   ASSESSMENT / PLAN:  PULMONARY A: Acute Hypoxic Respiratory Failure secondary to bilateral PE  P:   BiPAP dc not needed Maintain Saturation  >92 No EKOS as made clinical progress and high risk bleed  CARDIOVASCULAR A:  Tachycardia mild improved Elevated Trop - from PE H/O HTN  P:  Keep tele Hep Echo dopplers  RENAL A:   Hyponatremia  P:   Trend BMP in am  NS @ 50 ml/hr- keep  GASTROINTESTINAL A:   ?Nodules to Liver secondary to possible metastatic disease  P:   PPI Start diet  HEMATOLOGIC A:   Pulmonary Embolism (? metastatic disease) H/O Prostate Cancer   -Lung and Liver lesions on CTA P:  Heparin drip x 24 hrs more to ensure not decline in any manner and need ekos In am likely oral therapy  INFECTIOUS A:   Leukocytosis  P:   Trend Fever and WBC curve   ENDOCRINE A:   Hyperglycemia  P:   Trend Glucose  SSI  NEUROLOGIC A:   No issues  P:   Monitor  Hold all sedating medications  RASS goal: 0    FAMILY  - Updates: Update pt  - Inter-disciplinary family meet or Palliative Care meeting due by:  3/14   To tele, triad  Lavon Paganini. Titus Mould, MD, [place] P[facility]: [facility]

## 2016-08-05 NOTE — Progress Notes (Signed)
*  PRELIMINARY RESULTS* Vascular Ultrasound Bilateral lower extremity venous duplex has been completed.  Preliminary findings: No evidence of deep vein thrombosis or baker's cyst bilaterally.  Bilateral lesser saphenous veins are positive for superficial vein thrombosis.   Everrett Coombe 08/05/2016, 11:19 AM

## 2016-08-06 ENCOUNTER — Encounter (HOSPITAL_COMMUNITY): Payer: Self-pay | Admitting: Internal Medicine

## 2016-08-06 ENCOUNTER — Inpatient Hospital Stay (HOSPITAL_COMMUNITY): Payer: Medicaid Other

## 2016-08-06 DIAGNOSIS — D638 Anemia in other chronic diseases classified elsewhere: Secondary | ICD-10-CM

## 2016-08-06 DIAGNOSIS — E440 Moderate protein-calorie malnutrition: Secondary | ICD-10-CM | POA: Diagnosis present

## 2016-08-06 DIAGNOSIS — I248 Other forms of acute ischemic heart disease: Secondary | ICD-10-CM

## 2016-08-06 DIAGNOSIS — E871 Hypo-osmolality and hyponatremia: Secondary | ICD-10-CM

## 2016-08-06 DIAGNOSIS — J9601 Acute respiratory failure with hypoxia: Secondary | ICD-10-CM | POA: Diagnosis present

## 2016-08-06 DIAGNOSIS — I700 Atherosclerosis of aorta: Secondary | ICD-10-CM

## 2016-08-06 DIAGNOSIS — R778 Other specified abnormalities of plasma proteins: Secondary | ICD-10-CM | POA: Diagnosis present

## 2016-08-06 DIAGNOSIS — I10 Essential (primary) hypertension: Secondary | ICD-10-CM

## 2016-08-06 DIAGNOSIS — R911 Solitary pulmonary nodule: Secondary | ICD-10-CM

## 2016-08-06 DIAGNOSIS — R7301 Impaired fasting glucose: Secondary | ICD-10-CM

## 2016-08-06 DIAGNOSIS — R748 Abnormal levels of other serum enzymes: Secondary | ICD-10-CM

## 2016-08-06 DIAGNOSIS — Z8546 Personal history of malignant neoplasm of prostate: Secondary | ICD-10-CM

## 2016-08-06 DIAGNOSIS — R7989 Other specified abnormal findings of blood chemistry: Secondary | ICD-10-CM

## 2016-08-06 DIAGNOSIS — R7303 Prediabetes: Secondary | ICD-10-CM

## 2016-08-06 HISTORY — DX: Solitary pulmonary nodule: R91.1

## 2016-08-06 HISTORY — DX: Atherosclerosis of aorta: I70.0

## 2016-08-06 HISTORY — DX: Impaired fasting glucose: R73.01

## 2016-08-06 LAB — CBC
HCT: 27.6 % — ABNORMAL LOW (ref 39.0–52.0)
Hemoglobin: 9.1 g/dL — ABNORMAL LOW (ref 13.0–17.0)
MCH: 27.4 pg (ref 26.0–34.0)
MCHC: 33 g/dL (ref 30.0–36.0)
MCV: 83.1 fL (ref 78.0–100.0)
PLATELETS: 299 10*3/uL (ref 150–400)
RBC: 3.32 MIL/uL — ABNORMAL LOW (ref 4.22–5.81)
RDW: 14.3 % (ref 11.5–15.5)
WBC: 12.6 10*3/uL — AB (ref 4.0–10.5)

## 2016-08-06 LAB — BASIC METABOLIC PANEL
ANION GAP: 8 (ref 5–15)
BUN: 13 mg/dL (ref 6–20)
CALCIUM: 7.8 mg/dL — AB (ref 8.9–10.3)
CO2: 21 mmol/L — ABNORMAL LOW (ref 22–32)
CREATININE: 0.63 mg/dL (ref 0.61–1.24)
Chloride: 103 mmol/L (ref 101–111)
Glucose, Bld: 105 mg/dL — ABNORMAL HIGH (ref 65–99)
Potassium: 3.7 mmol/L (ref 3.5–5.1)
SODIUM: 132 mmol/L — AB (ref 135–145)

## 2016-08-06 LAB — LIPID PANEL
Cholesterol: 110 mg/dL (ref 0–200)
HDL: 20 mg/dL — ABNORMAL LOW (ref >40)
LDL CALC: 70 mg/dL (ref 0–99)
Total CHOL/HDL Ratio: 5.5 RATIO
Triglycerides: 99 mg/dL (ref <150)
VLDL: 20 mg/dL (ref 0–40)

## 2016-08-06 LAB — IRON AND TIBC
Iron: 18 ug/dL — ABNORMAL LOW (ref 45–182)
Saturation Ratios: 9 % — ABNORMAL LOW (ref 17.9–39.5)
TIBC: 203 ug/dL — ABNORMAL LOW (ref 250–450)
UIBC: 185 ug/dL

## 2016-08-06 LAB — GLUCOSE, CAPILLARY
GLUCOSE-CAPILLARY: 85 mg/dL (ref 65–99)
GLUCOSE-CAPILLARY: 107 mg/dL — AB (ref 65–99)
Glucose-Capillary: 102 mg/dL — ABNORMAL HIGH (ref 65–99)
Glucose-Capillary: 89 mg/dL (ref 65–99)
Glucose-Capillary: 90 mg/dL (ref 65–99)
Glucose-Capillary: 98 mg/dL (ref 65–99)

## 2016-08-06 LAB — HEPARIN LEVEL (UNFRACTIONATED)
HEPARIN UNFRACTIONATED: 0.28 [IU]/mL — AB (ref 0.30–0.70)
HEPARIN UNFRACTIONATED: 0.51 [IU]/mL (ref 0.30–0.70)

## 2016-08-06 LAB — FERRITIN: FERRITIN: 899 ng/mL — AB (ref 24–336)

## 2016-08-06 LAB — VITAMIN B12: Vitamin B-12: 4482 pg/mL — ABNORMAL HIGH (ref 180–914)

## 2016-08-06 MED ORDER — INSULIN ASPART 100 UNIT/ML ~~LOC~~ SOLN: 0.0000 [IU] | Freq: Three times a day (TID) | SUBCUTANEOUS | Status: DC

## 2016-08-06 MED ORDER — INSULIN ASPART 100 UNIT/ML ~~LOC~~ SOLN: 0.0000 [IU] | Freq: Every day | SUBCUTANEOUS | Status: DC

## 2016-08-06 MED ORDER — IOPAMIDOL (ISOVUE-300) INJECTION 61%
100.0000 mL | Freq: Once | INTRAVENOUS | Status: AC | PRN
  Administered 2016-08-06: 100 mL via INTRAVENOUS

## 2016-08-06 NOTE — Care Management Note (Signed)
Case Management Note  Patient Details  Name: Jared Clements MRN: [id number] Date of Birth: 10-13-1945  Subjective/Objective:                 Spoke with patient and son at the bedside. Provided with Medicare B card and stated that patient has [facility]. Placed copy of Medicare and old Medicaid cards in chart. Patient on hep gtt, WATCH FOR ANTICOAG needs at DC. Patient moved from [place] 3 days ago.   Action/Plan:   Expected Discharge Date:                  Expected Discharge Plan:  [place]  In-House Referral:     Discharge planning Services  CM Consult  Post Acute Care Choice:    Choice offered to:     DME Arranged:    DME Agency:     HH Arranged:    [place] Agency:     Status of Service:  In process, will continue to follow  If discussed at Long Length of Stay Meetings, dates discussed:    Additional Comments:  Carles Collet, RN 08/06/2016, 3:04 PM

## 2016-08-06 NOTE — Progress Notes (Signed)
ANTICOAGULATION CONSULT NOTE - Follow Up Consult  Pharmacy Consult for Heparin Indication: pulmonary embolus  No Known Allergies  Patient Measurements: Height: 5\' 10"  (177.8 cm) Weight: 162 lb 7.7 oz (73.7 kg) IBW/kg (Calculated) : 73 Heparin Dosing Weight:  77.1 kg  Vital Signs: Temp: 97.3 F (36.3 C) (03/08 2030) Temp Source: Oral (03/08 2030) BP: 122/77 (03/08 2030) Pulse Rate: 103 (03/08 2030)  Labs:  Recent Labs  08/04/16 1228 08/04/16 1243 08/04/16 1646  08/04/16 2126 08/05/16 0454 08/05/16 1420 08/06/16 0207  HGB 11.4* 12.6*  --   --   --  10.9*  --  9.1*  HCT 34.7* 37.0*  --   --   --  32.9*  --  27.6*  PLT 288  --   --   --   --  300  --  299  HEPARINUNFRC  --   --   --   < > 0.11* 0.37 0.33 0.28*  CREATININE 0.94 0.80  --   --   --  0.68  --  0.63  TROPONINI  --   --  0.46*  --  3.31* 9.70*  --   --   < > = values in this interval not displayed.  Estimated Creatinine Clearance: 84.9 mL/min (by C-G formula based on SCr of 0.63 mg/dL).   Assessment: Anticoag: CTA positive for B/L PE with R heart strain. Not an EKOS candidate d/t multiple liver lesions suspicious for metastatic cancer. Heparin level slightly subtherapeutic (0.28) on gtt at 1400 units/hr. Hgb down to 9.1, plt ok. No issues with line or bleeding reported per RN.  Goal of Therapy:  Heparin level 0.3-0.7 units/ml  Would keep at higher end. Monitor platelets by anticoagulation protocol: Yes   Plan:  Increase IV heparin to 1600 units/hr  Will f/u 8 hr heparin level  Sherlon Handing, PharmD, BCPS Clinical pharmacist, pager [phone] 08/06/2016,3:06 AM

## 2016-08-06 NOTE — Progress Notes (Addendum)
Initial Nutrition Assessment  DOCUMENTATION CODES:   Non-severe (moderate) malnutrition in context of chronic illness  INTERVENTION:    Boost Breeze po TID, each supplement provides 250 kcal and 9 grams of protein  NUTRITION DIAGNOSIS:   Malnutrition related to catabolic illness as evidenced by moderate depletions of muscle mass, moderate depletion of body fat  GOAL:   Patient will meet greater than or equal to 90% of their needs  MONITOR:   PO intake, Supplement acceptance, Labs, Weight trends, I & O's  REASON FOR ASSESSMENT:   Consult Assessment of nutrition requirement/status   ASSESSMENT:   71 year old Male with PMH of prostate cancer s/p surgery and radiation in 2013 and HTN. Presents to ED on 3/7 with progressive dyspnea since this morning. Upon arrival to ED patient was tachypneic, tachycardiac, and hypoxic and placed on BIPAP. CTA Chest revealed bilateral pulmonary emboli extensive on the right side, with right heart strain with RV/LV Ratio of 3.2. Heparin GTT began, PCCM was asked to admit.   RD spoke with pt's son at bedside. Reports pt lives with him; meals are mostly cooked at home. PO intake has been poor at 10-25% per flowsheet records. Would benefit from oral nutrition supplements. Labs reviewed.  Sodium 132 (L).  Calcium 7.8 (L). CBG's 90-85-107.  Noted pt has multiple liver lesions suspicious for metastatic cancer.  Also consulted for pre-diabetes diet education.  Heart Healthy Consistent Carbohydrate Nutrition Therapy handout provided. Nutrition-Focused physical exam completed. Findings are mild fat depletion, mild muscle depletion, and no edema.   Diet Order:  Diet Carb Modified Fluid consistency: Thin; Room service appropriate? Yes  Skin:  Reviewed, no issues  Last BM:  3/7  Height:   Ht Readings from Last 1 Encounters:  08/04/16 5\' 10"  (1.778 m)    Weight:   Wt Readings from Last 10 Encounters:  08/06/16 164 lb (74.4 kg)  10/28/14 167 lb  14.1 oz (76.1 kg)  10/21/14 170 lb (77.1 kg)  09/15/12 163 lb 8 oz (74.2 kg)   Ideal Body Weight:  75.4 kg  BMI:  Body mass index is 23.53 kg/m.  Estimated Nutritional Needs:   Kcal:  1700-1900  Protein:  80-90 gm  Fluid:  1.7-1.9 L  EDUCATION NEEDS:   Education needs addressed  Arthur Holms, RD, LDN Pager #: [phone] After-Hours Pager #: [phone]

## 2016-08-06 NOTE — Progress Notes (Signed)
ANTICOAGULATION CONSULT NOTE - Follow Up Consult  Pharmacy Consult for Heparin Indication: pulmonary embolus  No Known Allergies  Patient Measurements: Height: 5\' 10"  (177.8 cm) Weight: 164 lb (74.4 kg) IBW/kg (Calculated) : 73 Heparin Dosing Weight:  77.1 kg  Vital Signs: Temp: 98.4 F (36.9 C) (03/09 1332) Temp Source: Oral (03/09 1332) BP: 123/74 (03/09 1332) Pulse Rate: 101 (03/09 1332)  Labs:  Recent Labs  08/04/16 1228 08/04/16 1243 08/04/16 1646  08/04/16 2126 08/05/16 0454 08/05/16 1420 08/06/16 0207 08/06/16 1435  HGB 11.4* 12.6*  --   --   --  10.9*  --  9.1*  --   HCT 34.7* 37.0*  --   --   --  32.9*  --  27.6*  --   PLT 288  --   --   --   --  300  --  299  --   HEPARINUNFRC  --   --   --   < > 0.11* 0.37 0.33 0.28* 0.51  CREATININE 0.94 0.80  --   --   --  0.68  --  0.63  --   TROPONINI  --   --  0.46*  --  3.31* 9.70*  --   --   --   < > = values in this interval not displayed.  Estimated Creatinine Clearance: 84.9 mL/min (by C-G formula based on SCr of 0.63 mg/dL).   Assessment: Anticoag: CTA positive for B/L PE with R heart strain. Not an EKOS candidate d/t multiple liver lesions suspicious for metastatic cancer. Pharmacy dosing heparin -heparin level= 0.51  Goal of Therapy:  Heparin level 0.3-0.7 units/ml  Would keep at higher end. Monitor platelets by anticoagulation protocol: Yes   Plan:  -No heparin changes needed -Daily heparin level and CBC -Will follow anticoagulation plans  Hildred Laser, Pharm D 08/06/2016 3:17 PM

## 2016-08-06 NOTE — Progress Notes (Signed)
Progress Note    Jared Clements  ASN:[id number] DOB: April 24, 1946  DOA: 08/04/2016 PCP: Harvie Junior, MD    Brief Narrative:   Chief complaint: Follow-up dyspnea  Medical records reviewed and are as summarized below.  Jared Clements is an 71 y.o. male with a PMH of prostate cancer status post prostatectomy and radiation therapy in 2013, hospital admission in 2016 for acute pancreatitis of unclear origin, hospital admission in 2014 for treatment of Escherichia coli acute pyelonephritis, hypertension who was admitted 08/04/16 by the critical care team for evaluation of hypoxic respiratory failure requiring BiPAP. CT angiogram of the chest showed bilateral pulmonary emboli with right heart strain.  Assessment/Plan:   Principal Problem:   Acute saddle pulmonary embolism with acute cor pulmonale (HCC) with acute hypoxic respiratory failure Initially required BiPAP. CTA Chest (Personally reviewed) revealed bilateral pulmonary emboli extensive on the right side, with right heart strain with RV/LV Ratio of 3.2. Continue heparin drip. Follow-up 2-D echocardiogram. PSA not elevated, but given pulmonary embolism, pulmonary nodule, and liver nodularity there is a concern for metastatic prostate cancer. Can likely transition to oral blood thinners, but will need case management to evaluate insurance situation to see if he qualifies for a NOAC.He will likely need a PET scan post discharge.   Active Problems:   Troponin elevation/demand ischemia Significant bump in troponin. Follow-up echocardiogram. Heparinized at present. Likely demand ischemia in the setting of extensive pulmonary emboli. 12-lead EKG personally reviewed and showed nonspecific repolarization abnormalities in the inferior lead and minimal ST elevations in the lateral leads. Patient is at risk for heart disease given his aortic calcifications, history of hypertension and hyperlipidemia, age, and prediabetes. Will need a stress test when he is  more medically stable.    Hyponatremia Mild. Monitor.    HLD (hyperlipidemia) Not on medication. Check fasting lipid panel.    Essential hypertension Managed with amlodipine and Norvasc at home. Antihypertensives currently on hold. Blood pressure currently controlled.    History of prostate cancer Diagnosed in 2013, status post surgical resection and radiation therapy.    Aortic calcification (HCC) Checked fasting lipid panel: Cholesterol 110, LDL 70. Heparinized, so would not put on aspirin at this time.    Solitary pulmonary nodule/liver nodularity  3. 5 mm pulmonary nodule in the anterior right lung base and liver nodularity noted on CT. No follow-up of the pulmonary nodule needed if patient is low-risk. Non-contrast chest CT can be considered in 12 months if patient is high-risk. Patient is a nonsmoker, but he does have a history of prostate cancer. We'll get a CT of the abdomen and pelvis to look for metastatic disease. Will need an outpatient PET scan.    Normocytic anemia/anemia of chronic disease Ordered B-12 and iron studies. B-12 4482, ferritin 899, iron 18, TIBC 203. Findings are consistent with anemia of chronic disease.    Impaired fasting glucose/prediabetes Hemoglobin A1c is 6.2%, corresponding with prediabetes. Will have dietitian counsel the patient.  Moderate malnutrition Body mass index is 23.53 kg/m. Evaluated by dietitian. Continue nutritional supplements.   Family Communication/Anticipated D/C date and plan/Code Status   DVT prophylaxis: Heparin. Code Status: Full Code.  Family Communication: Son updated at the bedside. Disposition Plan: Home when switched to oral blood thinners and hemodynamically stable.   Medical Consultants:    Pulmonology   Procedures:    None  Anti-Infectives:    None  Subjective:   Jared Clements continues to have some shortness of breath. No bleeding. No  chest pain.  Objective:    Vitals:   08/05/16 1300 08/05/16  1505 08/05/16 2030 08/06/16 0414  BP: 126/82 129/76 122/77 119/76  Pulse: (!) 104 (!) 109 (!) 103 (!) 107  Resp: (!) 29 18 18 18   Temp:  97.8 F (36.6 C) 97.3 F (36.3 C) 97.8 F (36.6 C)  TempSrc:  Oral Oral Oral  SpO2: 100% 100% 100% 100%  Weight:    74.4 kg (164 lb)  Height:        Intake/Output Summary (Last 24 hours) at 08/06/16 0744 Last data filed at 08/06/16 0728  Gross per 24 hour  Intake              908 ml  Output              675 ml  Net              233 ml   Filed Weights   08/04/16 1217 08/05/16 0500 08/06/16 0414  Weight: 77.1 kg (170 lb) 73.7 kg (162 lb 7.7 oz) 74.4 kg (164 lb)    Exam: General exam: Appears calm and comfortable. Cachectic appearing. Respiratory system: Bibasilar crackles. Respiratory effort normal. Cardiovascular system: S1 & S2 heard, RRR. No JVD,  rubs, gallops or clicks. No murmurs. Gastrointestinal system: Abdomen is nondistended, soft and nontender. No organomegaly or masses felt. Normal bowel sounds heard. Central nervous system: Alert and oriented. No focal neurological deficits. Extremities: No clubbing,  or cyanosis. No edema. Skin: No rashes, lesions or ulcers. Psychiatry: Judgement and insight appear normal. Mood & affect flat.   Data Reviewed:   I have personally reviewed following labs and imaging studies:  Labs: Basic Metabolic Panel:  Recent Labs Lab 08/04/16 1228 08/04/16 1243 08/05/16 0454 08/06/16 0207  NA 131* 130* 132* 132*  K 3.8 4.2 4.2 3.7  CL 98* 102 102 103  CO2 18*  --  19* 21*  GLUCOSE 159* 167* 106* 105*  BUN 14 18 15 13   CREATININE 0.94 0.80 0.68 0.63  CALCIUM 8.5*  --  8.0* 7.8*  MG  --   --  2.1  --   PHOS  --   --  3.3  --    GFR Estimated Creatinine Clearance: 84.9 mL/min (by C-G formula based on SCr of 0.63 mg/dL). Liver Function Tests: No results for input(s): AST, ALT, ALKPHOS, BILITOT, PROT, ALBUMIN in the last 168 hours. No results for input(s): LIPASE, AMYLASE in the last 168  hours. No results for input(s): AMMONIA in the last 168 hours. Coagulation profile No results for input(s): INR, PROTIME in the last 168 hours.  CBC:  Recent Labs Lab 08/04/16 1228 08/04/16 1243 08/05/16 0454 08/06/16 0207  WBC 19.4*  --  13.6* 12.6*  HGB 11.4* 12.6* 10.9* 9.1*  HCT 34.7* 37.0* 32.9* 27.6*  MCV 84.8  --  83.5 83.1  PLT 288  --  300 299   Cardiac Enzymes:  Recent Labs Lab 08/04/16 1646 08/04/16 2126 08/05/16 0454  TROPONINI 0.46* 3.31* 9.70*   BNP (last 3 results) No results for input(s): PROBNP in the last 8760 hours. CBG:  Recent Labs Lab 08/05/16 1147 08/05/16 1634 08/05/16 2024 08/06/16 0021 08/06/16 0412  GLUCAP 111* 181* 86 102* 90   D-Dimer: No results for input(s): DDIMER in the last 72 hours. Hgb A1c:  Recent Labs  08/04/16 1646  HGBA1C 6.2*   Lipid Profile: No results for input(s): CHOL, HDL, LDLCALC, TRIG, CHOLHDL, LDLDIRECT in the last 72 hours. Thyroid  function studies: No results for input(s): TSH, T4TOTAL, T3FREE, THYROIDAB in the last 72 hours.  Invalid input(s): FREET3 Anemia work up: No results for input(s): VITAMINB12, FOLATE, FERRITIN, TIBC, IRON, RETICCTPCT in the last 72 hours. Sepsis Labs:  Recent Labs Lab 08/04/16 1228 08/05/16 0454 08/06/16 0207  WBC 19.4* 13.6* 12.6*    Microbiology Recent Results (from the past 240 hour(s))  MRSA PCR Screening     Status: None   Collection Time: 08/04/16  7:17 PM  Result Value Ref Range Status   MRSA by PCR NEGATIVE NEGATIVE Final    Comment:        The GeneXpert MRSA Assay (FDA approved for NASAL specimens only), is one component of a comprehensive MRSA colonization surveillance program. It is not intended to diagnose MRSA infection nor to guide or monitor treatment for MRSA infections.     Radiology: Ct Angio Chest Pe W And/or Wo Contrast  Addendum Date: 08/04/2016   ADDENDUM REPORT: 08/04/2016 15:56 ADDENDUM: Additional clinical information, remote  history of prostate cancer. Upon re-review of images, multiple vague low-attenuation lesions in the liver suspicious for metastatic lesions. Dedicated CT abdomen pelvis with contrast would better evaluate the liver lesions. Electronically Signed   By: Donavan Foil M.D.   On: 08/04/2016 15:56   Result Date: 08/04/2016 CLINICAL DATA:  Shortness of breath and chest pain EXAM: CT ANGIOGRAPHY CHEST WITH CONTRAST TECHNIQUE: Multidetector CT imaging of the chest was performed using the standard protocol during bolus administration of intravenous contrast. Multiplanar CT image reconstructions and MIPs were obtained to evaluate the vascular anatomy. CONTRAST:  100 mL Isovue 370 intravenous COMPARISON:  Chest x-ray 08/04/2016, CT chest 04/12/2012 FINDINGS: Cardiovascular: Satisfactory opacification of the pulmonary arteries to the segmental level. Extensive filling defect within right lower lobe lobar, segmental, and subsegmental arteries. Additional filling defects within segmental and subsegmental branches of the right upper and middle lobe arterial branches. Filling defect also present within left upper lobe lobar, segmental and subsegmental branches with additional small filling defects present within segmental and subsegmental left lower lobe arterial branches. RV LV ratio elevated at 3.2. Small pericardial effusion. Mildly ectatic ascending aorta. Bovine arch variant. Atherosclerotic calcifications. Mediastinum/Nodes: Trachea is midline. Thyroid grossly unremarkable. Scattered subcentimeter lymph nodes in the mediastinum, grossly unchanged, for example precarinal lymph node measures 1 cm. Ill-defined low density within the bilateral hilar regions, uncertain if this is all due to thrombus. The esophagus is grossly unremarkable. Lungs/Pleura: Mild hazy density in the perihilar regions. No consolidation, effusion or pneumothorax. Within the anterior right lung base, there is a 6 x 4 mm oval nodule, 5 mm average diameter,  series 6, image number 100. Upper Abdomen: No acute abnormality in the upper abdomen, possible mildly nodular contour of the liver. Mild gynecomastia. Musculoskeletal: Degenerative changes of the spine. No acute or suspicious bone lesions. Review of the MIP images confirms the above findings. IMPRESSION: 1. The study is positive for acute bilateral pulmonary emboli, extensive on the right side. Positive for acute PE with CT evidence of right heart strain (RV/LV Ratio = 3.2) consistent with at least submassive (intermediate risk) PE. The presence of right heart strain has been associated with an increased risk of morbidity and mortality. Please activate Code PE by paging [phone]. 2. Ill-defined soft tissue density within the bilateral hilar regions, uncertain if this is all secondary to thrombus, suggest short interval CT follow-up to exclude hilar adenopathy or possible developing soft tissue density in the left greater than right hila. 3. 5  mm pulmonary nodule in the anterior right lung base. No follow-up needed if patient is low-risk. Non-contrast chest CT can be considered in 12 months if patient is high-risk. This recommendation follows the consensus statement: Guidelines for Management of Incidental Pulmonary Nodules Detected on CT Images: From the Fleischner Society 2017; Radiology 2017; 284:228-243. 4. Mild hazy attenuation within the bilateral perihilar regions could relate to mild edema. Critical Value/emergent results were called by telephone at the time of interpretation on 08/04/2016 at 2:55 pm to Dr. Deno Etienne , who verbally acknowledged these results. Electronically Signed: By: Donavan Foil M.D. On: 08/04/2016 14:55   Dg Chest Port 1 View  Result Date: 08/04/2016 CLINICAL DATA:  Chest pain, shortness of Breath EXAM: PORTABLE CHEST 1 VIEW COMPARISON:  10/25/2014 FINDINGS: Cardiomediastinal silhouette is stable. No infiltrate or pulmonary edema. Stable atelectasis or scarring in lingula. Mild  elevation of the right hemidiaphragm again noted. IMPRESSION: No active disease. Stable atelectasis or scarring in lingula. Again noted mild elevation of the right hemidiaphragm. Electronically Signed   By: Lahoma Crocker M.D.   On: 08/04/2016 13:18    Medications:   . chlorhexidine  15 mL Mouth Rinse BID  . insulin aspart  2-6 Units Subcutaneous Q4H  . mouth rinse  15 mL Mouth Rinse q12n4p  . pantoprazole  40 mg Oral Daily   Continuous Infusions: . sodium chloride 50 mL/hr at 08/05/16 2148  . heparin 1,600 Units/hr (08/06/16 1410)    Medical decision making is of high complexity and this patient is at high risk of deterioration, therefore this is a level 3 visit.  (> 4 problem points, >4 data points, high risk)    LOS: 2 days   RAMA,CHRISTINA  [facility] Pager [phone]. If unable to reach me by pager, please call my cell phone at [phone].  *Please refer to [email or web address], password [email or web address] to get updated schedule on who will round on this patient, as hospitalists switch teams weekly. If 7PM-7AM, please contact night-coverage at [email or web address], password [email or web address] for any overnight needs.  08/06/2016, 7:44 AM

## 2016-08-07 DIAGNOSIS — I2692 Saddle embolus of pulmonary artery without acute cor pulmonale: Secondary | ICD-10-CM

## 2016-08-07 DIAGNOSIS — C787 Secondary malignant neoplasm of liver and intrahepatic bile duct: Secondary | ICD-10-CM

## 2016-08-07 DIAGNOSIS — R7309 Other abnormal glucose: Secondary | ICD-10-CM

## 2016-08-07 DIAGNOSIS — R7989 Other specified abnormal findings of blood chemistry: Secondary | ICD-10-CM

## 2016-08-07 DIAGNOSIS — J9600 Acute respiratory failure, unspecified whether with hypoxia or hypercapnia: Secondary | ICD-10-CM

## 2016-08-07 DIAGNOSIS — C799 Secondary malignant neoplasm of unspecified site: Secondary | ICD-10-CM | POA: Diagnosis present

## 2016-08-07 LAB — CBC
HEMATOCRIT: 27.4 % — AB (ref 39.0–52.0)
Hemoglobin: 9 g/dL — ABNORMAL LOW (ref 13.0–17.0)
MCH: 27.5 pg (ref 26.0–34.0)
MCHC: 32.8 g/dL (ref 30.0–36.0)
MCV: 83.8 fL (ref 78.0–100.0)
Platelets: 320 10*3/uL (ref 150–400)
RBC: 3.27 MIL/uL — AB (ref 4.22–5.81)
RDW: 14.5 % (ref 11.5–15.5)
WBC: 13.6 10*3/uL — AB (ref 4.0–10.5)

## 2016-08-07 LAB — GLUCOSE, CAPILLARY
GLUCOSE-CAPILLARY: 93 mg/dL (ref 65–99)
Glucose-Capillary: 85 mg/dL (ref 65–99)
Glucose-Capillary: 98 mg/dL (ref 65–99)
Glucose-Capillary: 129 mg/dL — ABNORMAL HIGH (ref 65–99)

## 2016-08-07 LAB — BASIC METABOLIC PANEL
ANION GAP: 7 (ref 5–15)
BUN: 8 mg/dL (ref 6–20)
CO2: 22 mmol/L (ref 22–32)
Calcium: 7.9 mg/dL — ABNORMAL LOW (ref 8.9–10.3)
Chloride: 103 mmol/L (ref 101–111)
Creatinine, Ser: 0.63 mg/dL (ref 0.61–1.24)
GFR calc Af Amer: >60 mL/min (ref >60)
GFR calc non Af Amer: >60 mL/min (ref >60)
GLUCOSE: 91 mg/dL (ref 65–99)
Potassium: 3.6 mmol/L (ref 3.5–5.1)
Sodium: 132 mmol/L — ABNORMAL LOW (ref 135–145)

## 2016-08-07 LAB — HEPARIN LEVEL (UNFRACTIONATED): Heparin Unfractionated: 0.4 IU/mL (ref 0.30–0.70)

## 2016-08-07 LAB — TROPONIN I: TROPONIN I: 4.99 ng/mL — AB (ref <0.03)

## 2016-08-07 MED ORDER — HEPARIN (PORCINE) IN NACL 100-0.45 UNIT/ML-% IJ SOLN
1700.0000 [IU]/h | INTRAMUSCULAR | Status: DC
  Administered 2016-08-07 – 2016-08-08 (×2): 1600 [IU]/h via INTRAVENOUS
  Filled 2016-08-07 (×3): qty 250

## 2016-08-07 MED ORDER — APIXABAN 5 MG PO TABS
10.0000 mg | ORAL_TABLET | Freq: Two times a day (BID) | ORAL | Status: DC
  Administered 2016-08-07: 10 mg via ORAL
  Filled 2016-08-07: qty 2

## 2016-08-07 MED ORDER — APIXABAN 5 MG PO TABS: 5.0000 mg | ORAL_TABLET | Freq: Two times a day (BID) | ORAL | Status: DC

## 2016-08-07 NOTE — Progress Notes (Signed)
ANTICOAGULATION CONSULT NOTE - Initial Consult  Pharmacy Consult for  apixaban Indication: pulmonary embolus  No Known Allergies  Patient Measurements: Height: 5\' 10"  (177.8 cm) Weight: 162 lb 14.4 oz (73.9 kg) IBW/kg (Calculated) : 73   Vital Signs: Temp: 98.4 F (36.9 C) (03/10 0633) Temp Source: Oral (03/10 0633) BP: 120/73 (03/10 6060) Pulse Rate: 99 (03/10 0633)  Labs:  Recent Labs  08/04/16 1646  08/04/16 2126 08/05/16 0454  08/06/16 0207 08/06/16 1435 08/07/16 0232  HGB  --   --   --  10.9*  --  9.1*  --  9.0*  HCT  --   --   --  32.9*  --  27.6*  --  27.4*  PLT  --   --   --  300  --  299  --  320  HEPARINUNFRC  --   < > 0.11* 0.37  < > 0.28* 0.51 0.40  CREATININE  --   --   --  0.68  --  0.63  --  0.63  TROPONINI 0.46*  --  3.31* 9.70*  --   --   --   --   < > = values in this interval not displayed.  Estimated Creatinine Clearance: 84.9 mL/min (by C-G formula based on SCr of 0.63 mg/dL).    Assessment: PT is a 71 yo male with CTA positive for B/L PE with R heart strain. Patient was not an EKOs candidate and received initial therapy with heparin. Patient will now be transitioned to apixaban. Patient's renal function is stable with SCr < 1. H/H is stable and platelets are within normal limits. No signs of bleeding have been noted.   Goal of Therapy:  Anticoagulation with apixaban  Plan:  Apixaban 10 mg twice a day for 7 days  Then Apixaban 5 mg twice a day Monitor for signs/symptoms of bleeding Educate patient on new apixaban  Ihor Austin, PharmD PGY1 Pharmacy Resident Pager: [phone] 08/07/2016,7:46 AM

## 2016-08-07 NOTE — Progress Notes (Signed)
ANTICOAGULATION CONSULT NOTE - Follow Up Consult  Pharmacy Consult for Heparin Indication: pulmonary embolus  No Known Allergies  Patient Measurements: Height: 5\' 10"  (177.8 cm) Weight: 162 lb 14.4 oz (73.9 kg) IBW/kg (Calculated) : 73 Heparin Dosing Weight:  77.1 kg  Vital Signs: Temp: 98.4 F (36.9 C) (03/10 0633) Temp Source: Oral (03/10 7628) BP: 120/73 (03/10 3151) Pulse Rate: 99 (03/10 0633)  Labs:  Recent Labs  08/04/16 2126 08/05/16 0454  08/06/16 0207 08/06/16 1435 08/07/16 0232 08/07/16 0747  HGB  --  10.9*  --  9.1*  --  9.0*  --   HCT  --  32.9*  --  27.6*  --  27.4*  --   PLT  --  300  --  299  --  320  --   HEPARINUNFRC 0.11* 0.37  < > 0.28* 0.51 0.40  --   CREATININE  --  0.68  --  0.63  --  0.63  --   TROPONINI 3.31* 9.70*  --   --   --   --  4.99*  < > = values in this interval not displayed.  Estimated Creatinine Clearance: 84.9 mL/min (by C-G formula based on SCr of 0.63 mg/dL).   Assessment: 72 yo male with PE. Not an EKOS candidate d/t multiple liver lesions suspicious for metastatic cancer. He was change to apixiban but now plans for biopsy and will need to resume heparin (last heparin rate was 1600 units/hr and heparin level= 0.4).  Will need to use aPTTs with recent apixiban. -Last dose of apixiban was 10mg  at 9am on 3/10  Goal of Therapy:  Heparin level 0.3-0.7 units/ml  . Monitor platelets by anticoagulation protocol: Yes   Plan:  -restart heparin at 9pm at 1600 units/hr -aPTT and heparin level in 8 hours -daily heparin level CBC and heparin level  Hildred Laser, Pharm D 08/07/2016 11:42 AM

## 2016-08-07 NOTE — Consult Note (Signed)
Chief Complaint: Patient was seen in consultation today for US guided liver lesion biopsy Chief Complaint  Patient presents with  . Shortness of Breath    Referring Physician(s): Rama,C  Supervising Physician: Aletta Edouard  Patient Status: [facility] - In-pt  History of Present Illness: Jared Clements is a 71 y.o. male with past medical history as seen below including prostate cancer in 2013, status post prostatectomy and radiation therapy.  He was admitted to the hospital on 3/7 with respiratory failure. Subsequent imaging revealed bilateral PE with right heart strain/acute cor pulmonale. He is currently on IV heparin therapy. In addition, he was noted to have a 5 mm right lung nodule, pancreatic mass with associated lymphadenopathy, stable bilateral adrenal nodules and multiple liver lesions. Request now received from primary care team for image guided liver lesion biopsy for further evaluation.  Past Medical History:  Diagnosis Date  . Acute pancreatitis 10/24/2014  . Acute saddle pulmonary embolism with acute cor pulmonale ([place]) 08/04/2016  . Aortic calcification ([place]) 08/06/2016  . Cancer ([place])    prostates ca  . Hypertension   . Impaired fasting glucose 08/06/2016   Hemoglobin A1c 6.2% March, 2018.  Marland Kitchen Pyelonephritis 09/15/2012  . Solitary pulmonary nodule 08/06/2016    Past Surgical History:  Procedure Laterality Date  . PROSTATE SURGERY      Allergies: Patient has no known allergies.  Medications: Prior to Admission medications   Medication Sig Start Date End Date Taking? Authorizing Provider  amLODipine (NORVASC) 10 MG tablet Take 1 tablet (10 mg total) by mouth daily. Patient not taking: Reported on 08/05/2016 10/28/14   Orson Eva, MD  diphenhydrAMINE (BENADRYL) 25 MG tablet Take 1 tablet (25 mg total) by mouth every 6 (six) hours as needed for itching. Patient not taking: Reported on 08/05/2016 10/21/14   Ellwood Dense, MD  hydrocortisone cream 1 % Apply to affected area on chest  2 times daily as needed for itching Patient not taking: Reported on 08/05/2016 10/21/14   Ellwood Dense, MD  metoprolol tartrate (LOPRESSOR) 25 MG tablet Take 1 tablet (25 mg total) by mouth 2 (two) times daily. Patient not taking: Reported on 08/05/2016 10/28/14   Orson Eva, MD     Family History  Problem Relation Age of Onset  . Other Mother   . Other Father     Social History   Social History  . Marital status: Married    Spouse name: N/A  . Number of children: N/A  . Years of education: N/A   Social History Main Topics  . Smoking status: Never Smoker  . Smokeless tobacco: Never Used  . Alcohol use No  . Drug use: No  . Sexual activity: No   Other Topics Concern  . None   Social History Narrative  . None      Review of Systems see above; currently denies fever, headache, worsening chest pain/dyspnea, abdominal/back pain, nausea /vomiting or abnormal bleeding. He has had weight loss.  Vital Signs: BP 126/73 (BP Location: Right Arm)   Pulse (!) 107   Temp 98.1 F (36.7 C) (Oral)   Resp 18   Ht 5\' 10"  (1.778 m)   Wt 162 lb 14.4 oz (73.9 kg)   SpO2 98%   BMI 23.37 kg/m   Physical Exam awake, alert. thin black male in no acute distress. Chest with bibasilar crackles. Heart with regular rate and rhythm; abdomen soft, positive bowel sounds, nontender; no lower extremity edema  Mallampati Score:  Imaging: Ct Angio Chest Pe W And/or Wo Contrast  Addendum Date: 08/04/2016   ADDENDUM REPORT: 08/04/2016 15:56 ADDENDUM: Additional clinical information, remote history of prostate cancer. Upon re-review of images, multiple vague low-attenuation lesions in the liver suspicious for metastatic lesions. Dedicated CT abdomen pelvis with contrast would better evaluate the liver lesions. Electronically Signed   By: Donavan Foil M.D.   On: 08/04/2016 15:56   Result Date: 08/04/2016 CLINICAL DATA:  Shortness of breath and chest pain EXAM: CT ANGIOGRAPHY CHEST WITH CONTRAST  TECHNIQUE: Multidetector CT imaging of the chest was performed using the standard protocol during bolus administration of intravenous contrast. Multiplanar CT image reconstructions and MIPs were obtained to evaluate the vascular anatomy. CONTRAST:  100 mL Isovue 370 intravenous COMPARISON:  Chest x-ray 08/04/2016, CT chest 04/12/2012 FINDINGS: Cardiovascular: Satisfactory opacification of the pulmonary arteries to the segmental level. Extensive filling defect within right lower lobe lobar, segmental, and subsegmental arteries. Additional filling defects within segmental and subsegmental branches of the right upper and middle lobe arterial branches. Filling defect also present within left upper lobe lobar, segmental and subsegmental branches with additional small filling defects present within segmental and subsegmental left lower lobe arterial branches. RV LV ratio elevated at 3.2. Small pericardial effusion. Mildly ectatic ascending aorta. Bovine arch variant. Atherosclerotic calcifications. Mediastinum/Nodes: Trachea is midline. Thyroid grossly unremarkable. Scattered subcentimeter lymph nodes in the mediastinum, grossly unchanged, for example precarinal lymph node measures 1 cm. Ill-defined low density within the bilateral hilar regions, uncertain if this is all due to thrombus. The esophagus is grossly unremarkable. Lungs/Pleura: Mild hazy density in the perihilar regions. No consolidation, effusion or pneumothorax. Within the anterior right lung base, there is a 6 x 4 mm oval nodule, 5 mm average diameter, series 6, image number 100. Upper Abdomen: No acute abnormality in the upper abdomen, possible mildly nodular contour of the liver. Mild gynecomastia. Musculoskeletal: Degenerative changes of the spine. No acute or suspicious bone lesions. Review of the MIP images confirms the above findings. IMPRESSION: 1. The study is positive for acute bilateral pulmonary emboli, extensive on the right side. Positive for  acute PE with CT evidence of right heart strain (RV/LV Ratio = 3.2) consistent with at least submassive (intermediate risk) PE. The presence of right heart strain has been associated with an increased risk of morbidity and mortality. Please activate Code PE by paging [phone]. 2. Ill-defined soft tissue density within the bilateral hilar regions, uncertain if this is all secondary to thrombus, suggest short interval CT follow-up to exclude hilar adenopathy or possible developing soft tissue density in the left greater than right hila. 3. 5 mm pulmonary nodule in the anterior right lung base. No follow-up needed if patient is low-risk. Non-contrast chest CT can be considered in 12 months if patient is high-risk. This recommendation follows the consensus statement: Guidelines for Management of Incidental Pulmonary Nodules Detected on CT Images: From the Fleischner Society 2017; Radiology 2017; 284:228-243. 4. Mild hazy attenuation within the bilateral perihilar regions could relate to mild edema. Critical Value/emergent results were called by telephone at the time of interpretation on 08/04/2016 at 2:55 pm to Dr. Deno Etienne , who verbally acknowledged these results. Electronically Signed: By: Donavan Foil M.D. On: 08/04/2016 14:55   Ct Abdomen Pelvis W Contrast  Result Date: 08/07/2016 CLINICAL DATA:  Prostate cancer EXAM: CT ABDOMEN AND PELVIS WITH CONTRAST TECHNIQUE: Multidetector CT imaging of the abdomen and pelvis was performed using the standard protocol following bolus administration of intravenous contrast.  CONTRAST:  156mL ISOVUE-300 IOPAMIDOL (ISOVUE-300) INJECTION 61% COMPARISON:  10/26/2014 CT FINDINGS: Lower chest: Stable cardiomegaly. Small posterior pericardial effusion measuring up to 12 mm in thickness. Trace bilateral pleural effusions minimal bibasilar atelectasis. Tiny 4 mm lingular nodular density, series 3, image 2. Hepatobiliary: Numerous hypodense peripherally enhancing hepatic masses  consistent with metastatic disease throughout the right and left lobes and caudate. These range in size from 0.5 cm through 4.6 cm This is a new finding since prior CT. No biliary dilatation. Gallbladder is contracted without stones. Pancreas: 2.3 x 1.6 cm subtle masslike abnormality of the pancreatic body (series 3, image 32) with pancreatic ductal dilatation measuring up to 7 mm leading up to this lesion. The pancreatic head and uncinate process are grossly unremarkable. Spleen: Normal Adrenals/Urinary Tract: Stable bilateral adrenal nodules on the right measuring 2.5 x 1.3 cm and on the left, 2.1 x 1.3 cm. These were present on prior study and unchanged in appearance and may reflect adenomas. These precede the pancreatic and hepatic findings. Stomach/Bowel: The stomach is contracted. No small or large bowel dilatation or inflammation is identified. Appendix may be surgically absent. There is a surgical clip at the cecum. Vascular/Lymphatic: Enlarged porta hepatis lymph node measuring 2 x 0.9 cm. Small lymph node measuring 1.2 cm adjacent to the tail of the pancreas. No thrombosis noted of the splenic, superior mesenteric nor portal vein. Reproductive: Prostate is unremarkable. Other: No abdominal wall hernia or abnormality. No abdominopelvic ascites. Musculoskeletal: Chronic remote compressions of L4 and L5 unchanged in appearance. No lytic or blastic disease. IMPRESSION: 1. Suspicious 2.3 x 1.6 cm masslike abnormality in the pancreatic body causing pancreatic ductal dilatation up to 7 mm in caliber leading up to this lesion. Adjacent porta hepatis and peripancreatic lymph nodes consistent with metastatic lymphadenopathy. 2. Numerous large hypodense enhancing metastatic lesions of the liver. These are likely amenable for percutaneous sampling if indicated. 3. Chronic stable bilateral adrenal nodules since 2016. Given their stability, these likely reflect benign findings such as adenomas as opposed to metastasis.  4. Small posterior pericardial effusion measuring up to 12 mm in thickness. 5. Tiny 4 mm lingular nodule. 6. Chronic mild compressions of L4 and L5.  Osteoblastic disease. Electronically Signed   By: Ashley Royalty M.D.   On: 08/07/2016 00:47   Dg Chest Port 1 View  Result Date: 08/04/2016 CLINICAL DATA:  Chest pain, shortness of Breath EXAM: PORTABLE CHEST 1 VIEW COMPARISON:  10/25/2014 FINDINGS: Cardiomediastinal silhouette is stable. No infiltrate or pulmonary edema. Stable atelectasis or scarring in lingula. Mild elevation of the right hemidiaphragm again noted. IMPRESSION: No active disease. Stable atelectasis or scarring in lingula. Again noted mild elevation of the right hemidiaphragm. Electronically Signed   By: Lahoma Crocker M.D.   On: 08/04/2016 13:18    Labs:  CBC:  Recent Labs  08/04/16 1228 08/04/16 1243 08/05/16 0454 08/06/16 0207 08/07/16 0232  WBC 19.4*  --  13.6* 12.6* 13.6*  HGB 11.4* 12.6* 10.9* 9.1* 9.0*  HCT 34.7* 37.0* 32.9* 27.6* 27.4*  PLT 288  --  300 299 320    COAGS: No results for input(s): INR, APTT in the last 8760 hours.  BMP:  Recent Labs  08/04/16 1228 08/04/16 1243 08/05/16 0454 08/06/16 0207 08/07/16 0232  NA 131* 130* 132* 132* 132*  K 3.8 4.2 4.2 3.7 3.6  CL 98* 102 102 103 103  CO2 18*  --  19* 21* 22  GLUCOSE 159* 167* 106* 105* 91  BUN 14 18  15 13 8   CALCIUM 8.5*  --  8.0* 7.8* 7.9*  CREATININE 0.94 0.80 0.68 0.63 0.63  GFRNONAA >60  --  >60 >60 >60  GFRAA >60  --  >60 >60 >60    LIVER FUNCTION TESTS: No results for input(s): BILITOT, AST, ALT, ALKPHOS, PROT, ALBUMIN in the last 8760 hours.  TUMOR MARKERS: No results for input(s): AFPTM, CEA, CA199, CHROMGRNA in the last 8760 hours.  Assessment and Plan: 71 y.o. male with past medical history including HTN, HLD, prior pancreatitis,  and prostate cancer in 2013, status post prostatectomy and radiation therapy.  He was admitted to the hospital on 3/7 with respiratory failure.  Subsequent imaging revealed bilateral PE with right heart strain/acute cor pulmonale. He is currently on IV heparin therapy. In addition, he was noted to have a 5 mm right lung nodule, pancreatic mass with associated lymphadenopathy, stable bilateral adrenal nodules and multiple liver lesions. Request now received from primary care team for image guided liver lesion biopsy for further evaluation. Imaging studies were reviewed by Dr. Vernard Gambles. Liver lesions appear amenable to percutaneous biopsy.Risks and benefits discussed with the patient/son including, but not limited to bleeding, infection, damage to adjacent structures or low yield requiring additional tests.All of the patient's questions were answered, patient is agreeable to proceed.Consent signed and in chart. Patient currently on IV heparin-this will need to be held 6 hours prior to the biopsy to minimize associated bleeding risk.      Thank you for this interesting consult.  I greatly enjoyed meeting Deadrian Talavera and look forward to participating in their care.  A copy of this report was sent to the requesting provider on this date.  Electronically Signed: D. Rowe Robert 08/07/2016, 1:53 PM   I spent a total of  25 minutes   in face to face in clinical consultation, greater than 50% of which was counseling/coordinating care for ultrasound-guided liver lesion biopsy

## 2016-08-07 NOTE — Progress Notes (Signed)
Progress Note    Jared Clements  JHE:[id number] DOB: June 16, 1945  DOA: 08/04/2016 PCP: Harvie Junior, MD    Brief Narrative:   Chief complaint: Follow-up dyspnea  Medical records reviewed and are as summarized below.  Jared Clements is an 71 y.o. male with a PMH of prostate cancer status post prostatectomy and radiation therapy in 2013, hospital admission in 2016 for acute pancreatitis of unclear origin, hospital admission in 2014 for treatment of Escherichia coli acute pyelonephritis, hypertension who was admitted 08/04/16 by the critical care team for evaluation of hypoxic respiratory failure requiring BiPAP. CT angiogram of the chest showed bilateral pulmonary emboli with right heart strain.  Assessment/Plan:   Principal Problem:   Acute saddle pulmonary embolism with acute cor pulmonale (HCC) with acute hypoxic respiratory failure Initially required BiPAP. CTA Chest (Personally reviewed) revealed bilateral pulmonary emboli extensive on the right side, with right heart strain with RV/LV Ratio of 3.2. Continue heparin drip. Follow-up 2-D echocardiogram. PSA not elevated, but given pulmonary embolism, pulmonary nodule, and liver nodularity there is a concern for metastatic prostate cancer. Can likely transition to oral blood thinners, but will need case management to evaluate insurance situation to see if he qualifies for a NOAC.He will likely need a PET scan post discharge. Will transition to Eliquis.   Active Problems:   Troponin elevation/demand ischemia Significant bump in troponin, but has trended down. Eechocardiogram shows EF of 55-60%, no RWMA and elevated pulmonary pressures with mildly reduced RV function. Heparinized at present. Likely demand ischemia in the setting of extensive pulmonary emboli. 12-lead EKG personally reviewed and showed nonspecific repolarization abnormalities in the inferior lead and minimal ST elevations in the lateral leads. Patient is at risk for heart disease  given his aortic calcifications, history of hypertension and hyperlipidemia, age, and prediabetes. Will need an outpatient stress test when he is more medically stable.    Hyponatremia Mild. Monitor.    H/O HLD (hyperlipidemia) Not on medication. Fasting lipid panel: Cholesterol 110, LDL 70.    Essential hypertension Managed with amlodipine and Norvasc at home. Antihypertensives currently on hold. Blood pressure currently controlled.    History of prostate cancer Diagnosed in 2013, status post surgical resection and radiation therapy.    Aortic calcification (HCC) Checked fasting lipid panel: Cholesterol 110, LDL 70. Heparinized, so would not put on aspirin at this time.    Solitary pulmonary nodule/liver nodularity/metastatic disease  3. 5 mm pulmonary nodule in the anterior right lung base and liver nodularity noted on CT. No follow-up of the pulmonary nodule needed if patient is low-risk. Non-contrast chest CT can be considered in 12 months if patient is high-risk. Patient is a nonsmoker, but he does have a history of prostate cancer. CT of the abdomen and pelvis shows a pancreatic mass (pictured below), metastatic lymphadenopathy, and liver metastasis. Will arrange CT biopsy.  Check CEA and CA 19-9.     Normocytic anemia/anemia of chronic disease B-12 4482, ferritin 899, iron 18, TIBC 203. Findings are consistent with anemia of chronic disease.    Impaired fasting glucose/prediabetes Hemoglobin A1c is 6.2%, corresponding with prediabetes. Dietitian has provided diet counseling. Discontinue SSI.  Moderate malnutrition/weight loss Body mass index is 23.37 kg/m. Evaluated by dietitian. Continue nutritional supplements.   Family Communication/Anticipated D/C date and plan/Code Status   DVT prophylaxis: Heparin. Code Status: Full Code.  Family Communication: Multiple family updated at the bedside. Disposition Plan: Home when switched to oral blood thinners and hemodynamically  stable.  Medical Consultants:    Pulmonology   Procedures:    None  Anti-Infectives:    None  Subjective:   Jared Clements continues to have some shortness of breath, but improving.  He has lost 15 pounds in the past month. No bleeding. No chest pain.  Objective:    Vitals:   08/06/16 0414 08/06/16 1332 08/06/16 2102 08/07/16 0633  BP: 119/76 123/74 131/73 120/73  Pulse: (!) 107 (!) 101 (!) 106 99  Resp: 18 18 18 20   Temp: 97.8 F (36.6 C) 98.4 F (36.9 C) 99 F (37.2 C) 98.4 F (36.9 C)  TempSrc: Oral Oral Oral Oral  SpO2: 100% 100% 96% 99%  Weight: 74.4 kg (164 lb)   73.9 kg (162 lb 14.4 oz)  Height:        Intake/Output Summary (Last 24 hours) at 08/07/16 1145 Last data filed at 08/06/16 1900  Gross per 24 hour  Intake              120 ml  Output                0 ml  Net              120 ml   Filed Weights   08/05/16 0500 08/06/16 0414 08/07/16 0633  Weight: 73.7 kg (162 lb 7.7 oz) 74.4 kg (164 lb) 73.9 kg (162 lb 14.4 oz)    Exam: General exam: Appears calm and comfortable. Cachectic appearing. Respiratory system: Bibasilar crackles persist. Respiratory effort normal. Cardiovascular system: S1 & S2 heard, RRR. No JVD,  rubs, gallops or clicks. No murmurs. Gastrointestinal system: Abdomen is nondistended, soft and nontender. No organomegaly or masses felt. Normal bowel sounds heard. Central nervous system: Alert and oriented. No focal neurological deficits. Extremities: No clubbing,  or cyanosis. No edema. Skin: No rashes, lesions or ulcers. Psychiatry: Judgement and insight appear normal. Mood & affect flat.   Data Reviewed:   I have personally reviewed following labs and imaging studies:  Labs: Basic Metabolic Panel:  Recent Labs Lab 08/04/16 1228 08/04/16 1243 08/05/16 0454 08/06/16 0207 08/07/16 0232  NA 131* 130* 132* 132* 132*  K 3.8 4.2 4.2 3.7 3.6  CL 98* 102 102 103 103  CO2 18*  --  19* 21* 22  GLUCOSE 159* 167* 106* 105* 91    BUN 14 18 15 13 8   CREATININE 0.94 0.80 0.68 0.63 0.63  CALCIUM 8.5*  --  8.0* 7.8* 7.9*  MG  --   --  2.1  --   --   PHOS  --   --  3.3  --   --    GFR Estimated Creatinine Clearance: 84.9 mL/min (by C-G formula based on SCr of 0.63 mg/dL). Liver Function Tests: No results for input(s): AST, ALT, ALKPHOS, BILITOT, PROT, ALBUMIN in the last 168 hours. No results for input(s): LIPASE, AMYLASE in the last 168 hours. No results for input(s): AMMONIA in the last 168 hours. Coagulation profile No results for input(s): INR, PROTIME in the last 168 hours.  CBC:  Recent Labs Lab 08/04/16 1228 08/04/16 1243 08/05/16 0454 08/06/16 0207 08/07/16 0232  WBC 19.4*  --  13.6* 12.6* 13.6*  HGB 11.4* 12.6* 10.9* 9.1* 9.0*  HCT 34.7* 37.0* 32.9* 27.6* 27.4*  MCV 84.8  --  83.5 83.1 83.8  PLT 288  --  300 299 320   Cardiac Enzymes:  Recent Labs Lab 08/04/16 1646 08/04/16 2126 08/05/16 0454 08/07/16 0747  TROPONINI 0.46* 3.31*  9.70* 4.99*   BNP (last 3 results) No results for input(s): PROBNP in the last 8760 hours. CBG:  Recent Labs Lab 08/06/16 1119 08/06/16 1640 08/06/16 2056 08/07/16 0638 08/07/16 1126  GLUCAP 107* 98 89 85 98   D-Dimer: No results for input(s): DDIMER in the last 72 hours. Hgb A1c:  Recent Labs  08/04/16 1646  HGBA1C 6.2*   Lipid Profile:  Recent Labs  08/06/16 0811  CHOL 110  HDL 20*  LDLCALC 70  TRIG 99  CHOLHDL 5.5   Thyroid function studies: No results for input(s): TSH, T4TOTAL, T3FREE, THYROIDAB in the last 72 hours.  Invalid input(s): FREET3 Anemia work up:  Recent Labs  08/06/16 0811  VITAMINB12 4,482*  FERRITIN 899*  TIBC 203*  IRON 18*   Sepsis Labs:  Recent Labs Lab 08/04/16 1228 08/05/16 0454 08/06/16 0207 08/07/16 0232  WBC 19.4* 13.6* 12.6* 13.6*    Microbiology Recent Results (from the past 240 hour(s))  MRSA PCR Screening     Status: None   Collection Time: 08/04/16  7:17 PM  Result Value Ref  Range Status   MRSA by PCR NEGATIVE NEGATIVE Final    Comment:        The GeneXpert MRSA Assay (FDA approved for NASAL specimens only), is one component of a comprehensive MRSA colonization surveillance program. It is not intended to diagnose MRSA infection nor to guide or monitor treatment for MRSA infections.     Radiology: Ct Abdomen Pelvis W Contrast  Result Date: 08/07/2016 CLINICAL DATA:  Prostate cancer EXAM: CT ABDOMEN AND PELVIS WITH CONTRAST TECHNIQUE: Multidetector CT imaging of the abdomen and pelvis was performed using the standard protocol following bolus administration of intravenous contrast. CONTRAST:  132mL ISOVUE-300 IOPAMIDOL (ISOVUE-300) INJECTION 61% COMPARISON:  10/26/2014 CT FINDINGS: Lower chest: Stable cardiomegaly. Small posterior pericardial effusion measuring up to 12 mm in thickness. Trace bilateral pleural effusions minimal bibasilar atelectasis. Tiny 4 mm lingular nodular density, series 3, image 2. Hepatobiliary: Numerous hypodense peripherally enhancing hepatic masses consistent with metastatic disease throughout the right and left lobes and caudate. These range in size from 0.5 cm through 4.6 cm This is a new finding since prior CT. No biliary dilatation. Gallbladder is contracted without stones. Pancreas: 2.3 x 1.6 cm subtle masslike abnormality of the pancreatic body (series 3, image 32) with pancreatic ductal dilatation measuring up to 7 mm leading up to this lesion. The pancreatic head and uncinate process are grossly unremarkable. Spleen: Normal Adrenals/Urinary Tract: Stable bilateral adrenal nodules on the right measuring 2.5 x 1.3 cm and on the left, 2.1 x 1.3 cm. These were present on prior study and unchanged in appearance and may reflect adenomas. These precede the pancreatic and hepatic findings. Stomach/Bowel: The stomach is contracted. No small or large bowel dilatation or inflammation is identified. Appendix may be surgically absent. There is a  surgical clip at the cecum. Vascular/Lymphatic: Enlarged porta hepatis lymph node measuring 2 x 0.9 cm. Small lymph node measuring 1.2 cm adjacent to the tail of the pancreas. No thrombosis noted of the splenic, superior mesenteric nor portal vein. Reproductive: Prostate is unremarkable. Other: No abdominal wall hernia or abnormality. No abdominopelvic ascites. Musculoskeletal: Chronic remote compressions of L4 and L5 unchanged in appearance. No lytic or blastic disease. IMPRESSION: 1. Suspicious 2.3 x 1.6 cm masslike abnormality in the pancreatic body causing pancreatic ductal dilatation up to 7 mm in caliber leading up to this lesion. Adjacent porta hepatis and peripancreatic lymph nodes consistent with metastatic lymphadenopathy.  2. Numerous large hypodense enhancing metastatic lesions of the liver. These are likely amenable for percutaneous sampling if indicated. 3. Chronic stable bilateral adrenal nodules since 2016. Given their stability, these likely reflect benign findings such as adenomas as opposed to metastasis. 4. Small posterior pericardial effusion measuring up to 12 mm in thickness. 5. Tiny 4 mm lingular nodule. 6. Chronic mild compressions of L4 and L5.  Osteoblastic disease. Electronically Signed   By: Ashley Royalty M.D.   On: 08/07/2016 00:47    Medications:   . chlorhexidine  15 mL Mouth Rinse BID  . mouth rinse  15 mL Mouth Rinse q12n4p  . pantoprazole  40 mg Oral Daily   Continuous Infusions:   Medical decision making is of high complexity and this patient is at high risk of deterioration, therefore this is a level 3 visit.  (> 4 problem points, >4 data points, high risk)    LOS: 3 days   Crystalle Popwell  [facility] Pager [phone]. If unable to reach me by pager, please call my cell phone at [phone].  *Please refer to [email or web address], password [email or web address] to get updated schedule on who will round on this patient, as hospitalists switch teams weekly. If 7PM-7AM, please contact  night-coverage at [email or web address], password [email or web address] for any overnight needs.  08/07/2016, 11:45 AM

## 2016-08-08 DIAGNOSIS — E876 Hypokalemia: Secondary | ICD-10-CM

## 2016-08-08 LAB — GLUCOSE, CAPILLARY
Glucose-Capillary: 84 mg/dL (ref 65–99)
Glucose-Capillary: 92 mg/dL (ref 65–99)
Glucose-Capillary: 104 mg/dL — ABNORMAL HIGH (ref 65–99)

## 2016-08-08 LAB — BASIC METABOLIC PANEL
Anion gap: 7 (ref 5–15)
BUN: 7 mg/dL (ref 6–20)
CALCIUM: 8.1 mg/dL — AB (ref 8.9–10.3)
CHLORIDE: 103 mmol/L (ref 101–111)
CO2: 22 mmol/L (ref 22–32)
CREATININE: 0.65 mg/dL (ref 0.61–1.24)
Glucose, Bld: 93 mg/dL (ref 65–99)
Potassium: 3.4 mmol/L — ABNORMAL LOW (ref 3.5–5.1)
SODIUM: 132 mmol/L — AB (ref 135–145)

## 2016-08-08 LAB — HEPATIC FUNCTION PANEL
ALT: 25 U/L (ref 17–63)
AST: 33 U/L (ref 15–41)
Albumin: 1.9 g/dL — ABNORMAL LOW (ref 3.5–5.0)
Alkaline Phosphatase: 128 U/L — ABNORMAL HIGH (ref 38–126)
BILIRUBIN DIRECT: 0.3 mg/dL (ref 0.1–0.5)
BILIRUBIN INDIRECT: 0.6 mg/dL (ref 0.3–0.9)
BILIRUBIN TOTAL: 0.9 mg/dL (ref 0.3–1.2)
Total Protein: 6.4 g/dL — ABNORMAL LOW (ref 6.5–8.1)

## 2016-08-08 LAB — CBC
HCT: 27.4 % — ABNORMAL LOW (ref 39.0–52.0)
Hemoglobin: 8.8 g/dL — ABNORMAL LOW (ref 13.0–17.0)
MCH: 27 pg (ref 26.0–34.0)
MCHC: 32.1 g/dL (ref 30.0–36.0)
MCV: 84 fL (ref 78.0–100.0)
PLATELETS: 293 10*3/uL (ref 150–400)
RBC: 3.26 MIL/uL — ABNORMAL LOW (ref 4.22–5.81)
RDW: 14.4 % (ref 11.5–15.5)
WBC: 14.6 10*3/uL — AB (ref 4.0–10.5)

## 2016-08-08 LAB — CANCER ANTIGEN 19-9: CA 19 9: 1 U/mL (ref 0–35)

## 2016-08-08 LAB — APTT
APTT: 88 s — AB (ref 24–36)
aPTT: 102 seconds — ABNORMAL HIGH (ref 24–36)

## 2016-08-08 LAB — PREALBUMIN

## 2016-08-08 LAB — CEA: CEA: 6.2 ng/mL — AB (ref 0.0–4.7)

## 2016-08-08 LAB — HEPARIN LEVEL (UNFRACTIONATED): HEPARIN UNFRACTIONATED: 1.44 [IU]/mL — AB (ref 0.30–0.70)

## 2016-08-08 MED ORDER — POTASSIUM CHLORIDE CRYS ER 20 MEQ PO TBCR
40.0000 meq | EXTENDED_RELEASE_TABLET | Freq: Once | ORAL | Status: AC
  Administered 2016-08-08: 40 meq via ORAL
  Filled 2016-08-08: qty 2

## 2016-08-08 NOTE — Progress Notes (Signed)
ANTICOAGULATION CONSULT NOTE - Follow Up Consult  Pharmacy Consult for heparin Indication: pulmonary embolus  Labs:  Recent Labs  08/05/16 0454  08/06/16 0207 08/06/16 1435 08/07/16 0232 08/07/16 0747 08/08/16 0339  HGB 10.9*  --  9.1*  --  9.0*  --   --   HCT 32.9*  --  27.6*  --  27.4*  --   --   PLT 300  --  299  --  320  --   --   APTT  --   --   --   --   --   --  88*  HEPARINUNFRC 0.37  < > 0.28* 0.51 0.40  --   --   CREATININE 0.68  --  0.63  --  0.63  --  0.65  TROPONINI 9.70*  --   --   --   --  4.99*  --   < > = values in this interval not displayed.   Assessment/Plan:  71yo male therapeutic on heparin after resumed. Will continue gtt at current rate and confirm stable with additional PTT.   Wynona Neat, PharmD, BCPS  08/08/2016,4:52 AM

## 2016-08-08 NOTE — Progress Notes (Signed)
ANTICOAGULATION CONSULT NOTE - Follow Up Consult  Pharmacy Consult for Heparin Indication: pulmonary embolus  No Known Allergies  Patient Measurements: Height: 5\' 10"  (177.8 cm) Weight: 162 lb 1.6 oz (73.5 kg) IBW/kg (Calculated) : 73 Heparin Dosing Weight:  77.1 kg  Vital Signs: Temp: 97.5 F (36.4 C) (03/11 1505) Temp Source: Oral (03/11 1505) BP: 120/68 (03/11 1505) Pulse Rate: 95 (03/11 1505)  Labs:  Recent Labs  08/06/16 0207 08/06/16 1435 08/07/16 0232 08/07/16 0747 08/08/16 0339 08/08/16 1120  HGB 9.1*  --  9.0*  --  8.8*  --   HCT 27.6*  --  27.4*  --  27.4*  --   PLT 299  --  320  --  293  --   APTT  --   --   --   --  88* 102*  HEPARINUNFRC 0.28* 0.51 0.40  --  1.44*  --   CREATININE 0.63  --  0.63  --  0.65  --   TROPONINI  --   --   --  4.99*  --   --     Estimated Creatinine Clearance: 84.9 mL/min (by C-G formula based on SCr of 0.65 mg/dL).   Assessment: 71 yo male with PE. Not an EKOS candidate d/t multiple liver lesions suspicious for metastatic cancer. He was change to apixiban but now plans for biopsy and will need to resume heparin  -aPTT= 102 with trend up  Goal of Therapy:  Heparin level 0.3-0.7 units/ml  . Monitor platelets by anticoagulation protocol: Yes   Plan:  -Decrease heparin to 1550 units/hr -dailyaPTT,  heparin level CBC and heparin level  Hildred Laser, Pharm D 08/08/2016 4:39 PM

## 2016-08-08 NOTE — Progress Notes (Signed)
Progress Note    Jared Clements  BJS:[id number] DOB: 01/06/1946  DOA: 08/04/2016 PCP: Harvie Junior, MD    Brief Narrative:   Chief complaint: Follow-up dyspnea  Medical records reviewed and are as summarized below.  Jared Clements is an 71 y.o. male with a PMH of prostate cancer status post prostatectomy and radiation therapy in 2013, hospital admission in 2016 for acute pancreatitis of unclear origin, hospital admission in 2014 for treatment of Escherichia coli acute pyelonephritis, hypertension who was admitted 08/04/16 by the critical care team for evaluation of hypoxic respiratory failure requiring BiPAP. CT angiogram of the chest showed bilateral pulmonary emboli with right heart strain.  Assessment/Plan:   Principal Problem:   Acute saddle pulmonary embolism with acute cor pulmonale (HCC) with acute hypoxic respiratory failure Initially required BiPAP. CTA Chest (Personally reviewed) revealed bilateral pulmonary emboli extensive on the right side, with right heart strain with RV/LV Ratio of 3.2. Continue heparin drip. Follow-up 2-D echocardiogram. PSA not elevated, but given pulmonary embolism, pulmonary nodule, and liver nodularity there is a concern for metastatic prostate cancer. Can likely transition to oral blood thinners, but will need case management to evaluate insurance situation to see if he qualifies for a NOAC.He will likely need a PET scan post discharge. Remains on IV heparin while awaiting biopsy, which is scheduled for 08/09/16. Can transition to Eliquis post biopsy.   Active Problems:   Troponin elevation/demand ischemia Significant bump in troponin, but has trended down. Eechocardiogram shows EF of 55-60%, no RWMA and elevated pulmonary pressures with mildly reduced RV function. Heparinized at present. Likely demand ischemia in the setting of extensive pulmonary emboli. 12-lead EKG personally reviewed and showed nonspecific repolarization abnormalities in the inferior  lead and minimal ST elevations in the lateral leads. Patient is at risk for heart disease given his aortic calcifications, history of hypertension and hyperlipidemia, age, and prediabetes. Will need an outpatient stress test when he is more medically stable.    Hypokalemia Replete.    Hyponatremia Mild. Monitor.    H/O HLD (hyperlipidemia) Not on medication. Fasting lipid panel: Cholesterol 110, LDL 70.    Essential hypertension Managed with amlodipine and Norvasc at home. Antihypertensives currently on hold. Blood pressure currently controlled.    History of prostate cancer Diagnosed in 2013, status post surgical resection and radiation therapy.    Aortic calcification (HCC) Checked fasting lipid panel: Cholesterol 110, LDL 70. Heparinized, so would not put on aspirin at this time.    Solitary pulmonary nodule/liver nodularity/metastatic disease  3. 5 mm pulmonary nodule in the anterior right lung base and liver nodularity noted on CT. No follow-up of the pulmonary nodule needed if patient is low-risk. Non-contrast chest CT can be considered in 12 months if patient is high-risk. Patient is a nonsmoker, but he does have a history of prostate cancer. CT of the abdomen and pelvis shows a pancreatic mass (pictured below), metastatic lymphadenopathy, and liver metastasis. CT biopsy planned.  CEA and CA 19-9 pending.      Normocytic anemia/anemia of chronic disease B-12 4482, ferritin 899, iron 18, TIBC 203. Findings are consistent with anemia of chronic disease.    Impaired fasting glucose/prediabetes Hemoglobin A1c is 6.2%, corresponding with prediabetes. Dietitian has provided diet counseling.   Moderate malnutrition/weight loss Body mass index is 23.26 kg/m. Evaluated by dietitian. Continue nutritional supplements.   Family Communication/Anticipated D/C date and plan/Code Status   DVT prophylaxis: Heparin. Code Status: Full Code.  Family Communication: Some updated at  the  bedside. Disposition Plan: Home when switched to oral blood thinners and hemodynamically stable, possibly 08/09/16.   Medical Consultants:    Pulmonology   Procedures:    None  Anti-Infectives:    None  Subjective:   Jared Clements Says his shortness of breath has improved.  No bleeding. No chest pain. Biopsy is scheduled for tomorrow.  Objective:    Vitals:   08/07/16 0633 08/07/16 1311 08/07/16 2040 08/08/16 0500  BP: 120/73 126/73 137/80 140/83  Pulse: 99 (!) 107 (!) 106 100  Resp: 20 18 20 20   Temp: 98.4 F (36.9 C) 98.1 F (36.7 C) 99.4 F (37.4 C) 97.1 F (36.2 C)  TempSrc: Oral Oral Oral Oral  SpO2: 99% 98% 100% 100%  Weight: 73.9 kg (162 lb 14.4 oz)   73.5 kg (162 lb 1.6 oz)  Height:       No intake or output data in the 24 hours ending 08/08/16 0722 Filed Weights   08/06/16 0414 08/07/16 0633 08/08/16 0500  Weight: 74.4 kg (164 lb) 73.9 kg (162 lb 14.4 oz) 73.5 kg (162 lb 1.6 oz)    Exam: General exam: Appears calm and comfortable. Cachectic appearing. Respiratory system: Bibasilar crackles persist. Respiratory effort normal. Cardiovascular system: S1 & S2 heard, RRR. No JVD,  rubs, gallops or clicks. No murmurs. Gastrointestinal system: Abdomen is nondistended, soft and nontender. No organomegaly or masses felt. Normal bowel sounds heard. Central nervous system: Alert and oriented. No focal neurological deficits. Extremities: No clubbing,  or cyanosis. No edema. Skin: No rashes, lesions or ulcers. Psychiatry: Judgement and insight appear normal. Mood & affect flat.   Data Reviewed:   I have personally reviewed following labs and imaging studies:  Labs: Basic Metabolic Panel:  Recent Labs Lab 08/04/16 1228 08/04/16 1243 08/05/16 0454 08/06/16 0207 08/07/16 0232 08/08/16 0339  NA 131* 130* 132* 132* 132* 132*  K 3.8 4.2 4.2 3.7 3.6 3.4*  CL 98* 102 102 103 103 103  CO2 18*  --  19* 21* 22 22  GLUCOSE 159* 167* 106* 105* 91 93  BUN 14 18 15  13 8 7   CREATININE 0.94 0.80 0.68 0.63 0.63 0.65  CALCIUM 8.5*  --  8.0* 7.8* 7.9* 8.1*  MG  --   --  2.1  --   --   --   PHOS  --   --  3.3  --   --   --    GFR Estimated Creatinine Clearance: 84.9 mL/min (by C-G formula based on SCr of 0.65 mg/dL). Liver Function Tests: No results for input(s): AST, ALT, ALKPHOS, BILITOT, PROT, ALBUMIN in the last 168 hours. No results for input(s): LIPASE, AMYLASE in the last 168 hours. No results for input(s): AMMONIA in the last 168 hours. Coagulation profile No results for input(s): INR, PROTIME in the last 168 hours.  CBC:  Recent Labs Lab 08/04/16 1228 08/04/16 1243 08/05/16 0454 08/06/16 0207 08/07/16 0232  WBC 19.4*  --  13.6* 12.6* 13.6*  HGB 11.4* 12.6* 10.9* 9.1* 9.0*  HCT 34.7* 37.0* 32.9* 27.6* 27.4*  MCV 84.8  --  83.5 83.1 83.8  PLT 288  --  300 299 320   Cardiac Enzymes:  Recent Labs Lab 08/04/16 1646 08/04/16 2126 08/05/16 0454 08/07/16 0747  TROPONINI 0.46* 3.31* 9.70* 4.99*   BNP (last 3 results) No results for input(s): PROBNP in the last 8760 hours. CBG:  Recent Labs Lab 08/06/16 2056 08/07/16 1027 08/07/16 1126 08/07/16 1630 08/07/16 2216  GLUCAP 89 85 98 129* 93   D-Dimer: No results for input(s): DDIMER in the last 72 hours. Hgb A1c: No results for input(s): HGBA1C in the last 72 hours. Lipid Profile:  Recent Labs  08/06/16 0811  CHOL 110  HDL 20*  LDLCALC 70  TRIG 99  CHOLHDL 5.5   Anemia work up:  Recent Labs  08/06/16 0811  VITAMINB12 4,482*  FERRITIN 899*  TIBC 203*  IRON 18*    Microbiology Recent Results (from the past 240 hour(s))  MRSA PCR Screening     Status: None   Collection Time: 08/04/16  7:17 PM  Result Value Ref Range Status   MRSA by PCR NEGATIVE NEGATIVE Final    Comment:        The GeneXpert MRSA Assay (FDA approved for NASAL specimens only), is one component of a comprehensive MRSA colonization surveillance program. It is not intended to diagnose  MRSA infection nor to guide or monitor treatment for MRSA infections.     Radiology: Ct Abdomen Pelvis W Contrast  Result Date: 08/07/2016 CLINICAL DATA:  Prostate cancer EXAM: CT ABDOMEN AND PELVIS WITH CONTRAST TECHNIQUE: Multidetector CT imaging of the abdomen and pelvis was performed using the standard protocol following bolus administration of intravenous contrast. CONTRAST:  165mL ISOVUE-300 IOPAMIDOL (ISOVUE-300) INJECTION 61% COMPARISON:  10/26/2014 CT FINDINGS: Lower chest: Stable cardiomegaly. Small posterior pericardial effusion measuring up to 12 mm in thickness. Trace bilateral pleural effusions minimal bibasilar atelectasis. Tiny 4 mm lingular nodular density, series 3, image 2. Hepatobiliary: Numerous hypodense peripherally enhancing hepatic masses consistent with metastatic disease throughout the right and left lobes and caudate. These range in size from 0.5 cm through 4.6 cm This is a new finding since prior CT. No biliary dilatation. Gallbladder is contracted without stones. Pancreas: 2.3 x 1.6 cm subtle masslike abnormality of the pancreatic body (series 3, image 32) with pancreatic ductal dilatation measuring up to 7 mm leading up to this lesion. The pancreatic head and uncinate process are grossly unremarkable. Spleen: Normal Adrenals/Urinary Tract: Stable bilateral adrenal nodules on the right measuring 2.5 x 1.3 cm and on the left, 2.1 x 1.3 cm. These were present on prior study and unchanged in appearance and may reflect adenomas. These precede the pancreatic and hepatic findings. Stomach/Bowel: The stomach is contracted. No small or large bowel dilatation or inflammation is identified. Appendix may be surgically absent. There is a surgical clip at the cecum. Vascular/Lymphatic: Enlarged porta hepatis lymph node measuring 2 x 0.9 cm. Small lymph node measuring 1.2 cm adjacent to the tail of the pancreas. No thrombosis noted of the splenic, superior mesenteric nor portal vein.  Reproductive: Prostate is unremarkable. Other: No abdominal wall hernia or abnormality. No abdominopelvic ascites. Musculoskeletal: Chronic remote compressions of L4 and L5 unchanged in appearance. No lytic or blastic disease. IMPRESSION: 1. Suspicious 2.3 x 1.6 cm masslike abnormality in the pancreatic body causing pancreatic ductal dilatation up to 7 mm in caliber leading up to this lesion. Adjacent porta hepatis and peripancreatic lymph nodes consistent with metastatic lymphadenopathy. 2. Numerous large hypodense enhancing metastatic lesions of the liver. These are likely amenable for percutaneous sampling if indicated. 3. Chronic stable bilateral adrenal nodules since 2016. Given their stability, these likely reflect benign findings such as adenomas as opposed to metastasis. 4. Small posterior pericardial effusion measuring up to 12 mm in thickness. 5. Tiny 4 mm lingular nodule. 6. Chronic mild compressions of L4 and L5.  Osteoblastic disease. Electronically Signed   By: Shanon Brow  Randel Pigg M.D.   On: 08/07/2016 00:47    Medications:   . chlorhexidine  15 mL Mouth Rinse BID  . mouth rinse  15 mL Mouth Rinse q12n4p  . pantoprazole  40 mg Oral Daily   Continuous Infusions: . heparin 1,600 Units/hr (08/07/16 2226)    Medical decision making is of high complexity and this patient is at high risk of deterioration, therefore this is a level 3 visit.  (> 4 problem points, 2 data points, high risk)    LOS: 4 days   Mry Jared Clements  [facility] Pager [phone]. If unable to reach me by pager, please call my cell phone at [phone].  *Please refer to [email or web address], password [email or web address] to get updated schedule on who will round on this patient, as hospitalists switch teams weekly. If 7PM-7AM, please contact night-coverage at [email or web address], password [email or web address] for any overnight needs.  08/08/2016, 7:22 AM

## 2016-08-09 ENCOUNTER — Inpatient Hospital Stay (HOSPITAL_COMMUNITY): Payer: Medicaid Other

## 2016-08-09 DIAGNOSIS — R970 Elevated carcinoembryonic antigen [CEA]: Secondary | ICD-10-CM

## 2016-08-09 LAB — COMPREHENSIVE METABOLIC PANEL
ALT: 26 U/L (ref 17–63)
AST: 34 U/L (ref 15–41)
Albumin: 1.9 g/dL — ABNORMAL LOW (ref 3.5–5.0)
Alkaline Phosphatase: 126 U/L (ref 38–126)
Anion gap: 7 (ref 5–15)
BUN: 9 mg/dL (ref 6–20)
CHLORIDE: 104 mmol/L (ref 101–111)
CO2: 23 mmol/L (ref 22–32)
CREATININE: 0.7 mg/dL (ref 0.61–1.24)
Calcium: 8.1 mg/dL — ABNORMAL LOW (ref 8.9–10.3)
Glucose, Bld: 91 mg/dL (ref 65–99)
Potassium: 4 mmol/L (ref 3.5–5.1)
Sodium: 134 mmol/L — ABNORMAL LOW (ref 135–145)
Total Bilirubin: 0.6 mg/dL (ref 0.3–1.2)
Total Protein: 6.6 g/dL (ref 6.5–8.1)

## 2016-08-09 LAB — GLUCOSE, CAPILLARY
GLUCOSE-CAPILLARY: 81 mg/dL (ref 65–99)
Glucose-Capillary: 78 mg/dL (ref 65–99)

## 2016-08-09 LAB — CBC
HCT: 27.6 % — ABNORMAL LOW (ref 39.0–52.0)
Hemoglobin: 9 g/dL — ABNORMAL LOW (ref 13.0–17.0)
MCH: 27.4 pg (ref 26.0–34.0)
MCHC: 32.6 g/dL (ref 30.0–36.0)
MCV: 83.9 fL (ref 78.0–100.0)
PLATELETS: 358 10*3/uL (ref 150–400)
RBC: 3.29 MIL/uL — ABNORMAL LOW (ref 4.22–5.81)
RDW: 14.6 % (ref 11.5–15.5)
WBC: 13.8 10*3/uL — AB (ref 4.0–10.5)

## 2016-08-09 LAB — APTT: aPTT: 118 seconds — ABNORMAL HIGH (ref 24–36)

## 2016-08-09 LAB — PROTIME-INR
INR: 1.35
PROTHROMBIN TIME: 16.8 s — AB (ref 11.4–15.2)

## 2016-08-09 LAB — HEPARIN LEVEL (UNFRACTIONATED): Heparin Unfractionated: 0.58 IU/mL (ref 0.30–0.70)

## 2016-08-09 MED ORDER — FENTANYL CITRATE (PF) 100 MCG/2ML IJ SOLN
INTRAMUSCULAR | Status: AC | PRN
  Administered 2016-08-09 (×2): 25 ug via INTRAVENOUS

## 2016-08-09 MED ORDER — MIDAZOLAM HCL 2 MG/2ML IJ SOLN
INTRAMUSCULAR | Status: AC | PRN
  Administered 2016-08-09: 1 mg via INTRAVENOUS

## 2016-08-09 MED ORDER — FENTANYL CITRATE (PF) 100 MCG/2ML IJ SOLN
INTRAMUSCULAR | Status: AC
  Filled 2016-08-09: qty 2

## 2016-08-09 MED ORDER — MIDAZOLAM HCL 2 MG/2ML IJ SOLN
INTRAMUSCULAR | Status: AC
  Filled 2016-08-09: qty 2

## 2016-08-09 MED ORDER — LIDOCAINE HCL 1 % IJ SOLN
INTRAMUSCULAR | Status: AC
  Filled 2016-08-09: qty 20

## 2016-08-09 NOTE — Sedation Documentation (Signed)
Patient is resting comfortably. 

## 2016-08-09 NOTE — Progress Notes (Signed)
Progress Note    Jared Clements  IWO:[id number] DOB: 12/28/1945  DOA: 08/04/2016 PCP: Harvie Junior, MD    Brief Narrative:   Chief complaint: Follow-up dyspnea  Medical records reviewed and are as summarized below.  Colan Aleshire is an 71 y.o. male with a PMH of prostate cancer status post prostatectomy and radiation therapy in 2013, hospital admission in 2016 for acute pancreatitis of unclear origin, hospital admission in 2014 for treatment of Escherichia coli acute pyelonephritis, hypertension who was admitted 08/04/16 by the critical care team for evaluation of hypoxic respiratory failure requiring BiPAP. CT angiogram of the chest showed bilateral pulmonary emboli with right heart strain.  Assessment/Plan:   Principal Problem:   Acute saddle pulmonary embolism with acute cor pulmonale (HCC) with acute hypoxic respiratory failure Initially required BiPAP. CTA Chest (Personally reviewed) revealed bilateral pulmonary emboli extensive on the right side, with right heart strain with RV/LV Ratio of 3.2. 2-D echo shows EF 55-60 percent, no regional wall motion abnormalities, mild RAE and RVE with mildly reduced RV function. Elevated pulmonary pressures. Suspect trigger for PE is active malignancy. Metastasis noted to the liver and possibly the lung on imaging studies. Remains on IV heparin in anticipation of liver biopsy.  Can transition to Eliquis post biopsy.    Active Problems:   Troponin elevation/demand ischemia Significant bump in troponin, but has trended down. Echocardiogram results as noted above. Heparinized at present. Likely demand ischemia in the setting of extensive pulmonary emboli. 12-lead EKG personally reviewed and showed nonspecific repolarization abnormalities in the inferior lead and minimal ST elevations in the lateral leads. Patient is at risk for heart disease given his aortic calcifications, history of hypertension and hyperlipidemia, age, and prediabetes. Will need an  outpatient stress test when he is more medically stable.    Hypokalemia Repleted.    Hyponatremia Mild. Monitor.    H/O HLD (hyperlipidemia) Not on medication. Fasting lipid panel: Cholesterol 110, LDL 70.    Essential hypertension Managed with amlodipine and Norvasc at home. Antihypertensives currently on hold. Blood pressure currently controlled.    History of prostate cancer Diagnosed in 2013, status post surgical resection and radiation therapy.    Aortic calcification (HCC) Checked fasting lipid panel: Cholesterol 110, LDL 70. Heparinized, so would not put on aspirin at this time.    Solitary pulmonary nodule/liver nodularity/metastatic disease  3. 5 mm pulmonary nodule in the anterior right lung base and liver nodularity noted on CT. No follow-up of the pulmonary nodule needed if patient is low-risk. Non-contrast chest CT can be considered in 12 months if patient is high-risk. Patient is a nonsmoker, but he does have a history of prostate cancer. CT of the abdomen and pelvis shows a pancreatic mass (pictured below), metastatic lymphadenopathy, and liver metastasis. CT biopsy planned.  CEA mildly elevated at 6.2, CA 19-9 pending.      Normocytic anemia/anemia of chronic disease B-12 4482, ferritin 899, iron 18, TIBC 203. Findings are consistent with anemia of chronic disease.    Impaired fasting glucose/prediabetes Hemoglobin A1c is 6.2%, corresponding with prediabetes. Dietitian has provided diet counseling.   Moderate malnutrition/weight loss Body mass index is 23.1 kg/m. Evaluated by dietitian. Continue nutritional supplements.   Family Communication/Anticipated D/C date and plan/Code Status   DVT prophylaxis: Heparin. Code Status: Full Code.  Family Communication: Son updated at the bedside. Disposition Plan: Home when switched to oral blood thinners and hemodynamically stable, possibly 08/09/16.   Medical Consultants:    Pulmonology  Procedures:     None  Anti-Infectives:    None  Subjective:   Mr. Hutchinson continues to feel that his shortness of breath has improved.  No bleeding. No chest pain. Biopsy is scheduled for this afternoon.  Objective:    Vitals:   08/08/16 0500 08/08/16 1505 08/08/16 2100 08/09/16 0611  BP: 140/83 120/68 131/71 (!) 144/88  Pulse: 100 95 100 98  Resp: 20 20 18 20   Temp: 97.1 F (36.2 C) 97.5 F (36.4 C) 98.4 F (36.9 C) 98.6 F (37 C)  TempSrc: Oral Oral Oral Oral  SpO2: 100% 100% 100% 100%  Weight: 73.5 kg (162 lb 1.6 oz)   73 kg (161 lb)  Height:        Intake/Output Summary (Last 24 hours) at 08/09/16 0716 Last data filed at 08/08/16 2019  Gross per 24 hour  Intake              720 ml  Output                0 ml  Net              720 ml   Filed Weights   08/07/16 0633 08/08/16 0500 08/09/16 0611  Weight: 73.9 kg (162 lb 14.4 oz) 73.5 kg (162 lb 1.6 oz) 73 kg (161 lb)    Exam: General exam: Appears calm and comfortable. Cachectic appearing. Respiratory system: Bibasilar crackles persist. Respiratory effort normal. Cardiovascular system: S1 & S2 heard, RRR. No JVD,  rubs, gallops or clicks. No murmurs. Gastrointestinal system: Abdomen is nondistended, soft and nontender. No organomegaly or masses felt. Normal bowel sounds heard. Central nervous system: Alert and oriented. No focal neurological deficits. Extremities: No clubbing,  or cyanosis. No edema. Skin: No rashes, lesions or ulcers. Psychiatry: Judgement and insight appear normal. Mood & affect flat.   Data Reviewed:   I have personally reviewed following labs and imaging studies:  Labs: Basic Metabolic Panel:  Recent Labs Lab 08/05/16 0454 08/06/16 0207 08/07/16 0232 08/08/16 0339 08/09/16 0139  NA 132* 132* 132* 132* 134*  K 4.2 3.7 3.6 3.4* 4.0  CL 102 103 103 103 104  CO2 19* 21* 22 22 23   GLUCOSE 106* 105* 91 93 91  BUN 15 13 8 7 9   CREATININE 0.68 0.63 0.63 0.65 0.70  CALCIUM 8.0* 7.8* 7.9* 8.1*  8.1*  MG 2.1  --   --   --   --   PHOS 3.3  --   --   --   --    GFR Estimated Creatinine Clearance: 84.9 mL/min (by C-G formula based on SCr of 0.7 mg/dL). Liver Function Tests:  Recent Labs Lab 08/08/16 1120 08/09/16 0139  AST 33 34  ALT 25 26  ALKPHOS 128* 126  BILITOT 0.9 0.6  PROT 6.4* 6.6  ALBUMIN 1.9* 1.9*   No results for input(s): LIPASE, AMYLASE in the last 168 hours. No results for input(s): AMMONIA in the last 168 hours. Coagulation profile  Recent Labs Lab 08/09/16 0139  INR 1.35    CBC:  Recent Labs Lab 08/05/16 0454 08/06/16 0207 08/07/16 0232 08/08/16 0339 08/09/16 0139  WBC 13.6* 12.6* 13.6* 14.6* 13.8*  HGB 10.9* 9.1* 9.0* 8.8* 9.0*  HCT 32.9* 27.6* 27.4* 27.4* 27.6*  MCV 83.5 83.1 83.8 84.0 83.9  PLT 300 299 320 293 358   Cardiac Enzymes:  Recent Labs Lab 08/04/16 1646 08/04/16 2126 08/05/16 0454 08/07/16 0747  TROPONINI 0.46* 3.31* 9.70* 4.99*  BNP (last 3 results) No results for input(s): PROBNP in the last 8760 hours. CBG:  Recent Labs Lab 08/07/16 1630 08/07/16 2216 08/08/16 1138 08/08/16 1645 08/08/16 2127  GLUCAP 129* 93 84 92 104*   D-Dimer: No results for input(s): DDIMER in the last 72 hours. Hgb A1c: No results for input(s): HGBA1C in the last 72 hours. Lipid Profile:  Recent Labs  08/06/16 0811  CHOL 110  HDL 20*  LDLCALC 70  TRIG 99  CHOLHDL 5.5   Anemia work up:  Recent Labs  08/06/16 0811  VITAMINB12 4,482*  FERRITIN 899*  TIBC 203*  IRON 18*    Microbiology Recent Results (from the past 240 hour(s))  MRSA PCR Screening     Status: None   Collection Time: 08/04/16  7:17 PM  Result Value Ref Range Status   MRSA by PCR NEGATIVE NEGATIVE Final    Comment:        The GeneXpert MRSA Assay (FDA approved for NASAL specimens only), is one component of a comprehensive MRSA colonization surveillance program. It is not intended to diagnose MRSA infection nor to guide or monitor treatment  for MRSA infections.     Radiology: No results found.  Medications:   . chlorhexidine  15 mL Mouth Rinse BID  . mouth rinse  15 mL Mouth Rinse q12n4p  . pantoprazole  40 mg Oral Daily   Continuous Infusions: . heparin Stopped (08/09/16 9244)    Medical decision making is of high complexity and this patient is at high risk of deterioration, therefore this is a level 3 visit.  (> 4 problem points, 1 data point, high risk)    LOS: 5 days   Ashton Belote  [facility] Pager [phone]. If unable to reach me by pager, please call my cell phone at [phone].  *Please refer to [email or web address], password [email or web address] to get updated schedule on who will round on this patient, as hospitalists switch teams weekly. If 7PM-7AM, please contact night-coverage at [email or web address], password [email or web address] for any overnight needs.  08/09/2016, 7:16 AM

## 2016-08-09 NOTE — Progress Notes (Signed)
ANTICOAGULATION CONSULT NOTE - Follow Up Consult  Pharmacy Consult for Heparin Indication: pulmonary embolus  No Known Allergies  Patient Measurements: Height: 5\' 10"  (177.8 cm) Weight: 162 lb 1.6 oz (73.5 kg) IBW/kg (Calculated) : 73 Heparin Dosing Weight:  77.1 kg  Vital Signs: Temp: 98.4 F (36.9 C) (03/11 2100) Temp Source: Oral (03/11 2100) BP: 131/71 (03/11 2100) Pulse Rate: 100 (03/11 2100)  Labs:  Recent Labs  08/06/16 1435  08/07/16 0232 08/07/16 0747 08/08/16 0339 08/08/16 1120 08/09/16 0139  HGB  --   < > 9.0*  --  8.8*  --  9.0*  HCT  --   --  27.4*  --  27.4*  --  27.6*  PLT  --   --  320  --  293  --  358  APTT  --   --   --   --  88* 102* 118*  LABPROT  --   --   --   --   --   --  16.8*  INR  --   --   --   --   --   --  1.35  HEPARINUNFRC 0.51  --  0.40  --  1.44*  --   --   CREATININE  --   --  0.63  --  0.65  --  0.70  TROPONINI  --   --   --  4.99*  --   --   --   < > = values in this interval not displayed.  Estimated Creatinine Clearance: 84.9 mL/min (by C-G formula based on SCr of 0.7 mg/dL).   Assessment: 71 yo male with PE. Not an EKOS candidate d/t multiple liver lesions suspicious for metastatic cancer. He was changed to [place] (received one dose of 10mg  3/10 at 0900)  but now plans for biopsy so changed back to heparin bridge. Now utilizing heparin levels as heparin level therapeutic (0.58) while PTT 118 sec.  Plan for biopsy in IR today - heparin to be stopped 6 hours prior to IR. RN to call IR this morning to see what time pt is scheduled.  Goal of Therapy:  Heparin level 0.3-0.7 units/ml   Monitor platelets by anticoagulation protocol: Yes   Plan:  Continue heparin at 1550 units/hr F/u restart of heparin or apixaban post biopsy  Sherlon Handing, PharmD, BCPS Clinical pharmacist, pager [phone]  08/09/2016 3:58 AM

## 2016-08-09 NOTE — Progress Notes (Signed)
Pt has Medicaid insurance-  Eliquis is on the preferred drug list and will be covered.

## 2016-08-09 NOTE — Procedures (Signed)
Interventional Radiology Procedure Note  Procedure: US guided liver biopsy  Complications: None  Estimated Blood Loss: < 10 mL  Lesion in left lobe of liver targeted for biopsy.  18 G core biopsy x 3 via 17 G needle.  Jared Clements. Kathlene Cote, M.D Pager:  [phone]

## 2016-08-10 LAB — CBC
HEMATOCRIT: 28.4 % — AB (ref 39.0–52.0)
Hemoglobin: 9.2 g/dL — ABNORMAL LOW (ref 13.0–17.0)
MCH: 27 pg (ref 26.0–34.0)
MCHC: 32.4 g/dL (ref 30.0–36.0)
MCV: 83.3 fL (ref 78.0–100.0)
Platelets: 303 10*3/uL (ref 150–400)
RBC: 3.41 MIL/uL — ABNORMAL LOW (ref 4.22–5.81)
RDW: 14.7 % (ref 11.5–15.5)
WBC: 13.3 10*3/uL — ABNORMAL HIGH (ref 4.0–10.5)

## 2016-08-10 LAB — GLUCOSE, CAPILLARY
GLUCOSE-CAPILLARY: 107 mg/dL — AB (ref 65–99)
Glucose-Capillary: 115 mg/dL — ABNORMAL HIGH (ref 65–99)
Glucose-Capillary: 142 mg/dL — ABNORMAL HIGH (ref 65–99)

## 2016-08-10 LAB — HEPARIN LEVEL (UNFRACTIONATED): Heparin Unfractionated: 0.25 IU/mL — ABNORMAL LOW (ref 0.30–0.70)

## 2016-08-10 LAB — APTT: aPTT: 95 seconds — ABNORMAL HIGH (ref 24–36)

## 2016-08-10 MED ORDER — AMLODIPINE BESYLATE 10 MG PO TABS: 10.0000 mg | ORAL_TABLET | Freq: Every day | ORAL | 1 refills | Status: DC

## 2016-08-10 MED ORDER — APIXABAN 5 MG PO TABS
10.0000 mg | ORAL_TABLET | Freq: Two times a day (BID) | ORAL | Status: DC
  Administered 2016-08-10: 10 mg via ORAL
  Filled 2016-08-10: qty 2

## 2016-08-10 MED ORDER — APIXABAN 5 MG PO TABS: 10.0000 mg | ORAL_TABLET | Freq: Two times a day (BID) | ORAL | 1 refills | Status: DC

## 2016-08-10 MED ORDER — PANTOPRAZOLE SODIUM 40 MG PO TBEC: 40.0000 mg | DELAYED_RELEASE_TABLET | Freq: Every day | ORAL | 2 refills | Status: DC

## 2016-08-10 MED ORDER — APIXABAN 5 MG PO TABS: 5.0000 mg | ORAL_TABLET | Freq: Two times a day (BID) | ORAL | Status: DC

## 2016-08-10 NOTE — Progress Notes (Signed)
08/10/2016 5:04 PM Discharge AVS meds taken today and those due this evening reviewed.  Follow-up appointments and when to call md reviewed.  D/C IV and TELE.  Questions and concerns addressed.   D/C home per orders. [place]

## 2016-08-10 NOTE — Progress Notes (Signed)
ANTICOAGULATION CONSULT NOTE - Follow Up Consult  Pharmacy Consult for Heparin > apixaban Indication: pulmonary embolus  No Known Allergies  Patient Measurements: Height: 5\' 10"  (177.8 cm) Weight: 150 lb 4.8 oz (68.2 kg) IBW/kg (Calculated) : 73 Heparin Dosing Weight:  77.1 kg  Vital Signs: Temp: 98.6 F (37 C) (03/13 0525) Temp Source: Oral (03/13 0525) BP: 116/73 (03/13 0525) Pulse Rate: 103 (03/13 0525)  Labs:  Recent Labs  08/07/16 0747  08/08/16 0339 08/08/16 1120 08/09/16 0139 08/10/16 0205  HGB  --   < > 8.8*  --  9.0* 9.2*  HCT  --   --  27.4*  --  27.6* 28.4*  PLT  --   --  293  --  358 303  APTT  --   < > 88* 102* 118* 95*  LABPROT  --   --   --   --  16.8*  --   INR  --   --   --   --  1.35  --   HEPARINUNFRC  --   --  1.44*  --  0.58 0.25*  CREATININE  --   --  0.65  --  0.70  --   TROPONINI 4.99*  --   --   --   --   --   < > = values in this interval not displayed.  Estimated Creatinine Clearance: 79.3 mL/min (by C-G formula based on SCr of 0.7 mg/dL).   Assessment: 71 yo male with PE. Not an EKOS candidate d/t multiple liver lesions suspicious for metastatic cancer. He was changed to apixaban (received one dose of 10mg  3/10 at 0900)  S/p biopsy 3/12 so bridging with heparin. Now Pharmacy consulted to transition back to apixaban. CBC stable. No bleed issues reported.  Goal of Therapy:  Heparin level 0.3-0.7 units/ml   Monitor platelets by anticoagulation protocol: Yes   Plan:  D/c heparin at time of 1st dose of apixaban - communicated plan with RN Apixaban 10mg  PO BID x 7 days; then 5mg  PO BID Monitor CBC, s/sx bleeding  Elicia Lamp, PharmD, BCPS Clinical Pharmacist 08/10/2016 8:20 AM

## 2016-08-10 NOTE — Discharge Instructions (Addendum)
Pulmonary Embolism A pulmonary embolism (PE) is a sudden blockage or decrease of blood flow in one lung or both lungs. Most blockages come from a blood clot that travels from the legs or the pelvis to the lungs. PE is a dangerous and potentially life-threatening condition if it is not treated right away. What are the causes? A pulmonary embolism occurs most commonly when a blood clot travels from one of your veins to your lungs. Rarely, PE is caused by air, fat, amniotic fluid, or part of a tumor traveling through your veins to your lungs. What increases the risk? A PE is more likely to develop in:  People who smoke.  People who areolder, especially over 16 years of age.  People who are overweight (obese).  People who sit or lie still for a long time, such as during long-distance travel (over 4 hours), bed rest, hospitalization, or during recovery from certain medical conditions like a stroke.  People who do not engage in much physical activity (sedentary lifestyle).  People who have chronic breathing disorders.  People whohave a personal or family history of blood clots or blood clotting disease.  People whohave peripheral vascular disease (PVD), diabetes, or some types of cancer.  People who haveheart disease, especially if the person had a recent heart attack or has congestive heart failure.  People who have neurological diseases that affect the legs (leg paresis).  People who have had a traumatic injury, such as breaking a hip or leg.  People whohave recently had major or lengthy surgery, especially on the hip, knee, or abdomen.  People who have hada central line placed inside a large vein.  People who takemedicines that contain the hormone estrogen. These include birth control pills and hormone replacement therapy.  Pregnancy or during childbirth or the postpartum period. What are the signs or symptoms? The symptoms of a PE usually start suddenly and  include:  Shortness of breath while active or at rest.  Coughing or coughing up blood or blood-tinged mucus.  Chest pain that is often worse with deep breaths.  Rapid or irregular heartbeat.  Feeling light-headed or dizzy.  Fainting.  Feelinganxious.  Sweating. There may also be pain and swelling in a leg if that is where the blood clot started. These symptoms may represent a serious problem that is an emergency. Do not wait to see if the symptoms will go away. Get medical help right away. Call your local emergency services (911 in the [place] Do not drive yourself to the hospital.  How is this diagnosed? Your health care provider will take a medical history and perform a physical exam. You may also have other tests, including:  Blood tests to assess the clotting properties of your blood, assess oxygen levels in your blood, and find blood clots.  Imaging tests, such as CT, ultrasound, MRI, X-ray, and other tests to see if you have clots anywhere in your body.  An electrocardiogram (ECG) to look for heart strain from blood clots in the lungs. How is this treated? The main goals of PE treatment are:  To stop a blood clot from growing larger.  To stop new blood clots from forming. The type of treatment that you receive depends on many factors, such as the cause of your PE, your risk for bleeding or developing more clots, and other medical conditions that you have. Sometimes, a combination of treatments is necessary. This condition may be treated with:  Medicines, including newer oral blood thinners (anticoagulants), warfarin,  low molecular weight heparins, thrombolytics, or heparins.  Wearing compression stockings or using different types of devices.  Surgery (rare) to remove the blood clot or to place a filter in your abdomen to stop the blood clot from traveling to your lungs. Treatments for a PE are often divided into immediate treatment, long-term treatment (up to 3 months  after PE), and extended treatment (more than 3 months after PE). Your treatment may continue for several months. This is called maintenance therapy, and it is used to prevent the forming of new blood clots. You can work with your health care provider to choose the treatment program that is best for you. What are anticoagulants?  Anticoagulants are medicines that treat PEs. They can stop current blood clots from growing and stop new clots from forming. They cannot dissolve existing clots. Your body dissolves clots by itself over time. Anticoagulants are given by mouth, by injection, or through an IV tube. What are thrombolytics?  Thrombolytics are clot-dissolving medicines that are used to dissolve a PE. They carry a high risk of bleeding, so they tend to be used only in severe cases or if you have very low blood pressure. Follow these instructions at home: If you are taking a newer oral anticoagulant:   Take the medicine every single day at the same time each day.  Understand what foods and drugs interact with this medicine.  Understand that there are no regular blood tests required when using this medicine.  Understandthe side effects of this medicine, including excessive bruising or bleeding. Ask your health care provider or pharmacist about other possible side effects. If you are taking warfarin:   Understand how to take warfarin and know which foods can affect how warfarin works in Veterinary surgeon.  Understand that it is dangerous to taketoo much or too little warfarin. Too much warfarin increases the risk of bleeding. Too little warfarin continues to allow the risk for blood clots.  Follow your PT and INR blood testing schedule. The PT and INR results allow your health care provider to adjust your dose of warfarin. It is very important that you have your PT and INR tested as often as told by your health care provider.  Avoid major changes in your diet, or tell your health care provider before  you change your diet. Arrange a visit with a registered dietitian to answer your questions. Many foods, especially foods that are high in vitamin K, can interfere with warfarin and affect the PT and INR results. Eat a consistent amount of foods that are high in vitamin K, such as:  Spinach, kale, broccoli, cabbage, collard greens, turnip greens, Brussels sprouts, peas, cauliflower, seaweed, and parsley.  Beef liver and pork liver.  Green tea.  Soybean oil.  Tell your health care provider about any and all medicines, vitamins, and supplements that you take, including aspirin and other over-the-counter anti-inflammatory medicines. Be especially cautious with aspirin and anti-inflammatory medicines. Do not take those before you ask your health care provider if it is safe to do so. This is important because many medicines can interfere with warfarin and affect the PT and INR results.  Do not start or stop taking any over-the-counter or prescription medicine unless your health care provider or pharmacist tells you to do so. If you take warfarin, you will also need to do these things:  Hold pressure over cuts for longer than usual.  Tell your dentist and other health care providers that you are taking warfarin before you  have any procedures in which bleeding may occur.  Avoid alcohol or drink very small amounts. Tell your health care provider if you change your alcohol intake.  Do not use tobacco products, including cigarettes, chewing tobacco, and e-cigarettes. If you need help quitting, ask your health care provider.  Avoid contact sports. General instructions   Take over-the-counter and prescription medicines only as told by your health care provider. Anticoagulant medicines can have side effects, including easy bruising and difficulty stopping bleeding. If you are prescribed an anticoagulant, you will also need to do these things:  Hold pressure over cuts for longer than usual.  Tell your  dentist and other health care providers that you are taking anticoagulants before you have any procedures in which bleeding may occur.  Avoid contact sports.  Wear a medical alert bracelet or carry a medical alert card that says you have had a PE.  Ask your health care provider how soon you can go back to your normal activities. Stay active to prevent new blood clots from forming.  Make sure to exercise while traveling or when you have been sitting or standing for a long period of time. It is very important to exercise. Exercise your legs by walking or by tightening and relaxing your leg muscles often. Take frequent walks.  Wear compression stockings as told by your health care provider to help prevent more blood clots from forming.  Do not use tobacco products, including cigarettes, chewing tobacco, and e-cigarettes. If you need help quitting, ask your health care provider.  Keep all follow-up appointments with your health care provider. This is important. How is this prevented? Take these actions to decrease your risk of developing another PE:  Exercise regularly. For at least 30 minutes every day, engage in:  Activity that involves moving your arms and legs.  Activity that encourages good blood flow through your body by increasing your heart rate.  Exercise your arms and legs every hour during long-distance travel (over 4 hours). Drink plenty of water and avoid drinking alcohol while traveling.  Avoid sitting or lying in bed for long periods of time without moving your legs.  Maintain a weight that is appropriate for your height. Ask your health care provider what weight is healthy for you.  If you are a woman who is over 12 years of age, avoid unnecessary use of medicines that contain estrogen. These include birth control pills.  Do not smoke, especially if you take estrogen medicines. If you need help quitting, ask your health care provider.  If you are at very high risk for  PE, wear compression stockings.  If you recently had a PE, have regularly scheduled ultrasound testing on your legs to check for new blood clots. If you are hospitalized, prevention measures may include:  Early walking after surgery, as soon as your health care provider says that it is safe.  Receiving anticoagulants to prevent blood clots. If you cannot take anticoagulants, other options may be available, such as wearing compression stockings or using different types of devices. Get help right away if:  You have new or increased pain, swelling, or redness in an arm or leg.  You have numbness or tingling in an arm or leg.  You have shortness of breath while active or at rest.  You have chest pain.  You have a rapid or irregular heartbeat.  You feel light-headed or dizzy.  You cough up blood.  You notice blood in your vomit, bowel movement, or urine.  You have a fever. These symptoms may represent a serious problem that is an emergency. Do not wait to see if the symptoms will go away. Get medical help right away. Call your local emergency services (911 in the [place] Do not drive yourself to the hospital. This information is not intended to replace advice given to you by your health care provider. Make sure you discuss any questions you have with your health care provider. Document Released: 05/14/2000 Document Revised: 10/23/2015 Document Reviewed: 09/11/2014 Elsevier Interactive Patient Education  2017 [place] on my medicine - ELIQUIS (apixaban)  This medication education was reviewed with me or my healthcare representative as part of my discharge preparation.  Why was Eliquis prescribed for you? Eliquis was prescribed to treat blood clots that may have been found in the veins of your legs (deep vein thrombosis) or in your lungs (pulmonary embolism) and to reduce the risk of them occurring again.  What do You need to know about Eliquis ? The starting dose  is 10 mg (two 5 mg tablets) taken TWICE daily for the FIRST SEVEN (7) DAYS, then on (enter date)  08/17/2016  the dose is reduced to ONE 5 mg tablet taken TWICE daily.  Eliquis may be taken with or without food.   Try to take the dose about the same time in the morning and in the evening. If you have difficulty swallowing the tablet whole please discuss with your pharmacist how to take the medication safely.  Take Eliquis exactly as prescribed and DO NOT stop taking Eliquis without talking to the doctor who prescribed the medication.  Stopping may increase your risk of developing a new blood clot.  Refill your prescription before you run out.  After discharge, you should have regular check-up appointments with your healthcare provider that is prescribing your Eliquis.    What do you do if you miss a dose? If a dose of ELIQUIS is not taken at the scheduled time, take it as soon as possible on the same day and twice-daily administration should be resumed. The dose should not be doubled to make up for a missed dose.  Important Safety Information A possible side effect of Eliquis is bleeding. You should call your healthcare provider right away if you experience any of the following: ? Bleeding from an injury or your nose that does not stop. ? Unusual colored urine (red or dark brown) or unusual colored stools (red or black). ? Unusual bruising for unknown reasons. ? A serious fall or if you hit your head (even if there is no bleeding).  Some medicines may interact with Eliquis and might increase your risk of bleeding or clotting while on Eliquis. To help avoid this, consult your healthcare provider or pharmacist prior to using any new prescription or non-prescription medications, including herbals, vitamins, non-steroidal anti-inflammatory drugs (NSAIDs) and supplements.  This website has more information on Eliquis (apixaban): [email or web address]://[email or web address].[email or web address]

## 2016-08-10 NOTE — Discharge Summary (Signed)
Physician Discharge Summary  Jared Clements JYN:[id number] DOB: 22-Mar-1946 DOA: 08/04/2016  PCP: Harvie Junior, MD  Admit date: 08/04/2016 Discharge date: 08/10/2016  Admitted From: Home Discharge disposition: Home   Recommendations for Outpatient Follow-Up:   1. The patient needs close F/U with review of biopsy results (liver bx done 08/09/16)   Discharge Diagnosis:   Principal Problem:    Acute saddle pulmonary embolism with acute cor pulmonale (HCC) Active Problems:    Hyponatremia    Hypokalemia    HLD (hyperlipidemia)    Essential hypertension    History of prostate cancer    Aortic calcification (HCC)    Solitary pulmonary nodule    Acute respiratory failure with hypoxia (HCC)    Impaired fasting glucose    Prediabetes    Elevated troponin    Demand ischemia (HCC)    Malnutrition of moderate degree    Anemia of chronic disease    Metastasis (HCC)    Elevated CEA  Discharge Condition: Improved.  Diet recommendation: Low sodium, heart healthy.    History of Present Illness:    Jared Clements is an 71 y.o. male with a PMH of prostate cancer status post prostatectomy and radiation therapy in 2013, hospital admission in 2016 for acute pancreatitis of unclear origin, hospital admission in 2014 for treatment of Escherichia coli acute pyelonephritis, hypertension who was admitted 08/04/16 by the critical care team for evaluation of hypoxic respiratory failure requiring BiPAP. CT angiogram of the chest showed bilateral pulmonary emboli with right heart strain.   Hospital Course by Problem:   Principal Problem:   Acute saddle pulmonary embolism with acute cor pulmonale (HCC) with acute hypoxic respiratory failure Initially required BiPAP. CTA Chest (Personally reviewed) revealed bilateral pulmonary emboli extensive on the right side, with right heart strain with RV/LV Ratio of 3.2. 2-D echo showed EF 55-60 percent, no regional wall motion abnormalities, mild  RAE and RVE with mildly reduced RV function. Elevated pulmonary pressures. Suspect trigger for PE is active malignancy. Metastasis noted to the liver and possibly the lung on imaging studies. Discharge home on Eliquis, s/p liver biopsy 08/09/16.    Active Problems:   Troponin elevation/demand ischemia/aortic calcification Significant bump in troponin, but has trended down. Echocardiogram results as noted above.  Likely demand ischemia in the setting of extensive pulmonary emboli. 12-lead EKG personally reviewed and showed nonspecific repolarization abnormalities in the inferior lead and minimal ST elevations in the lateral leads. Patient is at risk for heart disease given his aortic calcifications, history of hypertension and hyperlipidemia, age, and prediabetes. Will need an outpatient stress test when he is more medically stable.    Hypokalemia Repleted.    Hyponatremia Mild. Monitor.    H/O HLD (hyperlipidemia) Not on medication. Fasting lipid panel: Cholesterol 110, LDL 70.    Essential hypertension Managed with Norvasc at home. Resume at discharge.    History of prostate cancer Diagnosed in 2013, status post surgical resection and radiation therapy.    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 5 mm pulmonary nodule in the anterior right lung base and liver nodularity noted on CT. CT of the abdomen and pelvis showed a pancreatic mass (pictured below), metastatic lymphadenopathy, and liver metastasis. CT biopsy done 08/09/16.  CEA mildly elevated at 6.2, CA 19-9 1 (WNL). Will need close outpatient follow up to discuss pathology results.      Normocytic anemia/anemia of chronic disease B-12 4482, ferritin 899, iron 18, TIBC 203. Findings are consistent with anemia  of chronic disease.    Impaired fasting glucose/prediabetes Hemoglobin A1c is 6.2%, corresponding with prediabetes. Dietitian has provided diet counseling.    Moderate malnutrition/weight loss Body mass index is 21.57 kg/m. Evaluated by dietitian. Continue nutritional supplements.  Medical Consultants:    IR  Pulmonology   Discharge Exam:   Vitals:   08/10/16 1047 08/10/16 1335  BP: 131/77 126/73  Pulse: (!) 114 96  Resp: (!) 23 (!) 21  Temp: 98 F (36.7 C) 98.6 F (37 C)   Vitals:   08/10/16 0525 08/10/16 1035 08/10/16 1047 08/10/16 1335  BP: 116/73  131/77 126/73  Pulse: (!) 103  (!) 114 96  Resp: 20  (!) 23 (!) 21  Temp: 98.6 F (37 C)  98 F (36.7 C) 98.6 F (37 C)  TempSrc: Oral  Oral Oral  SpO2: 100% 100% 92% 100%  Weight: 68.2 kg (150 lb 4.8 oz)     Height:       General exam: Appears calm and comfortable. Cachectic appearing. Respiratory system: Bibasilar crackles persist. Respiratory effort normal. Cardiovascular system: S1 & S2 heard, RRR. No JVD,  rubs, gallops or clicks. No murmurs. Gastrointestinal system: Abdomen is nondistended, soft and nontender. No organomegaly or masses felt. Normal bowel sounds heard. Central nervous system: Alert and oriented. No focal neurological deficits. Extremities: No clubbing,  or cyanosis. No edema. Skin: No rashes, lesions or ulcers. Psychiatry: Judgement and insight appear normal. Mood & affect flat.    The results of significant diagnostics from this hospitalization (including imaging, microbiology, ancillary and laboratory) are listed below for reference.     Procedures and Diagnostic Studies:   Ct Angio Chest Pe W And/or Wo Contrast  Addendum Date: 08/04/2016   ADDENDUM REPORT: 08/04/2016 15:56 ADDENDUM: Additional clinical information, remote history of prostate cancer. Upon re-review of images, multiple vague low-attenuation lesions in the liver suspicious for metastatic lesions. Dedicated CT abdomen pelvis with contrast would better evaluate the liver lesions. Electronically Signed   By: Donavan Foil M.D.   On: 08/04/2016 15:56   Result Date:  08/04/2016 CLINICAL DATA:  Shortness of breath and chest pain EXAM: CT ANGIOGRAPHY CHEST WITH CONTRAST TECHNIQUE: Multidetector CT imaging of the chest was performed using the standard protocol during bolus administration of intravenous contrast. Multiplanar CT image reconstructions and MIPs were obtained to evaluate the vascular anatomy. CONTRAST:  100 mL Isovue 370 intravenous COMPARISON:  Chest x-ray 08/04/2016, CT chest 04/12/2012 FINDINGS: Cardiovascular: Satisfactory opacification of the pulmonary arteries to the segmental level. Extensive filling defect within right lower lobe lobar, segmental, and subsegmental arteries. Additional filling defects within segmental and subsegmental branches of the right upper and middle lobe arterial branches. Filling defect also present within left upper lobe lobar, segmental and subsegmental branches with additional small filling defects present within segmental and subsegmental left lower lobe arterial branches. RV LV ratio elevated at 3.2. Small pericardial effusion. Mildly ectatic ascending aorta. Bovine arch variant. Atherosclerotic calcifications. Mediastinum/Nodes: Trachea is midline. Thyroid grossly unremarkable. Scattered subcentimeter lymph nodes in the mediastinum, grossly unchanged, for example precarinal lymph node measures 1 cm. Ill-defined low density within the bilateral hilar regions, uncertain if this is all due to thrombus. The esophagus is grossly unremarkable. Lungs/Pleura: Mild hazy density in the perihilar regions. No consolidation, effusion or pneumothorax. Within the anterior right lung base, there is a 6 x 4 mm oval nodule, 5 mm average diameter, series 6, image number 100. Upper Abdomen: No acute abnormality in the upper abdomen, possible mildly nodular contour  of the liver. Mild gynecomastia. Musculoskeletal: Degenerative changes of the spine. No acute or suspicious bone lesions. Review of the MIP images confirms the above findings. IMPRESSION: 1.  The study is positive for acute bilateral pulmonary emboli, extensive on the right side. Positive for acute PE with CT evidence of right heart strain (RV/LV Ratio = 3.2) consistent with at least submassive (intermediate risk) PE. The presence of right heart strain has been associated with an increased risk of morbidity and mortality. Please activate Code PE by paging [phone]. 2. Ill-defined soft tissue density within the bilateral hilar regions, uncertain if this is all secondary to thrombus, suggest short interval CT follow-up to exclude hilar adenopathy or possible developing soft tissue density in the left greater than right hila. 3. 5 mm pulmonary nodule in the anterior right lung base. No follow-up needed if patient is low-risk. Non-contrast chest CT can be considered in 12 months if patient is high-risk. This recommendation follows the consensus statement: Guidelines for Management of Incidental Pulmonary Nodules Detected on CT Images: From the Fleischner Society 2017; Radiology 2017; 284:228-243. 4. Mild hazy attenuation within the bilateral perihilar regions could relate to mild edema. Critical Value/emergent results were called by telephone at the time of interpretation on 08/04/2016 at 2:55 pm to Dr. Deno Etienne , who verbally acknowledged these results. Electronically Signed: By: Donavan Foil M.D. On: 08/04/2016 14:55   Dg Chest Port 1 View  Result Date: 08/04/2016 CLINICAL DATA:  Chest pain, shortness of Breath EXAM: PORTABLE CHEST 1 VIEW COMPARISON:  10/25/2014 FINDINGS: Cardiomediastinal silhouette is stable. No infiltrate or pulmonary edema. Stable atelectasis or scarring in lingula. Mild elevation of the right hemidiaphragm again noted. IMPRESSION: No active disease. Stable atelectasis or scarring in lingula. Again noted mild elevation of the right hemidiaphragm. Electronically Signed   By: Lahoma Crocker M.D.   On: 08/04/2016 13:18     Labs:   Basic Metabolic Panel:  Recent Labs Lab  08/05/16 0454 08/06/16 0207 08/07/16 0232 08/08/16 0339 08/09/16 0139  NA 132* 132* 132* 132* 134*  K 4.2 3.7 3.6 3.4* 4.0  CL 102 103 103 103 104  CO2 19* 21* 22 22 23   GLUCOSE 106* 105* 91 93 91  BUN 15 13 8 7 9   CREATININE 0.68 0.63 0.63 0.65 0.70  CALCIUM 8.0* 7.8* 7.9* 8.1* 8.1*  MG 2.1  --   --   --   --   PHOS 3.3  --   --   --   --    GFR Estimated Creatinine Clearance: 79.3 mL/min (by C-G formula based on SCr of 0.7 mg/dL). Liver Function Tests:  Recent Labs Lab 08/08/16 1120 08/09/16 0139  AST 33 34  ALT 25 26  ALKPHOS 128* 126  BILITOT 0.9 0.6  PROT 6.4* 6.6  ALBUMIN 1.9* 1.9*   No results for input(s): LIPASE, AMYLASE in the last 168 hours. No results for input(s): AMMONIA in the last 168 hours. Coagulation profile  Recent Labs Lab 08/09/16 0139  INR 1.35    CBC:  Recent Labs Lab 08/06/16 0207 08/07/16 0232 08/08/16 0339 08/09/16 0139 08/10/16 0205  WBC 12.6* 13.6* 14.6* 13.8* 13.3*  HGB 9.1* 9.0* 8.8* 9.0* 9.2*  HCT 27.6* 27.4* 27.4* 27.6* 28.4*  MCV 83.1 83.8 84.0 83.9 83.3  PLT 299 320 293 358 303   Cardiac Enzymes:  Recent Labs Lab 08/04/16 1646 08/04/16 2126 08/05/16 0454 08/07/16 0747  TROPONINI 0.46* 3.31* 9.70* 4.99*   BNP: Invalid input(s): POCBNP CBG:  Recent Labs Lab 08/09/16 0607 08/09/16 1116 08/09/16 2115 08/10/16 0614 08/10/16 1140  GLUCAP 78 81 115* 107* 142*   Microbiology Recent Results (from the past 240 hour(s))  MRSA PCR Screening     Status: None   Collection Time: 08/04/16  7:17 PM  Result Value Ref Range Status   MRSA by PCR NEGATIVE NEGATIVE Final    Comment:        The GeneXpert MRSA Assay (FDA approved for NASAL specimens only), is one component of a comprehensive MRSA colonization surveillance program. It is not intended to diagnose MRSA infection nor to guide or monitor treatment for MRSA infections.      Discharge Instructions:   Discharge Instructions    Call MD for:   difficulty breathing, headache or visual disturbances    Complete by:  As directed    Call MD for:  persistant dizziness or light-headedness    Complete by:  As directed    Call MD for:  severe uncontrolled pain    Complete by:  As directed    Diet - low sodium heart healthy    Complete by:  As directed    Discharge instructions    Complete by:  As directed    Follow up with your primary care doctor to get the results of your biopsy.   Face-to-face encounter (required for Medicare/Medicaid patients)    Complete by:  As directed    I Vincentina Sollers certify that this patient is under my care and that I, or a nurse practitioner or physician's assistant working with me, had a face-to-face encounter that meets the physician face-to-face encounter requirements with this patient on 08/10/2016. The encounter with the patient was in whole, or in part for the following medical condition(s) which is the primary reason for home health care (List medical condition): Needs skilled RN to teach patient about disease, importance of follow up, make sure he follows up with PCP to obtain biopsy results and help to coordinate care given language barrier, poor health care literacy.   The encounter with the patient was in whole, or in part, for the following medical condition, which is the primary reason for home health care:  Acute pulmonary embolism, probable metastatic cancer, cachexia   I certify that, based on my findings, the following services are medically necessary home health services:  Nursing   Reason for Medically Necessary Home Health Services:   Skilled Nursing- Change/Decline in Patient Status Skilled Nursing- Changes in Medication/Medication Management Skilled Nursing- Skilled Assessment/Observation Skilled Nursing- Teaching of Disease Process/Symptom Management     My clinical findings support the need for the above services:  Shortness of breath with activity   Further, I certify that my clinical  findings support that this patient is homebound due to:  Shortness of Breath with activity   Home Health    Complete by:  As directed    To provide the following care/treatments:  RN   Increase activity slowly    Complete by:  As directed      Allergies as of 08/10/2016   No Known Allergies     Medication List    STOP taking these medications   diphenhydrAMINE 25 MG tablet Commonly known as:  BENADRYL   hydrocortisone cream 1 %   metoprolol tartrate 25 MG tablet Commonly known as:  LOPRESSOR     TAKE these medications   amLODipine 10 MG tablet Commonly known as:  NORVASC Take 1 tablet (10 mg total) by mouth daily.  apixaban 5 MG Tabs tablet Commonly known as:  ELIQUIS Take 2 tablets (10 mg total) by mouth 2 (two) times daily.   pantoprazole 40 MG tablet Commonly known as:  PROTONIX Take 1 tablet (40 mg total) by mouth daily. Start taking on:  08/11/2016      Follow-up Information    PUSCHINSKY,RICHARD W., MD Follow up.   Specialty:  General Surgery Why:  Follow up in 1 week. Contact information: [address] [place] [address] [place] Follow up.   Why:  HHRN arranged- please allow 24-48 hr for them to call to arrange home visits Contact information: [address] [place] [address] [phone]            Time coordinating discharge: 40 minutes.  Signed:  Jamisyn Langer  Pager [phone] [facility] 08/10/2016, 2:55 PM

## 2016-08-10 NOTE — Progress Notes (Signed)
ANTICOAGULATION CONSULT NOTE - Follow Up Consult  Pharmacy Consult for Heparin Indication: pulmonary embolus  No Known Allergies  Patient Measurements: Height: 5\' 10"  (177.8 cm) Weight: 161 lb (73 kg) IBW/kg (Calculated) : 73 Heparin Dosing Weight:  77.1 kg  Vital Signs: Temp: 98 F (36.7 C) (03/12 2043) Temp Source: Oral (03/12 2043) BP: 102/55 (03/12 2043) Pulse Rate: 89 (03/12 2043)  Labs:  Recent Labs  08/07/16 0232 08/07/16 0747  08/08/16 0339 08/08/16 1120 08/09/16 0139 08/10/16 0205  HGB 9.0*  --   --  8.8*  --  9.0* 9.2*  HCT 27.4*  --   --  27.4*  --  27.6* 28.4*  PLT 320  --   --  293  --  358 303  APTT  --   --   < > 88* 102* 118* 95*  LABPROT  --   --   --   --   --  16.8*  --   INR  --   --   --   --   --  1.35  --   HEPARINUNFRC 0.40  --   --  1.44*  --  0.58 0.25*  CREATININE 0.63  --   --  0.65  --  0.70  --   TROPONINI  --  4.99*  --   --   --   --   --   < > = values in this interval not displayed.  Estimated Creatinine Clearance: 84.9 mL/min (by C-G formula based on SCr of 0.7 mg/dL).   Assessment: 71 yo male with PE. Not an EKOS candidate d/t multiple liver lesions suspicious for metastatic cancer. He was changed to [place] (received one dose of 10mg  3/10 at 0900)  S/p biopsy 3/12 so bridging with heparin. Heparin level down to 0.25 (subtherapeutic) on 1550 units/hr. No issues with line or bleeding reported per RN. CBC stable.  Goal of Therapy:  Heparin level 0.3-0.7 units/ml   Monitor platelets by anticoagulation protocol: Yes   Plan:  Increase heparin to 1700 units/hr F/u 8 hr heparin level  Sherlon Handing, PharmD, BCPS Clinical pharmacist, pager [phone]  08/10/2016 3:29 AM

## 2016-08-10 NOTE — Care Management Note (Signed)
Case Management Note Previous CM note initiated by Carles Collet, RN 08/06/2016, 3:04 PM   Patient Details  Name: Jared Clements MRN: [id number] Date of Birth: Oct 08, 1945  Subjective/Objective:                 Spoke with patient and son at the bedside. Provided with Medicare B card and stated that patient has [facility]. Placed copy of Medicare and old Medicaid cards in chart. Patient on hep gtt, WATCH FOR ANTICOAG needs at DC. Patient moved from [place] 3 days ago.   Action/Plan:   Expected Discharge Date:  08/10/16               Expected Discharge Plan:  [place]  In-House Referral:     Discharge planning Services  CM Consult  Post Acute Care Choice:  Home Health Choice offered to:  Adult Children  DME Arranged:  N/A DME Agency:  NA  HH Arranged:  PT HH Agency:     Status of Service:  Completed, signed off  If discussed at [place] of Stay Meetings, dates discussed:    Additional Comments:  08/10/16- 1430- Iyla Balzarini RN, CM- pt now showing that he has [place] Medicaid- with confirmed ID #- pt has been started on Eliquis- which is a preferred drug with Medicaid- spoke with pt and son at bedside- 30 day free card given to son to use on discharge- MD has also ordered [facility]- discussed with son and choice offered for [facility] agency- per son they would like to use [facility] for services- referral called to Santiago Glad with [facility]- who accepted referral. Pt did not meet home 02 requirements and will not need home 02.   Dahlia Client [place], RN 08/10/2016, 2:32 PM [phone]

## 2016-08-10 NOTE — Progress Notes (Signed)
08/10/2016 12:56 PM SATURATION QUALIFICATIONS: (This note is used to comply with regulatory documentation for home oxygen)  Patient Saturations on Room Air at Rest = 94%  Patient Saturations on Room Air while Ambulating =92%  Patient Saturations on 3 Liters of oxygen while Ambulating = 100%  Please briefly explain why patient needs home oxygen:Pt does not qualify

## 2016-08-15 ENCOUNTER — Encounter (HOSPITAL_COMMUNITY): Payer: Self-pay

## 2016-08-15 ENCOUNTER — Emergency Department (HOSPITAL_COMMUNITY): Payer: Medicare Other

## 2016-08-15 ENCOUNTER — Inpatient Hospital Stay (HOSPITAL_COMMUNITY)
Admission: EM | Admit: 2016-08-15 | Discharge: 2016-08-21 | DRG: 189 | Disposition: A | Discharge status: Hospice | Payer: Medicare Other | Attending: Internal Medicine | Admitting: Internal Medicine

## 2016-08-15 DIAGNOSIS — D6869 Other thrombophilia: Secondary | ICD-10-CM | POA: Diagnosis not present

## 2016-08-15 DIAGNOSIS — Z681 Body mass index (BMI) 19 or less, adult: Secondary | ICD-10-CM

## 2016-08-15 DIAGNOSIS — R414 Neurologic neglect syndrome: Secondary | ICD-10-CM | POA: Diagnosis present

## 2016-08-15 DIAGNOSIS — E876 Hypokalemia: Secondary | ICD-10-CM | POA: Diagnosis not present

## 2016-08-15 DIAGNOSIS — R7989 Other specified abnormal findings of blood chemistry: Secondary | ICD-10-CM | POA: Diagnosis present

## 2016-08-15 DIAGNOSIS — G936 Cerebral edema: Secondary | ICD-10-CM | POA: Diagnosis not present

## 2016-08-15 DIAGNOSIS — R0600 Dyspnea, unspecified: Secondary | ICD-10-CM | POA: Diagnosis present

## 2016-08-15 DIAGNOSIS — D72829 Elevated white blood cell count, unspecified: Secondary | ICD-10-CM | POA: Diagnosis present

## 2016-08-15 DIAGNOSIS — Z86711 Personal history of pulmonary embolism: Secondary | ICD-10-CM

## 2016-08-15 DIAGNOSIS — I6521 Occlusion and stenosis of right carotid artery: Secondary | ICD-10-CM | POA: Diagnosis present

## 2016-08-15 DIAGNOSIS — R64 Cachexia: Secondary | ICD-10-CM | POA: Diagnosis present

## 2016-08-15 DIAGNOSIS — I2489 Other forms of acute ischemic heart disease: Secondary | ICD-10-CM | POA: Diagnosis present

## 2016-08-15 DIAGNOSIS — D638 Anemia in other chronic diseases classified elsewhere: Secondary | ICD-10-CM | POA: Diagnosis present

## 2016-08-15 DIAGNOSIS — I10 Essential (primary) hypertension: Secondary | ICD-10-CM | POA: Diagnosis not present

## 2016-08-15 DIAGNOSIS — R0902 Hypoxemia: Secondary | ICD-10-CM

## 2016-08-15 DIAGNOSIS — I63421 Cerebral infarction due to embolism of right anterior cerebral artery: Secondary | ICD-10-CM | POA: Diagnosis present

## 2016-08-15 DIAGNOSIS — Z7189 Other specified counseling: Secondary | ICD-10-CM

## 2016-08-15 DIAGNOSIS — E785 Hyperlipidemia, unspecified: Secondary | ICD-10-CM | POA: Diagnosis present

## 2016-08-15 DIAGNOSIS — C259 Malignant neoplasm of pancreas, unspecified: Secondary | ICD-10-CM | POA: Diagnosis not present

## 2016-08-15 DIAGNOSIS — R748 Abnormal levels of other serum enzymes: Secondary | ICD-10-CM

## 2016-08-15 DIAGNOSIS — Z8546 Personal history of malignant neoplasm of prostate: Secondary | ICD-10-CM

## 2016-08-15 DIAGNOSIS — I2699 Other pulmonary embolism without acute cor pulmonale: Secondary | ICD-10-CM

## 2016-08-15 DIAGNOSIS — C787 Secondary malignant neoplasm of liver and intrahepatic bile duct: Secondary | ICD-10-CM | POA: Diagnosis not present

## 2016-08-15 DIAGNOSIS — Z515 Encounter for palliative care: Secondary | ICD-10-CM | POA: Diagnosis not present

## 2016-08-15 DIAGNOSIS — R000 Tachycardia, unspecified: Secondary | ICD-10-CM | POA: Diagnosis present

## 2016-08-15 DIAGNOSIS — J9601 Acute respiratory failure with hypoxia: Principal | ICD-10-CM | POA: Diagnosis present

## 2016-08-15 DIAGNOSIS — G8194 Hemiplegia, unspecified affecting left nondominant side: Secondary | ICD-10-CM

## 2016-08-15 DIAGNOSIS — I63231 Cerebral infarction due to unspecified occlusion or stenosis of right carotid arteries: Secondary | ICD-10-CM

## 2016-08-15 DIAGNOSIS — R200 Anesthesia of skin: Secondary | ICD-10-CM

## 2016-08-15 DIAGNOSIS — R449 Unspecified symptoms and signs involving general sensations and perceptions: Secondary | ICD-10-CM | POA: Diagnosis present

## 2016-08-15 DIAGNOSIS — R6510 Systemic inflammatory response syndrome (SIRS) of non-infectious origin without acute organ dysfunction: Secondary | ICD-10-CM | POA: Diagnosis present

## 2016-08-15 DIAGNOSIS — I248 Other forms of acute ischemic heart disease: Secondary | ICD-10-CM | POA: Diagnosis not present

## 2016-08-15 DIAGNOSIS — E7439 Other disorders of intestinal carbohydrate absorption: Secondary | ICD-10-CM | POA: Diagnosis present

## 2016-08-15 DIAGNOSIS — R778 Other specified abnormalities of plasma proteins: Secondary | ICD-10-CM | POA: Diagnosis present

## 2016-08-15 DIAGNOSIS — K869 Disease of pancreas, unspecified: Secondary | ICD-10-CM | POA: Diagnosis not present

## 2016-08-15 DIAGNOSIS — E871 Hypo-osmolality and hyponatremia: Secondary | ICD-10-CM | POA: Diagnosis not present

## 2016-08-15 DIAGNOSIS — I2609 Other pulmonary embolism with acute cor pulmonale: Secondary | ICD-10-CM | POA: Diagnosis present

## 2016-08-15 DIAGNOSIS — G9340 Encephalopathy, unspecified: Secondary | ICD-10-CM

## 2016-08-15 DIAGNOSIS — H53462 Homonymous bilateral field defects, left side: Secondary | ICD-10-CM

## 2016-08-15 DIAGNOSIS — Z7901 Long term (current) use of anticoagulants: Secondary | ICD-10-CM

## 2016-08-15 DIAGNOSIS — I639 Cerebral infarction, unspecified: Secondary | ICD-10-CM

## 2016-08-15 DIAGNOSIS — K8689 Other specified diseases of pancreas: Secondary | ICD-10-CM | POA: Diagnosis present

## 2016-08-15 DIAGNOSIS — E878 Other disorders of electrolyte and fluid balance, not elsewhere classified: Secondary | ICD-10-CM | POA: Diagnosis not present

## 2016-08-15 DIAGNOSIS — G9349 Other encephalopathy: Secondary | ICD-10-CM | POA: Diagnosis not present

## 2016-08-15 DIAGNOSIS — Z79899 Other long term (current) drug therapy: Secondary | ICD-10-CM

## 2016-08-15 LAB — CBC
HCT: 32.4 % — ABNORMAL LOW (ref 39.0–52.0)
HEMOGLOBIN: 10.7 g/dL — AB (ref 13.0–17.0)
MCH: 27.6 pg (ref 26.0–34.0)
MCHC: 33 g/dL (ref 30.0–36.0)
MCV: 83.5 fL (ref 78.0–100.0)
Platelets: 203 10*3/uL (ref 150–400)
RBC: 3.88 MIL/uL — ABNORMAL LOW (ref 4.22–5.81)
RDW: 15.9 % — ABNORMAL HIGH (ref 11.5–15.5)
WBC: 19.9 10*3/uL — ABNORMAL HIGH (ref 4.0–10.5)

## 2016-08-15 LAB — I-STAT TROPONIN, ED: TROPONIN I, POC: 0.33 ng/mL — AB (ref 0.00–0.08)

## 2016-08-15 LAB — BASIC METABOLIC PANEL
ANION GAP: 15 (ref 5–15)
BUN: 9 mg/dL (ref 6–20)
CALCIUM: 8.4 mg/dL — AB (ref 8.9–10.3)
CO2: 20 mmol/L — ABNORMAL LOW (ref 22–32)
CREATININE: 0.84 mg/dL (ref 0.61–1.24)
Chloride: 98 mmol/L — ABNORMAL LOW (ref 101–111)
GFR calc non Af Amer: >60 mL/min (ref >60)
GLUCOSE: 108 mg/dL — AB (ref 65–99)
POTASSIUM: 3.6 mmol/L (ref 3.5–5.1)
Sodium: 133 mmol/L — ABNORMAL LOW (ref 135–145)

## 2016-08-15 MED ORDER — LEVOFLOXACIN IN D5W 500 MG/100ML IV SOLN
500.0000 mg | INTRAVENOUS | Status: DC
  Administered 2016-08-16: 500 mg via INTRAVENOUS
  Filled 2016-08-15: qty 100

## 2016-08-15 MED ORDER — IPRATROPIUM-ALBUTEROL 0.5-2.5 (3) MG/3ML IN SOLN
3.0000 mL | RESPIRATORY_TRACT | Status: DC | PRN
  Administered 2016-08-17: 3 mL via RESPIRATORY_TRACT
  Filled 2016-08-15: qty 3

## 2016-08-15 MED ORDER — ACETAMINOPHEN 325 MG PO TABS: 650.0000 mg | ORAL_TABLET | Freq: Four times a day (QID) | ORAL | Status: DC | PRN

## 2016-08-15 MED ORDER — ONDANSETRON HCL 4 MG PO TABS
4.0000 mg | ORAL_TABLET | Freq: Four times a day (QID) | ORAL | Status: DC | PRN
Start: 2016-08-15 — End: 2016-08-17

## 2016-08-15 MED ORDER — PANTOPRAZOLE SODIUM 40 MG PO TBEC
40.0000 mg | DELAYED_RELEASE_TABLET | Freq: Every day | ORAL | Status: DC
  Filled 2016-08-15: qty 1

## 2016-08-15 MED ORDER — AMLODIPINE BESYLATE 10 MG PO TABS
10.0000 mg | ORAL_TABLET | Freq: Every day | ORAL | Status: DC
  Filled 2016-08-15: qty 1

## 2016-08-15 MED ORDER — IOPAMIDOL (ISOVUE-370) INJECTION 76%
INTRAVENOUS | Status: AC
  Administered 2016-08-15: 100 mL
  Filled 2016-08-15: qty 100

## 2016-08-15 MED ORDER — SODIUM CHLORIDE 0.9% FLUSH
3.0000 mL | Freq: Two times a day (BID) | INTRAVENOUS | Status: DC
  Administered 2016-08-16 – 2016-08-21 (×5): 3 mL via INTRAVENOUS

## 2016-08-15 MED ORDER — APIXABAN 5 MG PO TABS
10.0000 mg | ORAL_TABLET | Freq: Two times a day (BID) | ORAL | Status: DC
  Administered 2016-08-16: 10 mg via ORAL
  Filled 2016-08-15 (×3): qty 2

## 2016-08-15 MED ORDER — SODIUM CHLORIDE 0.9 % IV SOLN
INTRAVENOUS | Status: AC
  Administered 2016-08-16: 60 mL/h via INTRAVENOUS

## 2016-08-15 MED ORDER — POLYETHYLENE GLYCOL 3350 17 G PO PACK: 17.0000 g | PACK | Freq: Every day | ORAL | Status: DC | PRN

## 2016-08-15 MED ORDER — ACETAMINOPHEN 650 MG RE SUPP
650.0000 mg | Freq: Four times a day (QID) | RECTAL | Status: DC | PRN
  Administered 2016-08-16: 650 mg via RECTAL
  Filled 2016-08-15: qty 1

## 2016-08-15 MED ORDER — ONDANSETRON HCL 4 MG/2ML IJ SOLN: 4.0000 mg | Freq: Four times a day (QID) | INTRAMUSCULAR | Status: DC | PRN

## 2016-08-15 MED ORDER — HYDROCODONE-ACETAMINOPHEN 5-325 MG PO TABS
1.0000 | ORAL_TABLET | ORAL | Status: DC | PRN
Start: 2016-08-15 — End: 2016-08-16

## 2016-08-15 NOTE — ED Notes (Signed)
Patient transported to CT 

## 2016-08-15 NOTE — ED Notes (Signed)
I Stat Trop results shown to [facility]

## 2016-08-15 NOTE — ED Notes (Signed)
This RN attempted to get IV access and was unsuccessful. IV team consulted.

## 2016-08-15 NOTE — ED Triage Notes (Signed)
Pt complaining of cental non radiating chest pain. Pt states recently admitted to hospital for same. Pt states coughing up yellow sputum. Pt also complaining of some SOB.

## 2016-08-15 NOTE — ED Notes (Signed)
Phlebotomy at bedside to draw blood.  

## 2016-08-15 NOTE — ED Notes (Signed)
Pt intial SpO2 88% on room air. Pt placed on 2L with no change. Pt placed on 3L, SpO2 at 95%.

## 2016-08-15 NOTE — ED Notes (Signed)
Sent add on labels to main lab.

## 2016-08-15 NOTE — ED Notes (Signed)
CT called about patient having appropriate IV for PE study.  They will send for him when they have a scanner available.

## 2016-08-15 NOTE — H&P (Signed)
History and Physical    Kable Tennis XQJ:[id number] DOB: 04/11/1946 DOA: 08/15/2016  PCP: Harvie Junior, MD   Patient coming from: Home  Chief Complaint: Low O2 sat, dyspnea, productive cough   HPI: Jared Clements is a 71 y.o. male with medical history significant for prostate cancer status post resection, pancreatic and liver masses status post recent biopsy, and recent diagnosis of acute saddle PE with heart strain, discharge from the hospital on 08/10/2016 with Eliquis and with follow-up with surgery regarding the biopsy. Patient reports that he is continued to have a cough and dyspnea at home and describes his chest pain as slowly improving. He has noted his cough to have become a little worse and now productive of thick yellow sputum. He has been dyspneic with minimal exertion, but feels that this is fairly stable from time of recent discharge. He denies fevers or chills and denies leg swelling or orthopnea. He reports continued adherence with the anticoagulant. Home health nurse visited him this afternoon, noted him to be hypoxic, and directed him to the emergency department for further evaluation.  ED Course: Upon arrival to the ED, patient is found to be afebrile, saturating 88% on room air while at rest, mildly tachypneic, mildly tachycardic, hypertensive to 180/80. EKG features a sinus tachycardia with rate 112 and anterolateral T-wave inversion. Chest x-ray is negative for acute cardiopulmonary disease. Mr. Jared Clements features a mild hyponatremia and hypochloremia and CBC is notable for a leukocytosis to 19,900, up from 13,300 a few days ago. CBC also features a normocytic anemia with hemoglobin 10.7, improved from the 9 range earlier this month. Troponin is elevated to a value of 0.33, down from 4.99 on 08/07/2016 after having peaked at 9.7 on 08/05/2016. Patient has required 3 L/m supplemental oxygen in the emergency department in order to maintain adequate oxygenation while at rest in bed. CTA  was repeated in the emergency department and notable for persistent bilateral pulmonary emboli, but improved since the prior study. There continues to be an elevated RV to LV ratio, but it has improved from 3.2 at time of initial diagnosis to 1.4 today. Pancreatic mass is again noted on the CT. Patient will be admitted to the telemetry unit for ongoing evaluation and management of progressive dyspnea and worsening cough in the setting of recent massive PE, now productive of thick yellow sputum, with hypoxia.  Review of Systems:  All other systems reviewed and apart from HPI, are negative.  Past Medical History:  Diagnosis Date  . Acute pancreatitis 10/24/2014  . Acute saddle pulmonary embolism with acute cor pulmonale ([place]) 08/04/2016  . Aortic calcification ([place]) 08/06/2016  . Cancer ([place])    prostates ca  . Hypertension   . Impaired fasting glucose 08/06/2016   Hemoglobin A1c 6.2% March, 2018.  Marland Kitchen Pyelonephritis 09/15/2012  . Solitary pulmonary nodule 08/06/2016    Past Surgical History:  Procedure Laterality Date  . PROSTATE SURGERY       reports that he has never smoked. He has never used smokeless tobacco. He reports that he does not drink alcohol or use drugs.  No Known Allergies  Family History  Problem Relation Age of Onset  . Other Mother   . Other Father      Prior to Admission medications   Medication Sig Start Date End Date Taking? Authorizing Provider  amLODipine (NORVASC) 10 MG tablet Take 1 tablet (10 mg total) by mouth daily. 08/10/16  Yes Venetia Maxon Rama, MD  apixaban (ELIQUIS) 5 MG TABS  tablet Take 2 tablets (10 mg total) by mouth 2 (two) times daily. Patient taking differently: Take 5-10 mg by mouth See admin instructions. "10 mg two times a day for one week then 5 mg two times a day after one week" 08/10/16  Yes Venetia Maxon Rama, MD  pantoprazole (PROTONIX) 40 MG tablet Take 1 tablet (40 mg total) by mouth daily. 08/11/16  Yes Venetia Maxon Rama, MD    Physical  Exam: Vitals:   08/15/16 2045 08/15/16 2100 08/15/16 2130 08/15/16 2200  BP: (!) 155/84 (!) 141/80 (!) 147/82 129/71  Pulse: (!) 107 (!) 102 (!) 108 (!) 104  Resp: (!) 21 (!) 32 (!) 27 (!) 35  Temp:      TempSrc:      SpO2: 95% 100% 100% 97%  Weight:      Height:          Constitutional: cachectic, mild tachypnea, calm, comfortable Eyes: PERTLA, lids and conjunctivae normal ENMT: Mucous membranes are moist. Posterior pharynx clear of any exudate or lesions.   Neck: normal, supple, no masses, no thyromegaly Respiratory: Coarse rhonchi at left base. No wheezes. No pallor or cyanosis. Mild tachypnea. Nasal canula in place delivering 4 Lpm.  Cardiovascular: Rate ~110 and regular. No extremity edema. JVP 9 cm H2O. Abdomen: No distension, no tenderness, no masses palpated. Bowel sounds normal.  Musculoskeletal: no clubbing / cyanosis. No joint deformity upper and lower extremities.  Skin: no significant rashes, lesions, ulcers. Warm, dry, well-perfused. Neurologic: CN 2-12 grossly intact. Sensation intact, DTR normal. Strength 5/5 in all 4 limbs.  Psychiatric: Normal judgment and insight. Alert and oriented x 3. Normal mood and affect.     Labs on Admission: I have personally reviewed following labs and imaging studies  CBC:  Recent Labs Lab 08/09/16 0139 08/10/16 0205 08/15/16 2010  WBC 13.8* 13.3* 19.9*  HGB 9.0* 9.2* 10.7*  HCT 27.6* 28.4* 32.4*  MCV 83.9 83.3 83.5  PLT 358 303 431   Basic Metabolic Panel:  Recent Labs Lab 08/09/16 0139 08/15/16 2010  NA 134* 133*  K 4.0 3.6  CL 104 98*  CO2 23 20*  GLUCOSE 91 108*  BUN 9 9  CREATININE 0.70 0.84  CALCIUM 8.1* 8.4*   GFR: Estimated Creatinine Clearance: 80.4 mL/min (by C-G formula based on SCr of 0.84 mg/dL). Liver Function Tests:  Recent Labs Lab 08/09/16 0139  AST 34  ALT 26  ALKPHOS 126  BILITOT 0.6  PROT 6.6  ALBUMIN 1.9*   No results for input(s): LIPASE, AMYLASE in the last 168 hours. No  results for input(s): AMMONIA in the last 168 hours. Coagulation Profile:  Recent Labs Lab 08/09/16 0139  INR 1.35   Cardiac Enzymes: No results for input(s): CKTOTAL, CKMB, CKMBINDEX, TROPONINI in the last 168 hours. BNP (last 3 results) No results for input(s): PROBNP in the last 8760 hours. HbA1C: No results for input(s): HGBA1C in the last 72 hours. CBG:  Recent Labs Lab 08/09/16 0607 08/09/16 1116 08/09/16 2115 08/10/16 0614 08/10/16 1140  GLUCAP 78 81 115* 107* 142*   Lipid Profile: No results for input(s): CHOL, HDL, LDLCALC, TRIG, CHOLHDL, LDLDIRECT in the last 72 hours. Thyroid Function Tests: No results for input(s): TSH, T4TOTAL, FREET4, T3FREE, THYROIDAB in the last 72 hours. Anemia Panel: No results for input(s): VITAMINB12, FOLATE, FERRITIN, TIBC, IRON, RETICCTPCT in the last 72 hours. Urine analysis:    Component Value Date/Time   COLORURINE YELLOW 10/24/2014 0003   APPEARANCEUR TURBID (A) 10/24/2014 0003  LABSPEC 1.027 10/24/2014 0003   PHURINE 5.5 10/24/2014 0003   GLUCOSEU 100 (A) 10/24/2014 0003   HGBUR TRACE (A) 10/24/2014 0003   BILIRUBINUR NEGATIVE 10/24/2014 0003   KETONESUR NEGATIVE 10/24/2014 0003   PROTEINUR 30 (A) 10/24/2014 0003   UROBILINOGEN 1.0 10/24/2014 0003   NITRITE NEGATIVE 10/24/2014 0003   LEUKOCYTESUR NEGATIVE 10/24/2014 0003   Sepsis Labs: @LABRCNTIP (procalcitonin:4,lacticidven:4) )No results found for this or any previous visit (from the past 240 hour(s)).   Radiological Exams on Admission: Ct Angio Chest Pe W And/or Wo Contrast  Result Date: 08/15/2016 CLINICAL DATA:  Central non radiating chest pain. Shortness of breath. Recent admission to hospital for same. Productive cough. EXAM: CT ANGIOGRAPHY CHEST WITH CONTRAST TECHNIQUE: Multidetector CT imaging of the chest was performed using the standard protocol during bolus administration of intravenous contrast. Multiplanar CT image reconstructions and MIPs were obtained to  evaluate the vascular anatomy. CONTRAST:  100 mL Isovue 370 COMPARISON:  08/04/2016 FINDINGS: Cardiovascular: Persistent pulmonary emboli are demonstrated in the right upper lung, right lower lung, and left lower lung segmental and subsegmental pulmonary arteries. There is mild improvement since the previous study with partial interval recanalization demonstrated vertically in the left. There is no definite evidence of any progression. The RV to LV ratio remains elevated at 1.4, improved since previous study. This could indicate persistence of right heart strain. Mild cardiac enlargement. Small pericardial effusion. Normal caliber thoracic aorta with scattered calcifications. Mediastinum/Nodes: Scattered lymph nodes are not pathologically enlarged. Esophagus is decompressed. Thyroid gland is unremarkable. Lungs/Pleura: Evaluation is limited due to motion artifact. There is atelectasis in the lung bases. No discrete consolidation. No pleural effusions. No pneumothorax. Airways are grossly clear. Upper Abdomen: Multiple liver lesions are demonstrated, largest measuring about 4 cm diameter in the lateral segment left lobe. These were better seen on previous contrast-enhanced CT abdomen and pelvis from 08/06/2016 and likely represent metastatic disease. The pancreatic lesion and pancreatic ductal dilatation seen on previous CT are visualized but less well defined without contrast. Changes are suspicious for pancreatic carcinoma and liver metastasis. There is a left adrenal gland nodule measuring about 12 mm in diameter. Musculoskeletal: No chest wall abnormality. No acute or significant osseous findings. Review of the MIP images confirms the above findings. IMPRESSION: Persistent bilateral pulmonary emboli, demonstrating improvement since the previous study. This suggests response to interval therapy. No progression of pulmonary emboli. RV to LV ratio remains elevated at 1.4 although improved since previous study. This  could indicate persistence of right heart strain. Mass lesion in the body of the pancreas with pancreatic ductal dilatation and multiple hepatic metastases are again demonstrated but less well defined than on the previous contrast-enhanced CT abdomen and pelvis. Lung nodules seen previously are not well defined on today's study due to motion artifact and atelectasis. Electronically Signed   By: Lucienne Capers M.D.   On: 08/15/2016 22:40   Dg Chest Port 1 View  Result Date: 08/15/2016 CLINICAL DATA:  Central non radiating chest pain. Shortness of breath. Patient was diagnosed with pulmonary embolus on 08/04/2016. EXAM: PORTABLE CHEST 1 VIEW COMPARISON:  CT of the chest 08/04/2016 FINDINGS: The cardiac silhouette is mildly enlarged. There is no evidence of focal airspace consolidation, pleural effusion or pneumothorax. Low lung volumes. Osseous structures are without acute abnormality. Soft tissues are grossly normal. IMPRESSION: No radiographic evidence of acute cardiopulmonary disease. Electronically Signed   By: Fidela Salisbury M.D.   On: 08/15/2016 20:30    EKG: Independently reviewed. Sinus tachycardia (  rate 112), anterolateral T-wave inversions; improved from prior.  Assessment/Plan  1. Acute hypoxic respiratory failure  - Discharged from hospital 08/10/16 after acute management of b/l PE with heart strain  - He has been adherent with Eliquis and CTA, repeated in ED, demonstrates improvement in PE and RV-to-LV ratio - Pt did not require oxygen at time of hospital discharge, does not feel much more dyspneic now, but has worse cough with new production of thick yellow sputum - No fever, but leukocytosis worsening; plan to check influenza PCR, sputum culture, start empiric Levaquin, continue supplemental O2, trial nebs    2. PE with heart strain  - CTA demonstrates improvement in both clot burden and RV-to-LV ratio - Continue Eliquis    3. Elevated troponin  - Troponin elevated to 0.33 on  admission, down from 4.99 on 08/07/16, and down from a peak of 9.70 on 08/05/16  - Pt reports his pleuritic chest pain to be continually improving; EKG appears improved from recent admit  - Likely trending down from time of acute PE with cor pulmonale; plan to monitor on telemetry and check another troponin overnight to see trend   - Continue Eliquis   4. 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 Hypertension  - BP modestly elevated on arrival  - Continue Norvasc    6. Normocytic anemia  - Hgb is 10.2 on admission, stable relative to priors and with no bleeding evident  - Continue to monitor with periodic CBC    DVT prophylaxis: Eliquis  Code Status: Full  Family Communication: Sons updated at bedside  Disposition Plan: Admit to telemetry Consults called: None Admission status: Inpatient    Vianne Bulls, MD [facility] Pager [phone]  If 7PM-7AM, please contact night-coverage [email or web address] Password [facility]  08/15/2016, 11:47 PM

## 2016-08-15 NOTE — ED Provider Notes (Addendum)
[facility] DEPT Provider Note   CSN: [id number] Arrival date & time: 08/15/16  1931     History   Chief Complaint Chief Complaint  Patient presents with  . Chest Pain  . Cough  . Shortness of Breath    HPI Jared Clements is a 71 y.o. male.  HPI Patient recently admitted to the hospital with bilateral PE and right heart strain. Started on anticoagulants. Discharged 5 days ago. Has had increased shortness of breath, cough. Denies fever or chills. Denies chest pain. Has been compliant with medication. Past Medical History:  Diagnosis Date  . Acute pancreatitis 10/24/2014  . Acute saddle pulmonary embolism with acute cor pulmonale ([place]) 08/04/2016  . Aortic calcification ([place]) 08/06/2016  . Cancer ([place])    prostates ca  . Hypertension   . Impaired fasting glucose 08/06/2016   Hemoglobin A1c 6.2% March, 2018.  Marland Kitchen Pyelonephritis 09/15/2012  . Solitary pulmonary nodule 08/06/2016    Patient Active Problem List   Diagnosis Date Noted  . Acute encephalopathy   . Pancreatic adenocarcinoma ([place])   . Goals of care, counseling/discussion   . Palliative care by specialist   . 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 Acute left hemiparesis ([place])   . Hemisensory loss   . Hemianopia, homonymous, left   . Pancreatic mass 08/15/2016  . Liver metastases ([place]) 08/15/2016  . Elevated CEA 08/09/2016  . Metastasis ([place]) 08/07/2016  . History of prostate cancer 08/06/2016  . Aortic calcification ([place]) 08/06/2016  . Solitary pulmonary nodule 08/06/2016  . Acute respiratory failure with hypoxia ([place]) 08/06/2016  . Impaired fasting glucose 08/06/2016  . Prediabetes 08/06/2016  . Elevated troponin 08/06/2016  . Demand ischemia ([place]) 08/06/2016  . Malnutrition of moderate degree 08/06/2016  . Anemia of chronic disease 08/06/2016  . Subacute pulmonary embolism with acute cor pulmonale ([place]) 08/04/2016  . Dyspnea   . HLD (hyperlipidemia) 10/24/2014  . Essential  hypertension   . Hyponatremia 09/15/2012  . Hypokalemia 09/15/2012    Past Surgical History:  Procedure Laterality Date  . PROSTATE SURGERY         Home Medications    Prior to Admission medications   Medication Sig Start Date End Date Taking? Authorizing Provider  enoxaparin (LOVENOX) 100 MG/ML injection Inject 1 mL (100 mg total) into the skin daily. 08/22/16   Modena Jansky, MD  Morphine Sulfate (MORPHINE CONCENTRATE) 10 mg / 0.5 ml concentrated solution Place 0.25 mLs (5 mg total) under the tongue every 6 (six) hours as needed for moderate pain, severe pain, anxiety or shortness of breath. 08/21/16   Modena Jansky, MD    Family History Family History  Problem Relation Age of Onset  . Other Mother   . Other Father     Social History Social History  Substance Use Topics  . Smoking status: Never Smoker  . Smokeless tobacco: Never Used  . Alcohol use No     Allergies   Patient has no known allergies.   Review of Systems Review of Systems  Constitutional: Positive for fatigue. Negative for chills and fever.  Respiratory: Positive for cough and shortness of breath.   Cardiovascular: Negative for chest pain, palpitations and leg swelling.  Gastrointestinal: Negative for abdominal pain, nausea and vomiting.  Neurological: Negative for weakness, numbness and headaches.  All other systems reviewed and are negative.    Physical Exam Updated Vital Signs BP (!) 149/86 (BP Location: Right Arm)   Pulse (!) 105  Temp 98.2 F (36.8 C) (Axillary)   Resp (!) 22   Ht 6\' 2"  (1.88 m)   Wt 154 lb 8 oz (70.1 kg)   SpO2 99%   BMI 19.84 kg/m   Physical Exam  Constitutional: He is oriented to person, place, and time. He appears well-developed and well-nourished.  Appears malnourished  HENT:  Head: Normocephalic and atraumatic.  Mouth/Throat: Oropharynx is clear and moist.  Eyes: EOM are normal. Pupils are equal, round, and reactive to light.  Neck: Normal range  of motion. Neck supple.  Cardiovascular: Regular rhythm.   Tachycardia  Pulmonary/Chest: He has rales.  Rhonchi in bilateral bases. Increased respiratory effort.  Abdominal: Soft. Bowel sounds are normal. There is no tenderness. There is no rebound and no guarding.  Musculoskeletal: Normal range of motion. He exhibits edema. He exhibits no tenderness.  1+ bilateral lower extremity edema. Distal pulses intact. No lower extremity asymmetry.  Neurological: He is alert and oriented to person, place, and time.  Moving all extremities without deficit. Sensation fully intact.  Skin: Skin is warm and dry. Capillary refill takes less than 2 seconds. No rash noted. He is not diaphoretic. No erythema.  Psychiatric: He has a normal mood and affect. His behavior is normal.  Nursing note and vitals reviewed.    ED Treatments / Results  Labs (all labs ordered are listed, but only abnormal results are displayed) Labs Reviewed  BASIC METABOLIC PANEL - Abnormal; Notable for the following:       Result Value   Sodium 133 (*)    Chloride 98 (*)    CO2 20 (*)    Glucose, Bld 108 (*)    Calcium 8.4 (*)    All other components within normal limits  CBC - Abnormal; Notable for the following:    WBC 19.9 (*)    RBC 3.88 (*)    Hemoglobin 10.7 (*)    HCT 32.4 (*)    RDW 15.9 (*)    All other components within normal limits  HEPATIC FUNCTION PANEL - Abnormal; Notable for the following:    Albumin 1.9 (*)    AST 59 (*)    Alkaline Phosphatase 133 (*)    All other components within normal limits  LIPASE, BLOOD - Abnormal; Notable for the following:    Lipase 117 (*)    All other components within normal limits  CBC WITH DIFFERENTIAL/PLATELET - Abnormal; Notable for the following:    WBC 17.1 (*)    RBC 3.46 (*)    Hemoglobin 9.3 (*)    HCT 28.8 (*)    RDW 15.7 (*)    Neutro Abs 12.9 (*)    Monocytes Absolute 1.9 (*)    Eosinophils Absolute 1.1 (*)    All other components within normal limits    PROTIME-INR - Abnormal; Notable for the following:    Prothrombin Time 23.7 (*)    All other components within normal limits  TROPONIN I - Abnormal; Notable for the following:    Troponin I 0.89 (*)    All other components within normal limits  BLOOD GAS, ARTERIAL - Abnormal; Notable for the following:    pO2, Arterial 70.5 (*)    All other components within normal limits  COMPREHENSIVE METABOLIC PANEL - Abnormal; Notable for the following:    Sodium 133 (*)    CO2 20 (*)    Glucose, Bld 132 (*)    Calcium 8.3 (*)    Albumin 2.1 (*)  AST 55 (*)    Alkaline Phosphatase 136 (*)    Total Bilirubin 1.3 (*)    All other components within normal limits  GLUCOSE, CAPILLARY - Abnormal; Notable for the following:    Glucose-Capillary 128 (*)    All other components within normal limits  CBC - Abnormal; Notable for the following:    WBC 18.5 (*)    RBC 3.64 (*)    Hemoglobin 9.8 (*)    HCT 30.3 (*)    RDW 15.9 (*)    All other components within normal limits  TROPONIN I - Abnormal; Notable for the following:    Troponin I 0.71 (*)    All other components within normal limits  URINALYSIS, ROUTINE W REFLEX MICROSCOPIC - Abnormal; Notable for the following:    Hgb urine dipstick MODERATE (*)    Ketones, ur 5 (*)    Protein, ur 30 (*)    Bacteria, UA RARE (*)    All other components within normal limits  BASIC METABOLIC PANEL - Abnormal; Notable for the following:    Sodium 133 (*)    Potassium 3.4 (*)    Glucose, Bld 108 (*)    Calcium 8.3 (*)    All other components within normal limits  APTT - Abnormal; Notable for the following:    aPTT 97 (*)    All other components within normal limits  APTT - Abnormal; Notable for the following:    aPTT 37 (*)    All other components within normal limits  HEPARIN LEVEL (UNFRACTIONATED) - Abnormal; Notable for the following:    Heparin Unfractionated >2.20 (*)    All other components within normal limits  CBC - Abnormal; Notable for  the following:    WBC 18.7 (*)    RBC 3.51 (*)    Hemoglobin 9.5 (*)    HCT 29.1 (*)    RDW 15.9 (*)    All other components within normal limits  HEPARIN LEVEL (UNFRACTIONATED) - Abnormal; Notable for the following:    Heparin Unfractionated 1.52 (*)    All other components within normal limits  GLUCOSE, CAPILLARY - Abnormal; Notable for the following:    Glucose-Capillary 108 (*)    All other components within normal limits  APTT - Abnormal; Notable for the following:    aPTT 121 (*)    All other components within normal limits  APTT - Abnormal; Notable for the following:    aPTT 97 (*)    All other components within normal limits  APTT - Abnormal; Notable for the following:    aPTT 66 (*)    All other components within normal limits  HEPARIN LEVEL (UNFRACTIONATED) - Abnormal; Notable for the following:    Heparin Unfractionated 0.80 (*)    All other components within normal limits  CBC - Abnormal; Notable for the following:    WBC 16.9 (*)    RBC 3.44 (*)    Hemoglobin 9.1 (*)    HCT 28.8 (*)    RDW 15.9 (*)    All other components within normal limits  BASIC METABOLIC PANEL - Abnormal; Notable for the following:    Sodium 134 (*)    Glucose, Bld 133 (*)    Creatinine, Ser 0.59 (*)    Calcium 8.1 (*)    All other components within normal limits  APTT - Abnormal; Notable for the following:    aPTT 80 (*)    All other components within normal limits  GLUCOSE, CAPILLARY -  Abnormal; Notable for the following:    Glucose-Capillary 145 (*)    All other components within normal limits  CBC - Abnormal; Notable for the following:    WBC 17.2 (*)    RBC 3.67 (*)    Hemoglobin 9.6 (*)    HCT 30.6 (*)    RDW 15.6 (*)    All other components within normal limits  APTT - Abnormal; Notable for the following:    aPTT 80 (*)    All other components within normal limits  CBC - Abnormal; Notable for the following:    WBC 17.0 (*)    RBC 3.69 (*)    Hemoglobin 9.7 (*)    HCT  30.5 (*)    All other components within normal limits  APTT - Abnormal; Notable for the following:    aPTT 73 (*)    All other components within normal limits  GLUCOSE, CAPILLARY - Abnormal; Notable for the following:    Glucose-Capillary 111 (*)    All other components within normal limits  CBC - Abnormal; Notable for the following:    WBC 16.7 (*)    RBC 3.70 (*)    Hemoglobin 9.9 (*)    HCT 31.1 (*)    RDW 15.7 (*)    All other components within normal limits  HEPARIN LEVEL (UNFRACTIONATED) - Abnormal; Notable for the following:    Heparin Unfractionated 0.29 (*)    All other components within normal limits  APTT - Abnormal; Notable for the following:    aPTT 65 (*)    All other components within normal limits  GLUCOSE, CAPILLARY - Abnormal; Notable for the following:    Glucose-Capillary 101 (*)    All other components within normal limits  I-STAT TROPOININ, ED - Abnormal; Notable for the following:    Troponin i, poc 0.33 (*)    All other components within normal limits  CULTURE, BLOOD (ROUTINE X 2)  CULTURE, BLOOD (ROUTINE X 2)  INFLUENZA PANEL BY PCR (TYPE A & B)  MAGNESIUM  PHOSPHORUS  LACTIC ACID, PLASMA  MAGNESIUM  HEPARIN LEVEL (UNFRACTIONATED)  HEPARIN LEVEL (UNFRACTIONATED)    EKG  EKG Interpretation  Date/Time:  Sunday August 15 2016 23:54:04 EDT Ventricular Rate:  99 PR Interval:  160 QRS Duration: 85 QT Interval:  361 QTC Calculation: 464 R Axis:   25 Text Interpretation:  Sinus rhythm Probable left atrial enlargement Anteroseptal infarct, age indeterminate Interpretation limited secondary to artifact Confirmed by Christy Gentles  MD, [place] ([id number]) on 08/16/2016 10:30:47 AM       Radiology No results found.  Procedures Procedures (including critical care time)  Medications Ordered in ED Medications  0.9 %  sodium chloride infusion ( Intravenous Stopped 08/16/16 1117)  dextrose 5 % and 0.9 % NaCl with KCl 40 mEq/L infusion ( Intravenous Stopped  08/17/16 1823)  dextrose 5 % and 0.9 % NaCl with KCl 40 mEq/L infusion ( Intravenous Stopped 08/18/16 1206)  iopamidol (ISOVUE-370) 76 % injection (100 mLs  Contrast Given 08/15/16 2211)   stroke: mapping our early stages of recovery book (1 each Does not apply Given 08/16/16 1400)  gadobenate dimeglumine (MULTIHANCE) injection 15 mL (15 mLs Intravenous Contrast Given 08/16/16 2355)  bisacodyl (DULCOLAX) suppository 10 mg (10 mg Rectal Given 08/18/16 1337)  morphine 2 MG/ML injection 1 mg (1 mg Intravenous Given 08/20/16 2142)     Initial Impression / Assessment and Plan / ED Course  I have reviewed the triage vital signs and the nursing notes.  Pertinent labs & imaging results that were available during my care of the patient were reviewed by me and considered in my medical decision making (see chart for details).    Patient requiring 3 L oxygen via nasal cannula to maintain saturations greater than 90%.CT angiogram demonstrates ongoing right heart strain though clot burden has decreased. Discussed with hospitalist and we'll see patient and admit.  Final Clinical Impressions(s) / ED Diagnoses  1. Hypoxia 2. Bilateral Pulmonary Embolism New Prescriptions Discharge Medication List as of 08/21/2016  1:39 PM    START taking these medications   Details  enoxaparin (LOVENOX) 100 MG/ML injection Inject 1 mL (100 mg total) into the skin daily., Starting Sun 08/22/2016, No Print    Morphine Sulfate (MORPHINE CONCENTRATE) 10 mg / 0.5 ml concentrated solution Place 0.25 mLs (5 mg total) under the tongue every 6 (six) hours as needed for moderate pain, severe pain, anxiety or shortness of breath., Starting Sat 08/21/2016, Print         Julianne Rice, MD 08/18/16 2143    Julianne Rice, MD 08/18/16 2143    Julianne Rice, MD 2016-09-17 [phone]

## 2016-08-16 ENCOUNTER — Inpatient Hospital Stay (HOSPITAL_COMMUNITY): Payer: Medicare Other

## 2016-08-16 ENCOUNTER — Encounter (HOSPITAL_COMMUNITY): Payer: Self-pay | Admitting: *Deleted

## 2016-08-16 DIAGNOSIS — I2609 Other pulmonary embolism with acute cor pulmonale: Secondary | ICD-10-CM | POA: Diagnosis not present

## 2016-08-16 DIAGNOSIS — J9601 Acute respiratory failure with hypoxia: Secondary | ICD-10-CM | POA: Diagnosis not present

## 2016-08-16 DIAGNOSIS — R0600 Dyspnea, unspecified: Secondary | ICD-10-CM | POA: Diagnosis not present

## 2016-08-16 DIAGNOSIS — I63231 Cerebral infarction due to unspecified occlusion or stenosis of right carotid arteries: Secondary | ICD-10-CM | POA: Diagnosis not present

## 2016-08-16 DIAGNOSIS — G936 Cerebral edema: Secondary | ICD-10-CM | POA: Diagnosis not present

## 2016-08-16 DIAGNOSIS — H53462 Homonymous bilateral field defects, left side: Secondary | ICD-10-CM

## 2016-08-16 DIAGNOSIS — I63421 Cerebral infarction due to embolism of right anterior cerebral artery: Secondary | ICD-10-CM | POA: Diagnosis not present

## 2016-08-16 DIAGNOSIS — R200 Anesthesia of skin: Secondary | ICD-10-CM

## 2016-08-16 DIAGNOSIS — G8194 Hemiplegia, unspecified affecting left nondominant side: Secondary | ICD-10-CM

## 2016-08-16 LAB — COMPREHENSIVE METABOLIC PANEL
ALBUMIN: 2.1 g/dL — AB (ref 3.5–5.0)
ALK PHOS: 136 U/L — AB (ref 38–126)
ALT: 38 U/L (ref 17–63)
ANION GAP: 9 (ref 5–15)
AST: 55 U/L — AB (ref 15–41)
BUN: 6 mg/dL (ref 6–20)
CHLORIDE: 104 mmol/L (ref 101–111)
CO2: 20 mmol/L — AB (ref 22–32)
CREATININE: 0.65 mg/dL (ref 0.61–1.24)
Calcium: 8.3 mg/dL — ABNORMAL LOW (ref 8.9–10.3)
GFR calc non Af Amer: >60 mL/min (ref >60)
Glucose, Bld: 132 mg/dL — ABNORMAL HIGH (ref 65–99)
POTASSIUM: 4.1 mmol/L (ref 3.5–5.1)
Sodium: 133 mmol/L — ABNORMAL LOW (ref 135–145)
Total Bilirubin: 1.3 mg/dL — ABNORMAL HIGH (ref 0.3–1.2)
Total Protein: 7.4 g/dL (ref 6.5–8.1)

## 2016-08-16 LAB — BLOOD GAS, ARTERIAL
ACID-BASE DEFICIT: 1.1 mmol/L (ref 0.0–2.0)
Bicarbonate: 22.7 mmol/L (ref 20.0–28.0)
Drawn by: 257081
O2 CONTENT: 3 L/min
O2 Saturation: 93.4 %
PCO2 ART: 34.4 mmHg (ref 32.0–48.0)
PH ART: 7.432 (ref 7.350–7.450)
Patient temperature: 98
pO2, Arterial: 70.5 mmHg — ABNORMAL LOW (ref 83.0–108.0)

## 2016-08-16 LAB — CBC WITH DIFFERENTIAL/PLATELET
BASOS ABS: 0 10*3/uL (ref 0.0–0.1)
Basophils Relative: 0 %
EOS PCT: 6 %
Eosinophils Absolute: 1.1 10*3/uL — ABNORMAL HIGH (ref 0.0–0.7)
HCT: 28.8 % — ABNORMAL LOW (ref 39.0–52.0)
Hemoglobin: 9.3 g/dL — ABNORMAL LOW (ref 13.0–17.0)
LYMPHS PCT: 7 %
Lymphs Abs: 1.2 10*3/uL (ref 0.7–4.0)
MCH: 26.9 pg (ref 26.0–34.0)
MCHC: 32.3 g/dL (ref 30.0–36.0)
MCV: 83.2 fL (ref 78.0–100.0)
MONO ABS: 1.9 10*3/uL — AB (ref 0.1–1.0)
Monocytes Relative: 11 %
Neutro Abs: 12.9 10*3/uL — ABNORMAL HIGH (ref 1.7–7.7)
Neutrophils Relative %: 76 %
PLATELETS: 191 10*3/uL (ref 150–400)
RBC: 3.46 MIL/uL — AB (ref 4.22–5.81)
RDW: 15.7 % — AB (ref 11.5–15.5)
WBC: 17.1 10*3/uL — AB (ref 4.0–10.5)

## 2016-08-16 LAB — CBC
HEMATOCRIT: 30.3 % — AB (ref 39.0–52.0)
HEMOGLOBIN: 9.8 g/dL — AB (ref 13.0–17.0)
MCH: 26.9 pg (ref 26.0–34.0)
MCHC: 32.3 g/dL (ref 30.0–36.0)
MCV: 83.2 fL (ref 78.0–100.0)
Platelets: 181 10*3/uL (ref 150–400)
RBC: 3.64 MIL/uL — AB (ref 4.22–5.81)
RDW: 15.9 % — ABNORMAL HIGH (ref 11.5–15.5)
WBC: 18.5 10*3/uL — AB (ref 4.0–10.5)

## 2016-08-16 LAB — HEPATIC FUNCTION PANEL
ALK PHOS: 133 U/L — AB (ref 38–126)
ALT: 38 U/L (ref 17–63)
AST: 59 U/L — ABNORMAL HIGH (ref 15–41)
Albumin: 1.9 g/dL — ABNORMAL LOW (ref 3.5–5.0)
BILIRUBIN DIRECT: 0.5 mg/dL (ref 0.1–0.5)
BILIRUBIN TOTAL: 1.2 mg/dL (ref 0.3–1.2)
Indirect Bilirubin: 0.7 mg/dL (ref 0.3–0.9)
Total Protein: 6.6 g/dL (ref 6.5–8.1)

## 2016-08-16 LAB — URINALYSIS, ROUTINE W REFLEX MICROSCOPIC
Bilirubin Urine: NEGATIVE
Glucose, UA: NEGATIVE mg/dL
Ketones, ur: 5 mg/dL — AB
Leukocytes, UA: NEGATIVE
NITRITE: NEGATIVE
Protein, ur: 30 mg/dL — AB
SPECIFIC GRAVITY, URINE: 1.014 (ref 1.005–1.030)
Squamous Epithelial / LPF: NONE SEEN
pH: 6 (ref 5.0–8.0)

## 2016-08-16 LAB — MAGNESIUM: Magnesium: 1.7 mg/dL (ref 1.7–2.4)

## 2016-08-16 LAB — APTT: APTT: 37 s — AB (ref 24–36)

## 2016-08-16 LAB — INFLUENZA PANEL BY PCR (TYPE A & B)
Influenza A By PCR: NEGATIVE
Influenza B By PCR: NEGATIVE

## 2016-08-16 LAB — PROTIME-INR
INR: 2.08
PROTHROMBIN TIME: 23.7 s — AB (ref 11.4–15.2)

## 2016-08-16 LAB — HEPARIN LEVEL (UNFRACTIONATED)

## 2016-08-16 LAB — LACTIC ACID, PLASMA: Lactic Acid, Venous: 1.4 mmol/L (ref 0.5–1.9)

## 2016-08-16 LAB — TROPONIN I
TROPONIN I: 0.89 ng/mL — AB (ref <0.03)
TROPONIN I: 0.71 ng/mL — AB (ref <0.03)

## 2016-08-16 LAB — PHOSPHORUS: PHOSPHORUS: 3.2 mg/dL (ref 2.5–4.6)

## 2016-08-16 LAB — GLUCOSE, CAPILLARY: Glucose-Capillary: 128 mg/dL — ABNORMAL HIGH (ref 65–99)

## 2016-08-16 LAB — LIPASE, BLOOD: LIPASE: 117 U/L — AB (ref 11–51)

## 2016-08-16 MED ORDER — SODIUM CHLORIDE 0.9 % IV SOLN
INTRAVENOUS | Status: DC
Start: 2016-08-16 — End: 2016-08-17
  Administered 2016-08-16 (×2): via INTRAVENOUS

## 2016-08-16 MED ORDER — STROKE: EARLY STAGES OF RECOVERY BOOK
Freq: Once | Status: AC
  Administered 2016-08-16: 1
  Filled 2016-08-16: qty 1

## 2016-08-16 MED ORDER — ENSURE ENLIVE PO LIQD: 237.0000 mL | Freq: Two times a day (BID) | ORAL | Status: DC

## 2016-08-16 MED ORDER — GADOBENATE DIMEGLUMINE 529 MG/ML IV SOLN
15.0000 mL | Freq: Once | INTRAVENOUS | Status: AC | PRN
Start: 2016-08-16 — End: 2016-08-16
  Administered 2016-08-16: 15 mL via INTRAVENOUS

## 2016-08-16 MED ORDER — APIXABAN 5 MG PO TABS: 5.0000 mg | ORAL_TABLET | Freq: Two times a day (BID) | ORAL | Status: DC

## 2016-08-16 MED ORDER — HEPARIN (PORCINE) IN NACL 100-0.45 UNIT/ML-% IJ SOLN
1250.0000 [IU]/h | INTRAMUSCULAR | Status: DC
  Administered 2016-08-16 – 2016-08-17 (×2): 1400 [IU]/h via INTRAVENOUS
  Filled 2016-08-16 (×2): qty 250

## 2016-08-16 NOTE — Consult Note (Signed)
Neurology Consult Note  Reason for Consultation: Stroke on CT  Requesting provider: Dessa Phi, MD  CC: Patient presently non-verbal and does not provide any history  HPI: This is a 86-yo man with recent saddle PE, just discharged from the hospital on 08/10/16 with Eliquis. He was seen by home health on 3/18 and was noted to be hypoxic so he was sent to the ED for evaluation and was subsequently admitted.  When the patient's attending (Dr. Maylene Roes) rounded this morning, she found the patient to be poorly responsive. His son at the bedside reported that he became essentially unarousable at about midnight last night. On reviewing the notes in the ED and his H&P, he was noted to be alert and appropriate prior to that point. He was sent for [facility] of the head which showed a large right hemisphere stroke. Neurology is now consulted for further evaluation.   The patient's sons are present at the bedside. They have observed that the patient seems a little more responsive this morning than he was overnight. He moves the R side spontaneously but they have not seen him move his L side at all. They also report that he has not tried to speak to them overnight or this morning. They deny any previous history of stroke.   Of note, he was found to have a pancreatic mass with probable liver metastases. He had US-guided biopsy of one of the liver masses on 08/09/16 which showed metastatic pancreatic adenocarcinoma.   Last known well: around midnight 3/18-->3/19 NHISS score:  tPA given?: No, well outside window, on anticoagulation, obvious ischemic changes noted throughout R hemisphere   PMH:  Past Medical History:  Diagnosis Date  . Acute pancreatitis 10/24/2014  . Acute saddle pulmonary embolism with acute cor pulmonale ([place]) 08/04/2016  . Aortic calcification ([place]) 08/06/2016  . Cancer ([place])    prostates ca  . Hypertension   . Impaired fasting glucose 08/06/2016   Hemoglobin A1c 6.2% March, 2018.  Marland Kitchen  Pyelonephritis 09/15/2012  . Solitary pulmonary nodule 08/06/2016    PSH:  Past Surgical History:  Procedure Laterality Date  . PROSTATE SURGERY      Family history: Family History  Problem Relation Age of Onset  . Other Mother   . Other Father     Social history:  Social History   Social History  . Marital status: Married    Spouse name: N/A  . Number of children: N/A  . Years of education: N/A   Occupational History  . Not on file.   Social History Main Topics  . Smoking status: Never Smoker  . Smokeless tobacco: Never Used  . Alcohol use No  . Drug use: No  . Sexual activity: No   Other Topics Concern  . Not on file   Social History Narrative  . No narrative on file    Current outpatient meds: Medications reviewed and reconciled Current Meds  Medication Sig  . amLODipine (NORVASC) 10 MG tablet Take 1 tablet (10 mg total) by mouth daily.  Marland Kitchen apixaban (ELIQUIS) 5 MG TABS tablet Take 2 tablets (10 mg total) by mouth 2 (two) times daily. (Patient taking differently: Take 5-10 mg by mouth See admin instructions. "10 mg two times a day for one week then 5 mg two times a day after one week")  . pantoprazole (PROTONIX) 40 MG tablet Take 1 tablet (40 mg total) by mouth daily.    Current inpatient meds: Medications reviewed and reconciled Current Facility-Administered Medications  Medication  Dose Route Frequency Provider Last Rate Last Dose  . 0.9 %  sodium chloride infusion   Intravenous Continuous Shon Millet, DO 50 mL/hr at 08/16/16 1117    . acetaminophen (TYLENOL) tablet 650 mg  650 mg Oral Q6H PRN Vianne Bulls, MD       Or  . acetaminophen (TYLENOL) suppository 650 mg  650 mg Rectal Q6H PRN Vianne Bulls, MD      . amLODipine (NORVASC) tablet 10 mg  10 mg Oral Daily Vianne Bulls, MD      . apixaban (ELIQUIS) tablet 10 mg  10 mg Oral BID Vianne Bulls, MD   10 mg at 08/16/16 0103  . [START ON 08/18/2016] apixaban (ELIQUIS) tablet 5 mg  5 mg Oral  BID Ilene Qua Opyd, MD      . feeding supplement (ENSURE ENLIVE) (ENSURE ENLIVE) liquid 237 mL  237 mL Oral BID BM Timothy S Opyd, MD      . ipratropium-albuterol (DUONEB) 0.5-2.5 (3) MG/3ML nebulizer solution 3 mL  3 mL Nebulization Q4H PRN Ilene Qua Opyd, MD      . ondansetron (ZOFRAN) tablet 4 mg  4 mg Oral Q6H PRN Vianne Bulls, MD       Or  . ondansetron (ZOFRAN) injection 4 mg  4 mg Intravenous Q6H PRN Ilene Qua Opyd, MD      . pantoprazole (PROTONIX) EC tablet 40 mg  40 mg Oral Daily Timothy S Opyd, MD      . polyethylene glycol (MIRALAX / GLYCOLAX) packet 17 g  17 g Oral Daily PRN Ilene Qua Opyd, MD      . sodium chloride flush (NS) 0.9 % injection 3 mL  3 mL Intravenous Q12H Ilene Qua Opyd, MD        Allergies: No Known Allergies  ROS: As per HPI. A full 14-point review of systems could not be obtained as the patient is presently non-verbal.   PE:  BP (!) 137/91 (BP Location: Left Arm)   Pulse 100   Temp 98 F (36.7 C) (Axillary)   Resp (!) 22   Ht 6\' 2"  (1.88 m)   Wt 69.7 kg (153 lb 9.6 oz)   SpO2 99%   BMI 19.72 kg/m   General: Thin AA man lying in bed. He opens his eyes to voice He has a R gaze preference and is not able to look past midline to the L. He will follow simple midline and L appendicular commands. He is non-verbal. HEENT: Normocephalic. Neck supple without LAD. MMM. Dentition poor. Sclerae anicteric. No conjunctival injection.  CV: Regular, tachy, no murmur. Carotid pulses normal on L, diminished on R, no bruits. Distal pulses 2+ and symmetric.  Lungs: Coarse breath sounds noted R base.  Abdomen: Soft, non-tender. Bowel sounds present x4.  Extremities: No C/C/E. Neuro:  CN: The R pupil is reactive from 3-->2 mm. The L is partly obscured by corenal clouding but appears reactive. He blinks to visual threat from the R but not the L. he has a R gaze preference without forced deviation. He is able to move his eyes to but not past midline to the L. He has a  diminished corneal on the L. There is a L central VII pattern of weakness. Hearing appears to be intact to conversational voice. Tongue protrudes with apparent L deviation. The remainder of his cranial nerve examination is limited but effort/ability to participate.  Motor: Normal bulk, tone, and strength on R. He  has decreased tone on the L with no movement of the L arm or leg. No tremor or other abnormal movements.  Sensation: He has purposeful withdrawal to pain on the R. He has triple flexion in the LLE and flexion in the LUE to nailbed pressure.   DTRs: 2+ on the R, 3+ on the L. Toes upgoing bilaterally.  Coordination: Exam limited by effort but no overt dysmetria on the R, unable to assess on the L.  Gait: Deferred due to L hemiparesis.   Labs:  Lab Results  Component Value Date   WBC 18.5 (H) 08/16/2016   HGB 9.8 (L) 08/16/2016   HCT 30.3 (L) 08/16/2016   PLT 181 08/16/2016   GLUCOSE 132 (H) 08/16/2016   CHOL 110 08/06/2016   TRIG 99 08/06/2016   HDL 20 (L) 08/06/2016   LDLCALC 70 08/06/2016   ALT 38 08/16/2016   AST 55 (H) 08/16/2016   NA 133 (L) 08/16/2016   K 4.1 08/16/2016   CL 104 08/16/2016   CREATININE 0.65 08/16/2016   BUN 6 08/16/2016   CO2 20 (L) 08/16/2016   PSA 0.06 08/04/2016   INR 2.08 08/16/2016   HGBA1C 6.2 (H) 08/04/2016   Urinalysis notable for moderate hemoglobin, ketones 5, protein 30 Lactate 1.4 Phosphorous 3.2 Magnesium 1.7  Imaging:  I have personally and independently reviewed CT scan of the head without contrast from today. This shows a large area of hypointensity involving the right frontal, temporal, and parietal lobes, consistent with acute to subacute ischemic infarction in the right MCA and ACA territories. There is associated cytotoxic edema with localized mass effect on the right lateral ventricle, no midline shift. There is no hemorrhage. A mild degree of chronic small vessel ischemic change is present. The right common carotid and MCA  appear to be relatively hyperdense, suggesting possible thrombus.  Assessment and Plan:  1. 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 It is most likely embolic in etiology though this could also be due to an underlying hypercoagulable state in the setting of pancreatic cancer. Known risk factors for cerebrovascular disease in this patient include age, hypertension, glucose intolerance. Hemoglobin A1c was 6.2 on 08/04/16. LDL was 70 on 08/06/16. Additional workup will be ordered to include MRI brain, MRA of the head and neck, and TTE. Further testing will be determined by results from these initial studies. He is on anticoagulation with apixaban due to recent PE and this can be continued. Given apparent embolic nature of stroke, there may be little role for statin therapy in this setting since LDL is Arty at goal 70. Ensure adequate glucose control. Allow permissive hypertension in the acute phase, treating only SBP greater than 220 mmHg and/or DBP greater than 110 mmHg. Avoid fever and hyperglycemia as these can extend the infarct. Avoid hypotonic IVF to minimize exacerbation of post-stroke edema. Initiate rehab services. Overall, given underlying metastatic pancreatic cancer and now massive right hemispheric stroke, would favor palliative care consultation to help family to determine goals of care moving forward.  2. Left hemiparesis: This is acute and dense, due to stroke. PT/OT/rehabilitation depending upon goals of care.  3. Left hemisensory loss: This is acute, due to stroke. PT/OT/rehabilitation depending upon goals of care.  4. Left hemianopsia: This is acute, due to stroke. PT/OT/rehabilitation depending upon goals of care.  5. Left hemi-neglect: This is acute, due to stroke. PT/OT/rehabilitation depending upon  goals of care.  6. Acute encephalopathy: This may be multifactorial  in etiology but certainly his large hemispheric stroke has played some role here. For me, he will open his eyes and follow commands but remains nonverbal. Continue supportive care, optimize metabolic status. Minimize CNS active medications, particularly benzodiazepines, opiates, and anything with strong anticholinergic properties. Every effort should be made to normalize circadian rhythms, keeping his room bright and active during the day, dark and quiet at night.  The patient would likely benefit from acute rehab services. Recommend consultation of PT/OT/SLP with consideration for PM&R consult as appropriate depending upon clinical progress and therapists' recommendations.   Fall risk: Risk factors for falls include age; acute stroke resulting in hemiparesis, hemisensory loss, hemi-neglect, hemianopia; impaired mobility/gait; and visual impairments. Fall precautions. Limit psychoactive medications and sedating medications.  Delirium risk: Risk factors for delirium include acute stroke, age, co-morbid illness, severity of medical illness, immobility, sensory impairment (decreased vision), metabolic derangement.   Needs outpatient neurology follow-up?: Yes, depending upon goals of care; this can be arranged at discharge if needed  This was discussed with the patient's sons at the bedside. Education was provided on the diagnosis and expected evaluation and treatment. They are in agreement with the plan as noted. They were given the opportunity to ask any questions and these were addressed to their satisfaction.   Thank you for this consultation. Please feel free to call with any questions or concerns. The stroke team will assume care of the patient beginning 08/17/16.

## 2016-08-16 NOTE — Progress Notes (Addendum)
PROGRESS NOTE    Jared Clements  BTD:[id number] DOB: [birth year] DOA: 08/15/2016 PCP: Harvie Junior, MD     Brief Narrative:  Jared Clements is a 71 y.o. male with medical history significant for prostate cancer status post resection, pancreatic and liver masses status post recent biopsy, and recent diagnosis of acute saddle PE with heart strain, discharge from the hospital on 08/10/2016 with Eliquis and with follow-up with surgery regarding the biopsy. Patient reports that he continued to have a cough and dyspnea at home and describes his chest pain as slowly improving. He has noted his cough to have become a little worse and now productive of thick yellow sputum. He has been dyspneic with minimal exertion, but feels that this is fairly stable from time of recent discharge. He denies fevers or chills and denies leg swelling or orthopnea. He reports continued adherence with the anticoagulant. Home health nurse visited him this afternoon, noted him to be hypoxic, and directed him to the emergency department for further evaluation. He was admitted due to acute hypoxemic respiratory failure, due to recent PE.   Assessment & Plan:   Principal Problem:   Acute respiratory failure with hypoxia (HCC) Active Problems:   Essential hypertension   Subacute pulmonary embolism with acute cor pulmonale (HCC)   Elevated troponin   Demand ischemia (HCC)   Anemia of chronic disease   Pancreatic mass   Liver metastases (HCC)  Acute hypoxemic respiratory failure -Discharged from hospital 08/10/16 after acute management of bilateral PE with heart strain. Has been adherent to Eliquis. Repeat CTA chest with improvement in PE and RV-to-LV ratio. Did not meet home O2 requirement at time of previous discharge. -[place] O2. Wean as able, home desat screen when able   Acute encephalopathy -Patient has been unarousable since midnight, per son at bedside -Stat ABG, labs, CT head this morning -Addendum:  -Stat CT head with  acute infarct involving the anterior and middle cerebral arteries on the right with extensive brain parenchymal involvement on the right as summarized above. No hemorrhage. -Consult neurology, spoke with Dr. Shon Hale  -ABG okay. Other labs pending   PE with heart strain -Eliquis  Elevated troponin -Due to PE, previously admission with peak of troponin at 9.7 -Had pleuritic chest pain prior to admission which has been improving. No further chest pain or pressure per son's report  Metastatic pancreatic cancer -Pancreatic mass noted on CT during recent admission, as well as numerous liver masses concerning for mets. US-guided biopsy of liver lesion performed on 08/09/16, which showed adenocarcinoma with morphology consistent with metastatic pancreatic adenocarcinoma  -Discussed with son this morning as well as at time of admission. Unsure if son understands gravity of patient's condition and prognosis  -Spoke with Dr. Earlie Server today; he will arrange for outpatient oncology appointment to follow patient  -Palliative care consulted for goals of care   SIRS -Without evidence of infection. Patient is tachycardic, tachypneic, with leukocytosis, but afebrile. CTA chest without pneumonia, influenza screen negative  -Blood cultures pending -Sputum culture pending  -Started on levaquin empirically but will hold for now without gross evidence of infection. Could be secondary to cancer and above illnesses as outlined above  -Check UA  -IVF   HTN -Norvasc  Chronic normocytic anemia -Stable, baseline Hgb around 9-10    DVT prophylaxis: Eliquis Code Status: Full. Discussed with son this morning. He is Scientist, research (medical). Currently full code. Unsure if son understands gravity of patient's condition and prognosis  Family Communication: son at  bedside Disposition Plan: pending further stabilization    Consultants:   Palliative care   Oncology to follow as outpatient   Neurology    Procedures:   None  Antimicrobials:   Levaquin     Subjective: Patient somnolent and not arousable to voice on my exam.   Son at bedside tells me that patient has been like this since midnight. In the ED and prior to admission, patient had otherwise been alert and appropriate, but now unarousable to voice and physical stimuli.   Objective: Vitals:   08/16/16 0058 08/16/16 0123 08/16/16 0533 08/16/16 0542  BP: (!) 164/91   (!) 137/91  Pulse: (!) 110  99 100  Resp: (!) 24   (!) 22  Temp: 98.6 F (37 C)   98 F (36.7 C)  TempSrc: Oral   Axillary  SpO2: 98%   99%  Weight:  69.7 kg (153 lb 9.6 oz)    Height:  6\' 2"  (1.88 m)      Intake/Output Summary (Last 24 hours) at 08/16/16 0945 Last data filed at 08/16/16 0730  Gross per 24 hour  Intake              457 ml  Output              700 ml  Net             -243 ml   Filed Weights   08/15/16 2010 08/16/16 0123  Weight: 72.6 kg (160 lb) 69.7 kg (153 lb 9.6 oz)    Examination:  General exam: Unarousable  ENT: poor dentition  Respiratory system: Clear to auscultation. Respiratory effort normal on [place] O2  Cardiovascular system: S1 & S2 heard, Tachycardic, regular rhythm. No JVD, murmurs, rubs, gallops or clicks. No pedal edema. Gastrointestinal system: Abdomen is nondistended, soft and nontender. No organomegaly or masses felt. Normal bowel sounds heard. Central nervous system: Unarousable to voice of physical stimuli, moves RUE RLE LLE extremities spontaneously, LUE flaccid  Extremities: Symmetric  Skin: No rashes, lesions or ulcers on visible skin   Data Reviewed: I have personally reviewed following labs and imaging studies  CBC:  Recent Labs Lab 08/10/16 0205 08/15/16 2010 08/16/16 0224 08/16/16 0845  WBC 13.3* 19.9* 17.1* 18.5*  NEUTROABS  --   --  12.9*  --   HGB 9.2* 10.7* 9.3* 9.8*  HCT 28.4* 32.4* 28.8* 30.3*  MCV 83.3 83.5 83.2 83.2  PLT 303 203 191 244   Basic Metabolic Panel:  Recent Labs Lab  08/15/16 2010  NA 133*  K 3.6  CL 98*  CO2 20*  GLUCOSE 108*  BUN 9  CREATININE 0.84  CALCIUM 8.4*   GFR: Estimated Creatinine Clearance: 77.2 mL/min (by C-G formula based on SCr of 0.84 mg/dL). Liver Function Tests:  Recent Labs Lab 08/15/16 2010  AST 59*  ALT 38  ALKPHOS 133*  BILITOT 1.2  PROT 6.6  ALBUMIN 1.9*    Recent Labs Lab 08/15/16 2010  LIPASE 117*   No results for input(s): AMMONIA in the last 168 hours. Coagulation Profile: No results for input(s): INR, PROTIME in the last 168 hours. Cardiac Enzymes:  Recent Labs Lab 08/16/16 0224  TROPONINI 0.89*   BNP (last 3 results) No results for input(s): PROBNP in the last 8760 hours. HbA1C: No results for input(s): HGBA1C in the last 72 hours. CBG:  Recent Labs Lab 08/09/16 1116 08/09/16 2115 08/10/16 0614 08/10/16 1140 08/16/16 0827  GLUCAP 81 115* 107* 142* 128*  Lipid Profile: No results for input(s): CHOL, HDL, LDLCALC, TRIG, CHOLHDL, LDLDIRECT in the last 72 hours. Thyroid Function Tests: No results for input(s): TSH, T4TOTAL, FREET4, T3FREE, THYROIDAB in the last 72 hours. Anemia Panel: No results for input(s): VITAMINB12, FOLATE, FERRITIN, TIBC, IRON, RETICCTPCT in the last 72 hours. Sepsis Labs: No results for input(s): PROCALCITON, LATICACIDVEN in the last 168 hours.  No results found for this or any previous visit (from the past 240 hour(s)).     Radiology Studies: Ct Angio Chest Pe W And/or Wo Contrast  Result Date: 08/15/2016 CLINICAL DATA:  Central non radiating chest pain. Shortness of breath. Recent admission to hospital for same. Productive cough. EXAM: CT ANGIOGRAPHY CHEST WITH CONTRAST TECHNIQUE: Multidetector CT imaging of the chest was performed using the standard protocol during bolus administration of intravenous contrast. Multiplanar CT image reconstructions and MIPs were obtained to evaluate the vascular anatomy. CONTRAST:  100 mL Isovue 370 COMPARISON:  08/04/2016  FINDINGS: Cardiovascular: Persistent pulmonary emboli are demonstrated in the right upper lung, right lower lung, and left lower lung segmental and subsegmental pulmonary arteries. There is mild improvement since the previous study with partial interval recanalization demonstrated vertically in the left. There is no definite evidence of any progression. The RV to LV ratio remains elevated at 1.4, improved since previous study. This could indicate persistence of right heart strain. Mild cardiac enlargement. Small pericardial effusion. Normal caliber thoracic aorta with scattered calcifications. Mediastinum/Nodes: Scattered lymph nodes are not pathologically enlarged. Esophagus is decompressed. Thyroid gland is unremarkable. Lungs/Pleura: Evaluation is limited due to motion artifact. There is atelectasis in the lung bases. No discrete consolidation. No pleural effusions. No pneumothorax. Airways are grossly clear. Upper Abdomen: Multiple liver lesions are demonstrated, largest measuring about 4 cm diameter in the lateral segment left lobe. These were better seen on previous contrast-enhanced CT abdomen and pelvis from 08/06/2016 and likely represent metastatic disease. The pancreatic lesion and pancreatic ductal dilatation seen on previous CT are visualized but less well defined without contrast. Changes are suspicious for pancreatic carcinoma and liver metastasis. There is a left adrenal gland nodule measuring about 12 mm in diameter. Musculoskeletal: No chest wall abnormality. No acute or significant osseous findings. Review of the MIP images confirms the above findings. IMPRESSION: Persistent bilateral pulmonary emboli, demonstrating improvement since the previous study. This suggests response to interval therapy. No progression of pulmonary emboli. RV to LV ratio remains elevated at 1.4 although improved since previous study. This could indicate persistence of right heart strain. Mass lesion in the body of the  pancreas with pancreatic ductal dilatation and multiple hepatic metastases are again demonstrated but less well defined than on the previous contrast-enhanced CT abdomen and pelvis. Lung nodules seen previously are not well defined on today's study due to motion artifact and atelectasis. Electronically Signed   By: Lucienne Capers M.D.   On: 08/15/2016 22:40   Dg Chest Port 1 View  Result Date: 08/15/2016 CLINICAL DATA:  Central non radiating chest pain. Shortness of breath. Patient was diagnosed with pulmonary embolus on 08/04/2016. EXAM: PORTABLE CHEST 1 VIEW COMPARISON:  CT of the chest 08/04/2016 FINDINGS: The cardiac silhouette is mildly enlarged. There is no evidence of focal airspace consolidation, pleural effusion or pneumothorax. Low lung volumes. Osseous structures are without acute abnormality. Soft tissues are grossly normal. IMPRESSION: No radiographic evidence of acute cardiopulmonary disease. Electronically Signed   By: Fidela Salisbury M.D.   On: 08/15/2016 20:30      Scheduled Meds: . amLODipine  10 mg Oral Daily  . apixaban  10 mg Oral BID  . [START ON 08/18/2016] apixaban  5 mg Oral BID  . feeding supplement (ENSURE ENLIVE)  237 mL Oral BID BM  . pantoprazole  40 mg Oral Daily  . sodium chloride flush  3 mL Intravenous Q12H   Continuous Infusions: . sodium chloride 60 mL/hr (08/16/16 0104)     LOS: 1 day    Time spent: 40 minutes   Dessa Phi, DO [facility] [email or web address] Password [email or web address] 08/16/2016, 9:45 AM

## 2016-08-16 NOTE — Progress Notes (Signed)
ANTICOAGULATION CONSULT NOTE - Follow Up Consult  Pharmacy Consult for Heparin Indication: pulmonary embolus  No Known Allergies  Patient Measurements: Height: 6\' 2"  (188 cm) Weight: 153 lb 9.6 oz (69.7 kg) IBW/kg (Calculated) : 82.2 Heparin Dosing Weight:  77.1 kg  Vital Signs: Temp: 98.8 F (37.1 C) (03/19 1300) Temp Source: Axillary (03/19 1300) BP: 125/88 (03/19 1300) Pulse Rate: 100 (03/19 1300)  Labs:  Recent Labs  08/15/16 2010 08/16/16 0224 08/16/16 0845 08/16/16 0859  HGB 10.7* 9.3* 9.8*  --   HCT 32.4* 28.8* 30.3*  --   PLT 203 191 181  --   LABPROT  --   --  23.7*  --   INR  --   --  2.08  --   CREATININE 0.84  --  0.65  --   TROPONINI  --  0.89*  --  0.71*    Estimated Creatinine Clearance: 81.1 mL/min (by C-G formula based on SCr of 0.65 mg/dL).   Assessment: 71 yo male with PE. Not an EKOS candidate d/t multiple liver lesions suspicious for metastatic cancer. He was changed to [place] (received one dose of 10mg  3/18 at 0100). CT of head this am shows acute ischemic stroke involving right ACA and right MCA territories. Patient currently npo after failing swallow exam so bridging with heparin, neurology consulted and recommended continuation of anticoagulation.  Give acute cva will not bolus heparin, also due to recent apixaban heparin levels will likely not be useful so will follow aptt primarily.   Goal of Therapy:  Aptt goal 66-102 Heparin level 0.3-0.7 units/ml   Monitor platelets by anticoagulation protocol: Yes   Plan:  Start heparin at 1400 units/hr F/u 8 hr heparin level  Erin Hearing PharmD., BCPS Clinical Pharmacist Pager [phone] 08/16/2016 5:39 PM

## 2016-08-16 NOTE — Progress Notes (Signed)
Initial Nutrition Assessment  DOCUMENTATION CODES:   Non-severe (moderate) malnutrition in context of chronic illness  INTERVENTION:    Await swallow evaluation/SLP recommendations, add interventions accordingly  NUTRITION DIAGNOSIS:   Malnutrition related to catabolic illness as evidenced by moderate depletions of muscle mass, moderate depletion of body fat  GOAL:   Patient will meet greater than or equal to 90% of their needs  MONITOR:   PO intake, Supplement acceptance, Labs, Weight trends, I & O's  REASON FOR ASSESSMENT:   Malnutrition Screening Tool  ASSESSMENT:   71 y.o. Male with medical history significant for prostate cancer status post resection, pancreatic and liver masses status post recent biopsy, and recent diagnosis of acute saddle PE with heart strain;admitted due to acute hypoxemic respiratory failure, due to recent PE   Pt is currently unarousable. Familiar to this RD during most recent hospital admission. Noted pt did not pass stroke swallow screen.  Speech Path consulted. Medications reviewed and include Zofran and Protonix. Labs reviewed.  Sodium 133 (L). CBG 128.  Nutrition-Focused physical exam completed. Findings are moderate fat depletion, moderate muscle depletion, and no edema.   Diet Order:  Diet NPO time specified  Skin:  Reviewed, no issues  Last BM:  3/12  Height:   Ht Readings from Last 1 Encounters:  08/16/16 6\' 2"  (1.88 m)    Weight:   Wt Readings from Last 1 Encounters:  08/16/16 153 lb 9.6 oz (69.7 kg)   Wt Readings from Last 10 Encounters:  08/16/16 153 lb 9.6 oz (69.7 kg)  08/10/16 150 lb 4.8 oz (68.2 kg)  10/28/14 167 lb 14.1 oz (76.1 kg)  10/21/14 170 lb (77.1 kg)  09/15/12 163 lb 8 oz (74.2 kg)    Ideal Body Weight:  75.4 kg  BMI:  Body mass index is 19.72 kg/m.  Estimated Nutritional Needs:   Kcal:  1700-1900  Protein:  80-90 gm  Fluid:  1.7-1.9 L  EDUCATION NEEDS:   No education needs identified  at this time  Arthur Holms, RD, LDN Pager #: [phone] After-Hours Pager #: [phone]

## 2016-08-16 NOTE — Progress Notes (Signed)
Morning dose of eliquis and norvasc on hold for now. Pt has been unarousable all am. MD aware. Swallow evaluation ordered. Will continue to monitor pt and meds will be given, if appropriate.  Grant Fontana BSN, RN

## 2016-08-16 NOTE — Evaluation (Signed)
Clinical/Bedside Swallow Evaluation Patient Details  Name: Jared Clements MRN: [id number] Date of Birth: December 02, 1945  Today's Date: 08/16/2016 Time: SLP Start Time (ACUTE ONLY): 6606 SLP Stop Time (ACUTE ONLY): 1412 SLP Time Calculation (min) (ACUTE ONLY): 17 min  Past Medical History:  Past Medical History:  Diagnosis Date  . Acute pancreatitis 10/24/2014  . Acute saddle pulmonary embolism with acute cor pulmonale ([place]) 08/04/2016  . Aortic calcification ([place]) 08/06/2016  . Cancer ([place])    prostates ca  . Hypertension   . Impaired fasting glucose 08/06/2016   Hemoglobin A1c 6.2% March, 2018.  Marland Kitchen Pyelonephritis 09/15/2012  . Solitary pulmonary nodule 08/06/2016   Past Surgical History:  Past Surgical History:  Procedure Laterality Date  . PROSTATE SURGERY     HPI:  Ptis a 71 y.o.malewith PMH significant for prostate cancer status post resection, pancreatic and liver masses status post recent biopsy, and recent diagnosis of acute saddle PE with heart strain, discharge from the hospital on 08/10/2016 with Eliquisand with follow-up with surgery regarding the biopsy. Patient reports that he has continued to have a productive cough and dyspnea at home. CXR showed no radiographic evidence of acute cardiopulmonary disease. CT of head showed Evidence of acute infarct involving the anterior and middle cerebral arteries on the right with extensive brain parenchymal involvement on the right as summarized above. No hemorrhage.    Assessment / Plan / Recommendation Clinical Impression  [name] was drowsy and not alert throughout the bedside swallow evaluation despite multiple attempts to arouse patient. A full oral motor assessment was unable to be completed due to pt's mentation, however noted left facial asymmetry and reduced ROM and strength. SLP provided oral care due to dried secretions in pt's oral cavity. [name] required maximal verbal, visual, and tactile cues to attend to tasks and trials of ice  chips. Oral phase impairments included reduced labial seal and holding with ice chips via spoon. Suspect delayed swallow initiation and decreased hyoid-laryngeal movement. Due to decreased alertness, awareness, and drowsiness, recommend NPO. ST will continue to follow up for treatment for possible initiation of diet and instrumental swallow evaluation. SLP Visit Diagnosis: Dysphagia, unspecified (R13.10)    Aspiration Risk  Moderate aspiration risk;Severe aspiration risk    Diet Recommendation NPO   Medication Administration: Via alternative means    Other  Recommendations Oral Care Recommendations: Oral care QID   Follow up Recommendations Skilled Nursing facility      Frequency and Duration min 2x/week  2 weeks       Prognosis Prognosis for Safe Diet Advancement: Good Barriers to Reach Goals: Severity of deficits      Swallow Study   General HPI: Ptis a 71 y.o.malewith PMH significant for prostate cancer status post resection, pancreatic and liver masses status post recent biopsy, and recent diagnosis of acute saddle PE with heart strain, discharge from the hospital on 08/10/2016 with Eliquisand with follow-up with surgery regarding the biopsy. Patient reports that he has continued to have a productive cough and dyspnea at home. CXR showed no radiographic evidence of acute cardiopulmonary disease. CT of head showed Evidence of acute infarct involving the anterior and middle cerebral arteries on the right with extensive brain parenchymal involvement on the right as summarized above. No hemorrhage.  Type of Study: Bedside Swallow Evaluation Previous Swallow Assessment: none noted Diet Prior to this Study: NPO Temperature Spikes Noted: No Respiratory Status: Nasal cannula History of Recent Intubation: No Behavior/Cognition: Lethargic/Drowsy;Requires cueing Oral Cavity Assessment: Dried secretions Oral Care  Completed by SLP: Yes Oral Cavity - Dentition: Missing dentition;Poor  condition Vision:  (kept eyes closed) Self-Feeding Abilities: Needs assist;Needs set up Patient Positioning: Upright in bed Baseline Vocal Quality: Low vocal intensity Volitional Cough: Cognitively unable to elicit Volitional Swallow: Unable to elicit    Oral/Motor/Sensory Function Overall Oral Motor/Sensory Function: Moderate impairment Facial ROM: Reduced left;Suspected CN VII (facial) dysfunction Facial Symmetry: Abnormal symmetry left;Suspected CN VII (facial) dysfunction Facial Strength: Reduced left;Suspected CN VII (facial) dysfunction Lingual ROM: Other (Comment) (unable to assess (mentation)) Lingual Symmetry: Other (Comment) (unable to assess (mentation)) Lingual Strength: Other (Comment) (unable to assess (mentation)) Velum: Within Functional Limits   Ice Chips Ice chips: Impaired Presentation: Spoon Oral Phase Impairments: Reduced labial seal;Poor awareness of bolus Oral Phase Functional Implications: Oral holding;Prolonged oral transit Pharyngeal Phase Impairments: Suspected delayed Swallow;Decreased hyoid-laryngeal movement   Thin Liquid Thin Liquid: Not tested    Nectar Thick Nectar Thick Liquid: Not tested   Honey Thick Honey Thick Liquid: Not tested   Puree Puree: Not tested   Solid   GO   Solid: Not tested        Fransisca Kaufmann , Student-SLP 08/16/2016,2:54 PM

## 2016-08-17 DIAGNOSIS — Z7189 Other specified counseling: Secondary | ICD-10-CM

## 2016-08-17 DIAGNOSIS — C259 Malignant neoplasm of pancreas, unspecified: Secondary | ICD-10-CM | POA: Diagnosis not present

## 2016-08-17 DIAGNOSIS — Z515 Encounter for palliative care: Secondary | ICD-10-CM | POA: Diagnosis not present

## 2016-08-17 DIAGNOSIS — I63231 Cerebral infarction due to unspecified occlusion or stenosis of right carotid arteries: Secondary | ICD-10-CM | POA: Diagnosis not present

## 2016-08-17 DIAGNOSIS — Z7901 Long term (current) use of anticoagulants: Secondary | ICD-10-CM | POA: Diagnosis not present

## 2016-08-17 DIAGNOSIS — J9601 Acute respiratory failure with hypoxia: Principal | ICD-10-CM

## 2016-08-17 DIAGNOSIS — I2699 Other pulmonary embolism without acute cor pulmonale: Secondary | ICD-10-CM

## 2016-08-17 DIAGNOSIS — E43 Unspecified severe protein-calorie malnutrition: Secondary | ICD-10-CM

## 2016-08-17 DIAGNOSIS — I639 Cerebral infarction, unspecified: Secondary | ICD-10-CM

## 2016-08-17 DIAGNOSIS — J9690 Respiratory failure, unspecified, unspecified whether with hypoxia or hypercapnia: Secondary | ICD-10-CM | POA: Diagnosis not present

## 2016-08-17 DIAGNOSIS — C787 Secondary malignant neoplasm of liver and intrahepatic bile duct: Secondary | ICD-10-CM

## 2016-08-17 DIAGNOSIS — R0600 Dyspnea, unspecified: Secondary | ICD-10-CM | POA: Diagnosis not present

## 2016-08-17 DIAGNOSIS — Z8546 Personal history of malignant neoplasm of prostate: Secondary | ICD-10-CM | POA: Diagnosis not present

## 2016-08-17 LAB — GLUCOSE, CAPILLARY: Glucose-Capillary: 108 mg/dL — ABNORMAL HIGH (ref 65–99)

## 2016-08-17 LAB — BASIC METABOLIC PANEL
Anion gap: 10 (ref 5–15)
BUN: 7 mg/dL (ref 6–20)
CHLORIDE: 101 mmol/L (ref 101–111)
CO2: 22 mmol/L (ref 22–32)
CREATININE: 0.72 mg/dL (ref 0.61–1.24)
Calcium: 8.3 mg/dL — ABNORMAL LOW (ref 8.9–10.3)
GFR calc Af Amer: >60 mL/min (ref >60)
GFR calc non Af Amer: >60 mL/min (ref >60)
GLUCOSE: 108 mg/dL — AB (ref 65–99)
POTASSIUM: 3.4 mmol/L — AB (ref 3.5–5.1)
SODIUM: 133 mmol/L — AB (ref 135–145)

## 2016-08-17 LAB — APTT
APTT: 97 s — AB (ref 24–36)
APTT: 97 s — AB (ref 24–36)
aPTT: 121 seconds — ABNORMAL HIGH (ref 24–36)

## 2016-08-17 LAB — CBC
HCT: 29.1 % — ABNORMAL LOW (ref 39.0–52.0)
HEMOGLOBIN: 9.5 g/dL — AB (ref 13.0–17.0)
MCH: 27.1 pg (ref 26.0–34.0)
MCHC: 32.6 g/dL (ref 30.0–36.0)
MCV: 82.9 fL (ref 78.0–100.0)
Platelets: 192 10*3/uL (ref 150–400)
RBC: 3.51 MIL/uL — ABNORMAL LOW (ref 4.22–5.81)
RDW: 15.9 % — ABNORMAL HIGH (ref 11.5–15.5)
WBC: 18.7 10*3/uL — ABNORMAL HIGH (ref 4.0–10.5)

## 2016-08-17 LAB — HEPARIN LEVEL (UNFRACTIONATED): HEPARIN UNFRACTIONATED: 1.52 [IU]/mL — AB (ref 0.30–0.70)

## 2016-08-17 MED ORDER — KCL IN DEXTROSE-NACL 40-5-0.9 MEQ/L-%-% IV SOLN
INTRAVENOUS | Status: AC
  Administered 2016-08-17: 09:00:00 via INTRAVENOUS
  Filled 2016-08-17: qty 1000

## 2016-08-17 MED ORDER — MORPHINE SULFATE (PF) 2 MG/ML IV SOLN: 2.0000 mg | INTRAVENOUS | Status: DC | PRN

## 2016-08-17 MED ORDER — HEPARIN (PORCINE) IN NACL 100-0.45 UNIT/ML-% IJ SOLN
1150.0000 [IU]/h | INTRAMUSCULAR | Status: DC
  Administered 2016-08-18: 1100 [IU]/h via INTRAVENOUS
  Administered 2016-08-19 – 2016-08-20 (×3): 1150 [IU]/h via INTRAVENOUS
  Filled 2016-08-17 (×4): qty 250

## 2016-08-17 MED ORDER — ACETAMINOPHEN 650 MG RE SUPP: 650.0000 mg | Freq: Four times a day (QID) | RECTAL | Status: DC | PRN

## 2016-08-17 MED ORDER — HEPARIN (PORCINE) IN NACL 100-0.45 UNIT/ML-% IJ SOLN
1250.0000 [IU]/h | INTRAMUSCULAR | Status: DC
  Administered 2016-08-17: 1250 [IU]/h via INTRAVENOUS

## 2016-08-17 MED ORDER — KCL IN DEXTROSE-NACL 40-5-0.9 MEQ/L-%-% IV SOLN
INTRAVENOUS | Status: AC
  Administered 2016-08-17: 23:00:00 via INTRAVENOUS
  Filled 2016-08-17 (×2): qty 1000

## 2016-08-17 MED ORDER — BISACODYL 10 MG RE SUPP: 10.0000 mg | Freq: Every day | RECTAL | Status: DC | PRN

## 2016-08-17 NOTE — Consult Note (Signed)
[place] CONSULT NOTE  Patient Care Team: Harvie Junior, MD as PCP - General (Specialist)  CHIEF COMPLAINTS/PURPOSE OF CONSULTATION:  Metastatic pancreatic cancer to the liver, significant PE with cor pulmonale, history of prostate cancer and recent diagnosis of stroke  HISTORY OF PRESENTING ILLNESS:  Jared Clements 71 y.o. male is admitted to the hospital 2 days ago after presentation with decreased oxygen saturation, increased work of breathing, hypotensive and tachycardia. This is his second admission recently. I am not able to obtain any history from the patient as he is not very responsive According to his son, the patient has history of prostate cancer diagnosed around 2013, treated by urologist with surgery and chemotherapy.  He has been cancer free since then. Prior to this admission, he was hospitalized between 08/04/2016 to 08/10/2016. He was admitted to the hospital after presentation with hypoxic respiratory failure.  CT angiogram shows significant pulmonary emboli with right heart strain.  Imaging studies show evidence of metastatic cancer to the liver. On 08/09/2016, he had liver biopsy, results pending. He was discharged home on anticoagulation therapy but with worsening symptoms, he is now being admitted again for evaluation and management. Liver biopsy show evidence of metastatic adenocarcinoma consistent with  pancreatic primary. Repeat PSA from 08/04/2016 was 0.06.  CEA is mildly elevated and CA-19-9 was within normal limits With reduced mental status, he underwent CT scan and MRI of the head.  He was found to have large right-sided acute infarct with mild edema and evidence of scattered acute ischemia on both lobes.  Further imaging revealed occluded right MCA and right ACA. He is currently obtunded. Family members have noted he has very poor nutrition.  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  He does not smoke. No  seizures were noted since hospitalization.  MEDICAL HISTORY:  Past Medical History:  Diagnosis Date  . Acute pancreatitis 10/24/2014  . Acute saddle pulmonary embolism with acute cor pulmonale ([place]) 08/04/2016  . Aortic calcification ([place]) 08/06/2016  . Cancer ([place])    prostates ca  . Hypertension   . Impaired fasting glucose 08/06/2016   Hemoglobin A1c 6.2% March, 2018.  Marland Kitchen Pyelonephritis 09/15/2012  . Solitary pulmonary nodule 08/06/2016    SURGICAL HISTORY: Past Surgical History:  Procedure Laterality Date  . PROSTATE SURGERY      SOCIAL HISTORY: Social History   Social History  . Marital status: Married    Spouse name: N/A  . Number of children: N/A  . Years of education: N/A   Occupational History  . Not on file.   Social History Main Topics  . Smoking status: Never Smoker  . Smokeless tobacco: Never Used  . Alcohol use No  . Drug use: No  . Sexual activity: No   Other Topics Concern  . Not on file   Social History Narrative  . No narrative on file    FAMILY HISTORY: Family History  Problem Relation Age of Onset  . Other Mother   . Other Father     ALLERGIES:  has No Known Allergies.  MEDICATIONS:  Current Facility-Administered Medications  Medication Dose Route Frequency Provider Last Rate Last Dose  . acetaminophen (TYLENOL) suppository 650 mg  650 mg Rectal Q6H PRN Jannette Fogo, NP      . bisacodyl (DULCOLAX) suppository 10 mg  10 mg Rectal Daily PRN Jannette Fogo, NP      . dextrose 5 % and 0.9 % NaCl with KCl  40 mEq/L infusion   Intravenous Continuous Shon Millet, DO 100 mL/hr at 08/17/16 6644    . heparin ADULT infusion 100 units/mL (25000 units/229mL sodium chloride 0.45%)  1,250 Units/hr Intravenous Continuous Rich Fuchs Choi, DO 12.5 mL/hr at 08/17/16 1354 1,250 Units/hr at 08/17/16 1354  . ipratropium-albuterol (DUONEB) 0.5-2.5 (3) MG/3ML nebulizer solution 3 mL  3 mL Nebulization Q4H PRN Vianne Bulls, MD   3  mL at 08/17/16 0645  . morphine 2 MG/ML injection 2 mg  2 mg Intravenous Q4H PRN Jannette Fogo, NP      . ondansetron [facility]) injection 4 mg  4 mg Intravenous Q6H PRN Ilene Qua Opyd, MD      . sodium chloride flush (NS) 0.9 % injection 3 mL  3 mL Intravenous Q12H Vianne Bulls, MD   3 mL at 08/16/16 2151    REVIEW OF SYSTEMS:  Unable to obtain  PHYSICAL EXAMINATION: ECOG PERFORMANCE STATUS: 4 - Bedbound  Vitals:   08/17/16 0445 08/17/16 1011  BP: (!) 157/80 140/79  Pulse: (!) 102 98  Resp: 20 20  Temp: 98.7 F (37.1 C) 98.6 F (37 C)   Filed Weights   08/15/16 2010 08/16/16 0123 08/17/16 0043  Weight: 160 lb (72.6 kg) 153 lb 9.6 oz (69.7 kg) 154 lb 8 oz (70.1 kg)    GENERAL: He is sleeping and not arousable.  He appears thin and cachectic SKIN: skin color, texture, turgor are normal, no rashes or significant lesions EYES: Eyes are closed OROPHARYNX: Poor dentition is noted NECK: supple, thyroid normal size, non-tender, without nodularity LYMPH:  no palpable lymphadenopathy in the cervical, axillary or inguinal LUNGS: clear to auscultation and percussion with increased breathing effort HEART: regular rate & rhythm and no murmurs and no lower extremity edema ABDOMEN:abdomen soft, non-tender and normal bowel sounds Musculoskeletal:no cyanosis of digits and no clubbing  PSYCH: He is not alert NEURO: Unable to assess  LABORATORY DATA:  I have reviewed the data as listed Lab Results  Component Value Date   WBC 18.7 (H) 08/17/2016   HGB 9.5 (L) 08/17/2016   HCT 29.1 (L) 08/17/2016   MCV 82.9 08/17/2016   PLT 192 08/17/2016    Recent Labs  08/08/16 1120 08/09/16 0139 08/15/16 2010 08/16/16 0845 08/17/16 0222  NA  --  134* 133* 133* 133*  K  --  4.0 3.6 4.1 3.4*  CL  --  104 98* 104 101  CO2  --  23 20* 20* 22  GLUCOSE  --  91 108* 132* 108*  BUN  --  9 9 6 7   CREATININE  --  0.70 0.84 0.65 0.72  CALCIUM  --  8.1* 8.4* 8.3* 8.3*  GFRNONAA  --  >60 >60  >60 >60  GFRAA  --  >60 >60 >60 >60  PROT 6.4* 6.6 6.6 7.4  --   ALBUMIN 1.9* 1.9* 1.9* 2.1*  --   AST 33 34 59* 55*  --   ALT 25 26 38 38  --   ALKPHOS 128* 126 133* 136*  --   BILITOT 0.9 0.6 1.2 1.3*  --   BILIDIR 0.3  --  0.5  --   --   IBILI 0.6  --  0.7  --   --     RADIOGRAPHIC STUDIES: I reviewed multiple imaging study with patient and family I have personally reviewed the radiological images as listed and agreed with the findings in the report. Ct Head Wo Contrast  Addendum Date: 08/16/2016   ADDENDUM REPORT: 08/16/2016 10:14 ADDENDUM: Critical Value/emergent results were called by telephone at the time of interpretation on 08/16/2016 at 10:14 am to Dr. Dessa Phi , who verbally acknowledged these results. Electronically Signed   By: Lowella Grip III M.D.   On: 08/16/2016 10:14   Result Date: 08/16/2016 CLINICAL DATA:  Altered mental status with inability to arouse EXAM: CT HEAD WITHOUT CONTRAST TECHNIQUE: Contiguous axial images were obtained from the base of the skull through the vertex without intravenous contrast. COMPARISON:  Oct 25, 2008 FINDINGS: Brain: There is underlying mild diffuse atrophy. There is cytotoxic edema throughout the right frontal lobe, throughout all but the posterior most aspect the right temporal lobe, the mid and anterior right occipital lobe, and anterior to mid right parietal lobe. There is decreased attenuation throughout most of the right basal ganglia region. There is sparing of the right thalamus. There is subtle effacement of portions of the right lateral ventricle. There is no midline shift, however. There is no evidence of hemorrhage. There is no extra-axial fluid collection. Mild periventricular small vessel disease is noted to the left of midline. Vascular: There is hyperdensity in the proximal right middle cerebral artery as well as in the distal most aspect of the right common carotid artery. Calcification is noted in each carotid siphon  region. Skull: The bony calvarium appears intact. Sinuses/Orbits: There is mucosal thickening in multiple ethmoid air cells bilaterally. Frontal sinuses are hypoplastic. There is slight mucosal thickening in each maxillary antrum. Other visualized paranasal sinuses are clear. Orbits appear symmetric bilaterally. Other: Mastoid air cells are clear. IMPRESSION: Evidence of acute infarct involving the anterior and middle cerebral arteries on the right with extensive brain parenchymal involvement on the right as summarized above. No hemorrhage. Extensive cytotoxic edema. Effacement of portions of the lateral ventricle on the right noted. No midline shift. No acute infarct to the left of midline. Elsewhere there is mild atrophy with mild periventricular small vessel disease on the left. Suspect acute thrombus in the distal most aspect of the right internal auditory canal and extending into the right middle cerebral artery. There are areas of aortic vascular calcification. There are areas of paranasal sinus disease. Electronically Signed: By: Lowella Grip III M.D. On: 08/16/2016 09:55   Ct Angio Chest Pe W And/or Wo Contrast  Result Date: 08/15/2016 CLINICAL DATA:  Central non radiating chest pain. Shortness of breath. Recent admission to hospital for same. Productive cough. EXAM: CT ANGIOGRAPHY CHEST WITH CONTRAST TECHNIQUE: Multidetector CT imaging of the chest was performed using the standard protocol during bolus administration of intravenous contrast. Multiplanar CT image reconstructions and MIPs were obtained to evaluate the vascular anatomy. CONTRAST:  100 mL Isovue 370 COMPARISON:  08/04/2016 FINDINGS: Cardiovascular: Persistent pulmonary emboli are demonstrated in the right upper lung, right lower lung, and left lower lung segmental and subsegmental pulmonary arteries. There is mild improvement since the previous study with partial interval recanalization demonstrated vertically in the left. There is no  definite evidence of any progression. The RV to LV ratio remains elevated at 1.4, improved since previous study. This could indicate persistence of right heart strain. Mild cardiac enlargement. Small pericardial effusion. Normal caliber thoracic aorta with scattered calcifications. Mediastinum/Nodes: Scattered lymph nodes are not pathologically enlarged. Esophagus is decompressed. Thyroid gland is unremarkable. Lungs/Pleura: Evaluation is limited due to motion artifact. There is atelectasis in the lung bases. No discrete consolidation. No pleural effusions. No pneumothorax. Airways are grossly clear. Upper Abdomen: Multiple  liver lesions are demonstrated, largest measuring about 4 cm diameter in the lateral segment left lobe. These were better seen on previous contrast-enhanced CT abdomen and pelvis from 08/06/2016 and likely represent metastatic disease. The pancreatic lesion and pancreatic ductal dilatation seen on previous CT are visualized but less well defined without contrast. Changes are suspicious for pancreatic carcinoma and liver metastasis. There is a left adrenal gland nodule measuring about 12 mm in diameter. Musculoskeletal: No chest wall abnormality. No acute or significant osseous findings. Review of the MIP images confirms the above findings. IMPRESSION: Persistent bilateral pulmonary emboli, demonstrating improvement since the previous study. This suggests response to interval therapy. No progression of pulmonary emboli. RV to LV ratio remains elevated at 1.4 although improved since previous study. This could indicate persistence of right heart strain. Mass lesion in the body of the pancreas with pancreatic ductal dilatation and multiple hepatic metastases are again demonstrated but less well defined than on the previous contrast-enhanced CT abdomen and pelvis. Lung nodules seen previously are not well defined on today's study due to motion artifact and atelectasis. Electronically Signed   By:  Lucienne Capers M.D.   On: 08/15/2016 22:40   Ct Angio Chest Pe W And/or Wo Contrast  Addendum Date: 08/04/2016   ADDENDUM REPORT: 08/04/2016 15:56 ADDENDUM: Additional clinical information, remote history of prostate cancer. Upon re-review of images, multiple vague low-attenuation lesions in the liver suspicious for metastatic lesions. Dedicated CT abdomen pelvis with contrast would better evaluate the liver lesions. Electronically Signed   By: Donavan Foil M.D.   On: 08/04/2016 15:56   Result Date: 08/04/2016 CLINICAL DATA:  Shortness of breath and chest pain EXAM: CT ANGIOGRAPHY CHEST WITH CONTRAST TECHNIQUE: Multidetector CT imaging of the chest was performed using the standard protocol during bolus administration of intravenous contrast. Multiplanar CT image reconstructions and MIPs were obtained to evaluate the vascular anatomy. CONTRAST:  100 mL Isovue 370 intravenous COMPARISON:  Chest x-ray 08/04/2016, CT chest 04/12/2012 FINDINGS: Cardiovascular: Satisfactory opacification of the pulmonary arteries to the segmental level. Extensive filling defect within right lower lobe lobar, segmental, and subsegmental arteries. Additional filling defects within segmental and subsegmental branches of the right upper and middle lobe arterial branches. Filling defect also present within left upper lobe lobar, segmental and subsegmental branches with additional small filling defects present within segmental and subsegmental left lower lobe arterial branches. RV LV ratio elevated at 3.2. Small pericardial effusion. Mildly ectatic ascending aorta. Bovine arch variant. Atherosclerotic calcifications. Mediastinum/Nodes: Trachea is midline. Thyroid grossly unremarkable. Scattered subcentimeter lymph nodes in the mediastinum, grossly unchanged, for example precarinal lymph node measures 1 cm. Ill-defined low density within the bilateral hilar regions, uncertain if this is all due to thrombus. The esophagus is grossly  unremarkable. Lungs/Pleura: Mild hazy density in the perihilar regions. No consolidation, effusion or pneumothorax. Within the anterior right lung base, there is a 6 x 4 mm oval nodule, 5 mm average diameter, series 6, image number 100. Upper Abdomen: No acute abnormality in the upper abdomen, possible mildly nodular contour of the liver. Mild gynecomastia. Musculoskeletal: Degenerative changes of the spine. No acute or suspicious bone lesions. Review of the MIP images confirms the above findings. IMPRESSION: 1. The study is positive for acute bilateral pulmonary emboli, extensive on the right side. Positive for acute PE with CT evidence of right heart strain (RV/LV Ratio = 3.2) consistent with at least submassive (intermediate risk) PE. The presence of right heart strain has been associated with an increased risk  of morbidity and mortality. Please activate Code PE by paging [phone]. 2. Ill-defined soft tissue density within the bilateral hilar regions, uncertain if this is all secondary to thrombus, suggest short interval CT follow-up to exclude hilar adenopathy or possible developing soft tissue density in the left greater than right hila. 3. 5 mm pulmonary nodule in the anterior right lung base. No follow-up needed if patient is low-risk. Non-contrast chest CT can be considered in 12 months if patient is high-risk. This recommendation follows the consensus statement: Guidelines for Management of Incidental Pulmonary Nodules Detected on CT Images: From the Fleischner Society 2017; Radiology 2017; 284:228-243. 4. Mild hazy attenuation within the bilateral perihilar regions could relate to mild edema. Critical Value/emergent results were called by telephone at the time of interpretation on 08/04/2016 at 2:55 pm to Dr. Deno Etienne , who verbally acknowledged these results. Electronically Signed: By: Donavan Foil M.D. On: 08/04/2016 14:55   Mr Jodene Nam Neck W Wo Contrast  Result Date: 08/17/2016 CLINICAL DATA:   Hypoxia.  Acute infarct seen on CT. EXAM: MR HEAD WITHOUT CONTRAST MR CIRCLE OF WILLIS WITHOUT CONTRAST MRA OF THE NECK WITHOUT AND WITH CONTRAST TECHNIQUE: Multiplanar, multiecho pulse sequences of the brain, circle of willis and surrounding structures were obtained without intravenous contrast. Angiographic images of the neck were obtained using MRA technique without and with intravenous contrast. CONTRAST:  59mL MULTIHANCE GADOBENATE DIMEGLUMINE 529 MG/ML IV SOLN COMPARISON:  Head CT 08/16/2016 FINDINGS: MR HEAD FINDINGS Brain: There is diffusion restriction throughout the entire right anterior and middle cerebral artery territories. Additionally, there are scattered foci of acute ischemia within the left frontal white matter and both occipital lobes. There is cytotoxic edema with resultant hyperintense T2 weighted signal throughout the right ACA and MCA distributions. There is no midline shift. There is mild narrowing of the right lateral ventricle. No hydrocephalus or extra-axial fluid collection. The midline structures are normal. No age advanced or lobar predominant atrophy. No hemorrhage. Vascular: There is loss of the normal right middle and anterior cerebral artery flow voids. There are scattered foci of hyperintense T2 weighted signal within the subcortical and deep white matter of the left hemisphere, compatible with chronic microvascular ischemia. No evidence of chronic microhemorrhage or amyloid angiopathy. Skull and upper cervical spine: The visualized skull base, calvarium, upper cervical spine and extracranial soft tissues are normal. Sinuses/Orbits: No fluid levels or advanced mucosal thickening. No mastoid effusion. Normal orbits. MR CIRCLE OF WILLIS FINDINGS Intracranial internal carotid arteries: There is diminished flow related enhancement within the supraclinoid right internal carotid artery. The left ICA is normal. Anterior cerebral arteries: There is complete loss of flow related enhancement  of the right anterior cerebral artery A1 segment. There is a diminutive A2 segments supplied by the anterior communicating artery. The left anterior cerebral artery is normal. Middle cerebral arteries: The right middle cerebral artery is occluded at its origin and no flow related enhancement is seen within its distal vascular territory. The left middle cerebral artery is normal. Posterior communicating arteries: Present on the right. Posterior cerebral arteries: Normal. Basilar artery: Normal. Vertebral arteries: Left dominant. Normal. Superior cerebellar arteries: Normal. Anterior inferior cerebellar arteries: Normal. Posterior inferior cerebellar arteries: Left PICA is not visualized. The right PICA is normal. MRA NECK FINDINGS There is a normal variant aortic arch branching pattern with the brachiocephalic and left common carotid arteries sharing a common origin. On the time-of-flight images of the neck, there is loss of flow related enhancement within the right internal carotid  artery at the level of the oropharynx. On the contrast-enhanced MRA images, however, there is contrast enhancement of the segments of the right internal carotid artery. The left common and internal carotid arteries are normal. Both vertebral arteries arise from their respective subclavian arteries without origin stenosis. The vertebral system is mildly left dominant. The cervical segments of the vertebral arteries are normal. Limited imaging of the soft tissues of the neck is unremarkable. IMPRESSION: 1. 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 No midline shift or acute hemorrhage. 2. Multiple scattered small (3-5 mm) foci of acute ischemia within the left frontal white matter and both occipital lobes. Bilateral distribution suggests a central cardioembolic source. 3. 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 The right ACA A2  segment is at least partially supplied via the anterior communicating artery. 4. Time of flight images of the neck show loss of normal flow-related enhancement in the mid-to-distal right ICA; however, the post-contrast images show normal enhancement. Taken together, this probably indicates slow flow within this segment of the right ICA. 5. MRA of the neck is otherwise normal. Electronically Signed   By: Ulyses Jarred M.D.   On: 08/17/2016 00:21   Mr Brain Wo Contrast  Result Date: 08/17/2016 CLINICAL DATA:  Hypoxia.  Acute infarct seen on CT. EXAM: MR HEAD WITHOUT CONTRAST MR CIRCLE OF WILLIS WITHOUT CONTRAST MRA OF THE NECK WITHOUT AND WITH CONTRAST TECHNIQUE: Multiplanar, multiecho pulse sequences of the brain, circle of willis and surrounding structures were obtained without intravenous contrast. Angiographic images of the neck were obtained using MRA technique without and with intravenous contrast. CONTRAST:  41mL MULTIHANCE GADOBENATE DIMEGLUMINE 529 MG/ML IV SOLN COMPARISON:  Head CT 08/16/2016 FINDINGS: MR HEAD FINDINGS Brain: There is diffusion restriction throughout the entire right anterior and middle cerebral artery territories. Additionally, there are scattered foci of acute ischemia within the left frontal white matter and both occipital lobes. There is cytotoxic edema with resultant hyperintense T2 weighted signal throughout the right ACA and MCA distributions. There is no midline shift. There is mild narrowing of the right lateral ventricle. No hydrocephalus or extra-axial fluid collection. The midline structures are normal. No age advanced or lobar predominant atrophy. No hemorrhage. Vascular: There is loss of the normal right middle and anterior cerebral artery flow voids. There are scattered foci of hyperintense T2 weighted signal within the subcortical and deep white matter of the left hemisphere, compatible with chronic microvascular ischemia. No evidence of chronic microhemorrhage or amyloid  angiopathy. Skull and upper cervical spine: The visualized skull base, calvarium, upper cervical spine and extracranial soft tissues are normal. Sinuses/Orbits: No fluid levels or advanced mucosal thickening. No mastoid effusion. Normal orbits. MR CIRCLE OF WILLIS FINDINGS Intracranial internal carotid arteries: There is diminished flow related enhancement within the supraclinoid right internal carotid artery. The left ICA is normal. Anterior cerebral arteries: There is complete loss of flow related enhancement of the right anterior cerebral artery A1 segment. There is a diminutive A2 segments supplied by the anterior communicating artery. The left anterior cerebral artery is normal. Middle cerebral arteries: The right middle cerebral artery is occluded at its origin and no flow related enhancement is seen within its distal vascular territory. The left middle cerebral artery is normal. Posterior communicating arteries: Present on the right. Posterior cerebral arteries: Normal. Basilar artery: Normal. Vertebral arteries: Left dominant. Normal. Superior cerebellar arteries: Normal. Anterior inferior cerebellar arteries: Normal. Posterior inferior  cerebellar arteries: Left PICA is not visualized. The right PICA is normal. MRA NECK FINDINGS There is a normal variant aortic arch branching pattern with the brachiocephalic and left common carotid arteries sharing a common origin. On the time-of-flight images of the neck, there is loss of flow related enhancement within the right internal carotid artery at the level of the oropharynx. On the contrast-enhanced MRA images, however, there is contrast enhancement of the segments of the right internal carotid artery. The left common and internal carotid arteries are normal. Both vertebral arteries arise from their respective subclavian arteries without origin stenosis. The vertebral system is mildly left dominant. The cervical segments of the vertebral arteries are normal.  Limited imaging of the soft tissues of the neck is unremarkable. IMPRESSION: 1. 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 No midline shift or acute hemorrhage. 2. Multiple scattered small (3-5 mm) foci of acute ischemia within the left frontal white matter and both occipital lobes. Bilateral distribution suggests a central cardioembolic source. 3. 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 The right ACA A2 segment is at least partially supplied via the anterior communicating artery. 4. Time of flight images of the neck show loss of normal flow-related enhancement in the mid-to-distal right ICA; however, the post-contrast images show normal enhancement. Taken together, this probably indicates slow flow within this segment of the right ICA. 5. MRA of the neck is otherwise normal. Electronically Signed   By: Ulyses Jarred M.D.   On: 08/17/2016 00:21   Ct Abdomen Pelvis W Contrast  Result Date: 08/07/2016 CLINICAL DATA:  Prostate cancer EXAM: CT ABDOMEN AND PELVIS WITH CONTRAST TECHNIQUE: Multidetector CT imaging of the abdomen and pelvis was performed using the standard protocol following bolus administration of intravenous contrast. CONTRAST:  166mL ISOVUE-300 IOPAMIDOL (ISOVUE-300) INJECTION 61% COMPARISON:  10/26/2014 CT FINDINGS: Lower chest: Stable cardiomegaly. Small posterior pericardial effusion measuring up to 12 mm in thickness. Trace bilateral pleural effusions minimal bibasilar atelectasis. Tiny 4 mm lingular nodular density, series 3, image 2. Hepatobiliary: Numerous hypodense peripherally enhancing hepatic masses consistent with metastatic disease throughout the right and left lobes and caudate. These range in size from 0.5 cm through 4.6 cm This is a new finding since prior CT. No biliary dilatation. Gallbladder is contracted without stones. Pancreas: 2.3 x 1.6 cm subtle masslike abnormality  of the pancreatic body (series 3, image 32) with pancreatic ductal dilatation measuring up to 7 mm leading up to this lesion. The pancreatic head and uncinate process are grossly unremarkable. Spleen: Normal Adrenals/Urinary Tract: Stable bilateral adrenal nodules on the right measuring 2.5 x 1.3 cm and on the left, 2.1 x 1.3 cm. These were present on prior study and unchanged in appearance and may reflect adenomas. These precede the pancreatic and hepatic findings. Stomach/Bowel: The stomach is contracted. No small or large bowel dilatation or inflammation is identified. Appendix may be surgically absent. There is a surgical clip at the cecum. Vascular/Lymphatic: Enlarged porta hepatis lymph node measuring 2 x 0.9 cm. Small lymph node measuring 1.2 cm adjacent to the tail of the pancreas. No thrombosis noted of the splenic, superior mesenteric nor portal vein. Reproductive: Prostate is unremarkable. Other: No abdominal wall hernia or abnormality. No abdominopelvic ascites. Musculoskeletal: Chronic remote compressions of L4 and L5 unchanged in appearance. No lytic or blastic disease. IMPRESSION: 1. Suspicious 2.3 x 1.6 cm masslike abnormality in the pancreatic body causing pancreatic ductal dilatation  up to 7 mm in caliber leading up to this lesion. Adjacent porta hepatis and peripancreatic lymph nodes consistent with metastatic lymphadenopathy. 2. Numerous large hypodense enhancing metastatic lesions of the liver. These are likely amenable for percutaneous sampling if indicated. 3. Chronic stable bilateral adrenal nodules since 2016. Given their stability, these likely reflect benign findings such as adenomas as opposed to metastasis. 4. Small posterior pericardial effusion measuring up to 12 mm in thickness. 5. Tiny 4 mm lingular nodule. 6. Chronic mild compressions of L4 and L5.  Osteoblastic disease. Electronically Signed   By: Ashley Royalty M.D.   On: 08/07/2016 00:47   US Biopsy  Result Date:  08/10/2016 CLINICAL DATA:  Multiple hepatic masses and pancreatic mass. The patient now presents for liver biopsy to try to establish a tissue diagnosis. EXAM: ULTRASOUND GUIDED CORE BIOPSY OF LIVER MEDICATIONS: 1.0 mg IV Versed; 50 mcg IV Fentanyl Total Moderate Sedation Time: 11 minutes. The patient's level of consciousness and physiologic status were continuously monitored during the procedure by Radiology nursing. PROCEDURE: The procedure, risks, benefits, and alternatives were explained to the patient. Questions regarding the procedure were encouraged and answered. The patient understands and consents to the procedure. A time out was performed prior to initiating the procedure. The abdominal wall was prepped with chlorhexidine in a sterile fashion, and a sterile drape was applied covering the operative field. A sterile gown and sterile gloves were used for the procedure. Local anesthesia was provided with 1% Lidocaine. Ultrasound was used to localize liver lesions. Left lobe liver mass was chosen for sampling. Under ultrasound guidance, a 17 gauge needle was advanced into the left lobe of the liver. A total of 3 coaxial 18 gauge core biopsy samples were obtained and submitted in formalin. After needle removal post biopsy ultrasound was performed. COMPLICATIONS: None. FINDINGS: Multiple rounded hypoechoic masses are seen throughout the liver parenchyma. These are most visible in the left lobe of the liver by ultrasound. Tissue sampling demonstrated solid core biopsy samples. No evidence of immediate bleeding complication. IMPRESSION: Ultrasound-guided core biopsy performed of a lesion within the left lobe of the liver. Electronically Signed   By: Aletta Edouard M.D.   On: 08/10/2016 11:12   Dg Chest Port 1 View  Result Date: 08/15/2016 CLINICAL DATA:  Central non radiating chest pain. Shortness of breath. Patient was diagnosed with pulmonary embolus on 08/04/2016. EXAM: PORTABLE CHEST 1 VIEW COMPARISON:  CT  of the chest 08/04/2016 FINDINGS: The cardiac silhouette is mildly enlarged. There is no evidence of focal airspace consolidation, pleural effusion or pneumothorax. Low lung volumes. Osseous structures are without acute abnormality. Soft tissues are grossly normal. IMPRESSION: No radiographic evidence of acute cardiopulmonary disease. Electronically Signed   By: Fidela Salisbury M.D.   On: 08/15/2016 20:30   Dg Chest Port 1 View  Result Date: 08/04/2016 CLINICAL DATA:  Chest pain, shortness of Breath EXAM: PORTABLE CHEST 1 VIEW COMPARISON:  10/25/2014 FINDINGS: Cardiomediastinal silhouette is stable. No infiltrate or pulmonary edema. Stable atelectasis or scarring in lingula. Mild elevation of the right hemidiaphragm again noted. IMPRESSION: No active disease. Stable atelectasis or scarring in lingula. Again noted mild elevation of the right hemidiaphragm. Electronically Signed   By: Lahoma Crocker M.D.   On: 08/04/2016 13:18   Mr Jodene Nam Headm  Result Date: 08/17/2016 CLINICAL DATA:  Hypoxia.  Acute infarct seen on CT. EXAM: MR HEAD WITHOUT CONTRAST MR CIRCLE OF WILLIS WITHOUT CONTRAST MRA OF THE NECK WITHOUT AND WITH CONTRAST TECHNIQUE: Multiplanar, multiecho  pulse sequences of the brain, circle of willis and surrounding structures were obtained without intravenous contrast. Angiographic images of the neck were obtained using MRA technique without and with intravenous contrast. CONTRAST:  60mL MULTIHANCE GADOBENATE DIMEGLUMINE 529 MG/ML IV SOLN COMPARISON:  Head CT 08/16/2016 FINDINGS: MR HEAD FINDINGS Brain: There is diffusion restriction throughout the entire right anterior and middle cerebral artery territories. Additionally, there are scattered foci of acute ischemia within the left frontal white matter and both occipital lobes. There is cytotoxic edema with resultant hyperintense T2 weighted signal throughout the right ACA and MCA distributions. There is no midline shift. There is mild narrowing of the  right lateral ventricle. No hydrocephalus or extra-axial fluid collection. The midline structures are normal. No age advanced or lobar predominant atrophy. No hemorrhage. Vascular: There is loss of the normal right middle and anterior cerebral artery flow voids. There are scattered foci of hyperintense T2 weighted signal within the subcortical and deep white matter of the left hemisphere, compatible with chronic microvascular ischemia. No evidence of chronic microhemorrhage or amyloid angiopathy. Skull and upper cervical spine: The visualized skull base, calvarium, upper cervical spine and extracranial soft tissues are normal. Sinuses/Orbits: No fluid levels or advanced mucosal thickening. No mastoid effusion. Normal orbits. MR CIRCLE OF WILLIS FINDINGS Intracranial internal carotid arteries: There is diminished flow related enhancement within the supraclinoid right internal carotid artery. The left ICA is normal. Anterior cerebral arteries: There is complete loss of flow related enhancement of the right anterior cerebral artery A1 segment. There is a diminutive A2 segments supplied by the anterior communicating artery. The left anterior cerebral artery is normal. Middle cerebral arteries: The right middle cerebral artery is occluded at its origin and no flow related enhancement is seen within its distal vascular territory. The left middle cerebral artery is normal. Posterior communicating arteries: Present on the right. Posterior cerebral arteries: Normal. Basilar artery: Normal. Vertebral arteries: Left dominant. Normal. Superior cerebellar arteries: Normal. Anterior inferior cerebellar arteries: Normal. Posterior inferior cerebellar arteries: Left PICA is not visualized. The right PICA is normal. MRA NECK FINDINGS There is a normal variant aortic arch branching pattern with the brachiocephalic and left common carotid arteries sharing a common origin. On the time-of-flight images of the neck, there is loss of flow  related enhancement within the right internal carotid artery at the level of the oropharynx. On the contrast-enhanced MRA images, however, there is contrast enhancement of the segments of the right internal carotid artery. The left common and internal carotid arteries are normal. Both vertebral arteries arise from their respective subclavian arteries without origin stenosis. The vertebral system is mildly left dominant. The cervical segments of the vertebral arteries are normal. Limited imaging of the soft tissues of the neck is unremarkable. IMPRESSION: 1. 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 No midline shift or acute hemorrhage. 2. Multiple scattered small (3-5 mm) foci of acute ischemia within the left frontal white matter and both occipital lobes. Bilateral distribution suggests a central cardioembolic source. 3. 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 The right ACA A2 segment is at least partially supplied via the anterior communicating artery. 4. Time of flight images of the neck show loss of normal flow-related enhancement in the mid-to-distal right ICA; however, the post-contrast images show normal enhancement. Taken together, this probably indicates slow flow within this segment of the right ICA. 5. MRA of the neck is otherwise normal. Electronically  Signed   By: Ulyses Jarred M.D.   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  At least 3 sons and a family friend and his wife are present I discussed poor prognosis given major co-morbidities His older son, [name] does not appear to be able to accept the concept of palliative care/hospice He needs more time. I have given him my contact information I have discussed this with primary service. I recommend palliative care consult and consideration to transition his care to comfort measures only  For now, family members and not comfortable to discuss CODE STATUS  Please call if questions arise  All questions were answered. The patient knows to call the clinic with any problems, questions or concerns.    Heath Lark, MD 08/17/2016 5:04 PM

## 2016-08-17 NOTE — Progress Notes (Addendum)
PROGRESS NOTE    Jared Clements  JSH:[id number] DOB: [birth year] DOA: 08/15/2016 PCP: Harvie Junior, MD     Brief Narrative:  Jared Clements is a 71 y.o. male with medical history significant for prostate cancer status post resection, pancreatic and liver masses status post recent biopsy, and recent diagnosis of acute saddle PE with heart strain, discharge from the hospital on 08/10/2016 with Eliquis and with follow-up with surgery regarding the biopsy. Patient reports that he continued to have a cough and dyspnea at home and describes his chest pain as slowly improving. He has been dyspneic with minimal exertion, but feels that this is fairly stable from time of recent discharge. He denies fevers or chills and denies leg swelling or orthopnea. He reports continued adherence with the anticoagulant. Home health nurse visited him this afternoon, noted him to be hypoxic, and directed him to the emergency department for further evaluation. He was admitted due to acute hypoxemic respiratory failure, due to recent PE.   On 3/19, patient was noted to be somnolent and obtunded. Stat CT of the head revealed a large right-sided stroke. Stroke team was consulted. He was out of the window for tPA. He continues to be somnolent, although does awaken to voice and follows simple commands.  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 Has been adherent to Eliquis. Repeat CTA chest  with improvement in PE and RV-to-LV ratio. Did not meet home O2 requirement at time of previous discharge. Returned to hospital with chief complaint of dyspnea.  -Continue [place] O2  Acute encephalopathy likely due to large right ACA and MCA stroke, multiple small foci of acute ischemic left frontal and bilateral occipital lobes -Bilateral distribution suggesting cardioembolic source -No previous hx of A Fib, currently sinus rhythm  -Stroke team following -SLP: NPO  -PT/OT -Echo pending   PE with heart strain -Holding eliquis due to NPO. Continue heparin gtt   Metastatic pancreatic cancer -Pancreatic mass noted on CT during recent admission, as well as numerous liver masses concerning for mets. US-guided biopsy of liver lesion performed on 08/09/16, which showed adenocarcinoma with morphology consistent with metastatic pancreatic adenocarcinoma  -Dr. Alvy Bimler to see inpatient  -Palliative care consulted for goals of care   SIRS -Without evidence of infection. Patient is tachycardic, tachypneic, with leukocytosis, but afebrile. CTA chest without pneumonia, influenza screen negative, UA without infection  -Blood cultures pending -Sputum culture pending  -Started on levaquin empirically but will hold for now without gross evidence of infection. Could be secondary to cancer and above illnesses as outlined above  -IVF   Elevated troponin -Due to PE and demand ischemia, previously admission with peak of troponin at 9.7 -Had pleuritic chest pain prior to admission which has been improving. No further chest pain or pressure per son's report  -Trended down 0.89 --> 0.71   HTN -Holding Norvasc due to NPO   Chronic normocytic anemia -Stable, baseline Hgb around 9-10   Hypokalemia -Replace and trend  Goals of care -Discussed with son yesterday and with multiple family members in room today. He has metastatic pancreatic cancer that is newly diagnosed. He also suffered from a PE and now a large  stroke. He remains minimally verbal, follows some commands, not safe to eat. Discussed with them that his prognosis is poor. Unless he makes improvements in swallowing, nutrition, activity, he will likely not be a candidate for treatment measures for his metastatic pancreatic cancer, which carries a poor prognosis in any case. It is unclear that they understand or grasp the gravity of patient's condition. They tell me that they will continue to pray and hope that he can get a feeding tube soon for nutrition. Feeding tube was also discussed that it likely may not give any quality of life for patient. Palliative care consulted.   DVT prophylaxis: Heparin Code Status: Full. Discussed with son this morning. He is Scientist, research (medical). Currently full code. Unsure if family understands gravity of patient's condition and prognosis  Family Communication: multiple family members at bedside Disposition Plan: pending further stabilization, very poor prognosis    Consultants:   Palliative care   Oncology to follow as outpatient   Neurology   Procedures:   None  Antimicrobials:   Levaquin stopped    Subjective: Patient more awake today than my previous exam yesterday. He opens eyes to voice, but closes them quickly. He does not stay awake. He is able to follow commands to move his right hand, but remains flaccid on left upper extremity. He does not move his legs to command, but later is seen moving his feet spontaneously. Family says that he was able to call out their names prior to my exam, but he remains nonverbal on my exam.    Objective: Vitals:   08/17/16 0043 08/17/16 0245 08/17/16 0445 08/17/16 0647  BP: (!) 152/84 (!) 166/80 (!) 157/80   Pulse: 100 (!) 101 (!) 102   Resp: 20 (!) 22 20   Temp: 98.8 F (37.1 C) 99.2 F (37.3 C) 98.7 F (37.1 C)   TempSrc: Axillary Axillary Axillary   SpO2: 98% 100% 100% 99%  Weight: 70.1 kg (154 lb 8 oz)     Height: 6\' 2"  (1.88 m)        Intake/Output Summary (Last 24 hours) at 08/17/16 0953 Last data filed at 08/17/16 0640  Gross per 24 hour  Intake              768 ml  Output             1150 ml  Net             -382 ml   Filed Weights   08/15/16 2010 08/16/16 0123 08/17/16 0043  Weight: 72.6 kg (160 lb) 69.7 kg (153 lb 9.6 oz) 70.1 kg (154 lb 8 oz)    Examination:  General exam: Ill appearing  ENT: poor dentition  Respiratory system: Clear to auscultation. Respiratory effort normal on [place] O2  Cardiovascular system: S1 & S2 heard, Tachycardic, regular rhythm. No JVD, murmurs, rubs, gallops or clicks. No pedal edema. Gastrointestinal system: Abdomen is nondistended, soft and nontender. No organomegaly or masses felt. Normal bowel sounds heard. Central nervous system: Moves right arm to command, left arm is flaccid, moves legs spontaneously  Extremities: Symmetric  Skin: No rashes, lesions or ulcers on visible skin   Data Reviewed: I have personally reviewed following labs and imaging studies  CBC:  Recent Labs Lab 08/15/16 2010 08/16/16  1607 08/16/16 0845 08/17/16 0222  WBC 19.9* 17.1* 18.5* 18.7*  NEUTROABS  --  12.9*  --   --   HGB 10.7* 9.3* 9.8* 9.5*  HCT 32.4* 28.8* 30.3* 29.1*  MCV 83.5 83.2 83.2 82.9  PLT 203 191 181 371   Basic Metabolic Panel:  Recent Labs Lab 08/15/16 2010 08/16/16 0845 08/17/16 0222  NA 133* 133* 133*  K 3.6 4.1 3.4*  CL 98* 104 101  CO2 20* 20* 22  GLUCOSE 108* 132* 108*  BUN 9 6 7   CREATININE 0.84 0.65 0.72  CALCIUM 8.4* 8.3* 8.3*  MG  --  1.7  --   PHOS  --  3.2  --    GFR: Estimated Creatinine Clearance: 81.5 mL/min (by C-G formula based on SCr of 0.72 mg/dL). Liver Function Tests:  Recent Labs Lab 08/15/16 2010 08/16/16 0845  AST 59* 55*  ALT 38 38  ALKPHOS 133* 136*  BILITOT 1.2 1.3*  PROT 6.6 7.4  ALBUMIN 1.9* 2.1*    Recent Labs Lab 08/15/16 2010  LIPASE 117*   No results for input(s): AMMONIA in the last 168 hours. Coagulation  Profile:  Recent Labs Lab 08/16/16 0845  INR 2.08   Cardiac Enzymes:  Recent Labs Lab 08/16/16 0224 08/16/16 0859  TROPONINI 0.89* 0.71*   BNP (last 3 results) No results for input(s): PROBNP in the last 8760 hours. HbA1C: No results for input(s): HGBA1C in the last 72 hours. CBG:  Recent Labs Lab 08/10/16 1140 08/16/16 0827 08/17/16 0632  GLUCAP 142* 128* 108*   Lipid Profile: No results for input(s): CHOL, HDL, LDLCALC, TRIG, CHOLHDL, LDLDIRECT in the last 72 hours. Thyroid Function Tests: No results for input(s): TSH, T4TOTAL, FREET4, T3FREE, THYROIDAB in the last 72 hours. Anemia Panel: No results for input(s): VITAMINB12, FOLATE, FERRITIN, TIBC, IRON, RETICCTPCT in the last 72 hours. Sepsis Labs:  Recent Labs Lab 08/16/16 0859  LATICACIDVEN 1.4    No results found for this or any previous visit (from the past 240 hour(s)).     Radiology Studies: Ct Head Wo Contrast  Addendum Date: 08/16/2016   ADDENDUM REPORT: 08/16/2016 10:14 ADDENDUM: Critical Value/emergent results were called by telephone at the time of interpretation on 08/16/2016 at 10:14 am to Dr. Dessa Phi , who verbally acknowledged these results. Electronically Signed   By: Lowella Grip III M.D.   On: 08/16/2016 10:14   Result Date: 08/16/2016 CLINICAL DATA:  Altered mental status with inability to arouse EXAM: CT HEAD WITHOUT CONTRAST TECHNIQUE: Contiguous axial images were obtained from the base of the skull through the vertex without intravenous contrast. COMPARISON:  Oct 25, 2008 FINDINGS: Brain: There is underlying mild diffuse atrophy. There is cytotoxic edema throughout the right frontal lobe, throughout all but the posterior most aspect the right temporal lobe, the mid and anterior right occipital lobe, and anterior to mid right parietal lobe. There is decreased attenuation throughout most of the right basal ganglia region. There is sparing of the right thalamus. There is subtle  effacement of portions of the right lateral ventricle. There is no midline shift, however. There is no evidence of hemorrhage. There is no extra-axial fluid collection. Mild periventricular small vessel disease is noted to the left of midline. Vascular: There is hyperdensity in the proximal right middle cerebral artery as well as in the distal most aspect of the right common carotid artery. Calcification is noted in each carotid siphon region. Skull: The bony calvarium appears intact. Sinuses/Orbits: There is mucosal thickening  in multiple ethmoid air cells bilaterally. Frontal sinuses are hypoplastic. There is slight mucosal thickening in each maxillary antrum. Other visualized paranasal sinuses are clear. Orbits appear symmetric bilaterally. Other: Mastoid air cells are clear. IMPRESSION: Evidence of acute infarct involving the anterior and middle cerebral arteries on the right with extensive brain parenchymal involvement on the right as summarized above. No hemorrhage. Extensive cytotoxic edema. Effacement of portions of the lateral ventricle on the right noted. No midline shift. No acute infarct to the left of midline. Elsewhere there is mild atrophy with mild periventricular small vessel disease on the left. Suspect acute thrombus in the distal most aspect of the right internal auditory canal and extending into the right middle cerebral artery. There are areas of aortic vascular calcification. There are areas of paranasal sinus disease. Electronically Signed: By: Lowella Grip III M.D. On: 08/16/2016 09:55   Ct Angio Chest Pe W And/or Wo Contrast  Result Date: 08/15/2016 CLINICAL DATA:  Central non radiating chest pain. Shortness of breath. Recent admission to hospital for same. Productive cough. EXAM: CT ANGIOGRAPHY CHEST WITH CONTRAST TECHNIQUE: Multidetector CT imaging of the chest was performed using the standard protocol during bolus administration of intravenous contrast. Multiplanar CT image  reconstructions and MIPs were obtained to evaluate the vascular anatomy. CONTRAST:  100 mL Isovue 370 COMPARISON:  08/04/2016 FINDINGS: Cardiovascular: Persistent pulmonary emboli are demonstrated in the right upper lung, right lower lung, and left lower lung segmental and subsegmental pulmonary arteries. There is mild improvement since the previous study with partial interval recanalization demonstrated vertically in the left. There is no definite evidence of any progression. The RV to LV ratio remains elevated at 1.4, improved since previous study. This could indicate persistence of right heart strain. Mild cardiac enlargement. Small pericardial effusion. Normal caliber thoracic aorta with scattered calcifications. Mediastinum/Nodes: Scattered lymph nodes are not pathologically enlarged. Esophagus is decompressed. Thyroid gland is unremarkable. Lungs/Pleura: Evaluation is limited due to motion artifact. There is atelectasis in the lung bases. No discrete consolidation. No pleural effusions. No pneumothorax. Airways are grossly clear. Upper Abdomen: Multiple liver lesions are demonstrated, largest measuring about 4 cm diameter in the lateral segment left lobe. These were better seen on previous contrast-enhanced CT abdomen and pelvis from 08/06/2016 and likely represent metastatic disease. The pancreatic lesion and pancreatic ductal dilatation seen on previous CT are visualized but less well defined without contrast. Changes are suspicious for pancreatic carcinoma and liver metastasis. There is a left adrenal gland nodule measuring about 12 mm in diameter. Musculoskeletal: No chest wall abnormality. No acute or significant osseous findings. Review of the MIP images confirms the above findings. IMPRESSION: Persistent bilateral pulmonary emboli, demonstrating improvement since the previous study. This suggests response to interval therapy. No progression of pulmonary emboli. RV to LV ratio remains elevated at 1.4  although improved since previous study. This could indicate persistence of right heart strain. Mass lesion in the body of the pancreas with pancreatic ductal dilatation and multiple hepatic metastases are again demonstrated but less well defined than on the previous contrast-enhanced CT abdomen and pelvis. Lung nodules seen previously are not well defined on today's study due to motion artifact and atelectasis. Electronically Signed   By: Lucienne Capers M.D.   On: 08/15/2016 22:40   Mr Jodene Nam Neck W Wo Contrast  Result Date: 08/17/2016 CLINICAL DATA:  Hypoxia.  Acute infarct seen on CT. EXAM: MR HEAD WITHOUT CONTRAST MR CIRCLE OF WILLIS WITHOUT CONTRAST MRA OF THE NECK WITHOUT AND WITH  CONTRAST TECHNIQUE: Multiplanar, multiecho pulse sequences of the brain, circle of willis and surrounding structures were obtained without intravenous contrast. Angiographic images of the neck were obtained using MRA technique without and with intravenous contrast. CONTRAST:  8mL MULTIHANCE GADOBENATE DIMEGLUMINE 529 MG/ML IV SOLN COMPARISON:  Head CT 08/16/2016 FINDINGS: MR HEAD FINDINGS Brain: There is diffusion restriction throughout the entire right anterior and middle cerebral artery territories. Additionally, there are scattered foci of acute ischemia within the left frontal white matter and both occipital lobes. There is cytotoxic edema with resultant hyperintense T2 weighted signal throughout the right ACA and MCA distributions. There is no midline shift. There is mild narrowing of the right lateral ventricle. No hydrocephalus or extra-axial fluid collection. The midline structures are normal. No age advanced or lobar predominant atrophy. No hemorrhage. Vascular: There is loss of the normal right middle and anterior cerebral artery flow voids. There are scattered foci of hyperintense T2 weighted signal within the subcortical and deep white matter of the left hemisphere, compatible with chronic microvascular ischemia. No  evidence of chronic microhemorrhage or amyloid angiopathy. Skull and upper cervical spine: The visualized skull base, calvarium, upper cervical spine and extracranial soft tissues are normal. Sinuses/Orbits: No fluid levels or advanced mucosal thickening. No mastoid effusion. Normal orbits. MR CIRCLE OF WILLIS FINDINGS Intracranial internal carotid arteries: There is diminished flow related enhancement within the supraclinoid right internal carotid artery. The left ICA is normal. Anterior cerebral arteries: There is complete loss of flow related enhancement of the right anterior cerebral artery A1 segment. There is a diminutive A2 segments supplied by the anterior communicating artery. The left anterior cerebral artery is normal. Middle cerebral arteries: The right middle cerebral artery is occluded at its origin and no flow related enhancement is seen within its distal vascular territory. The left middle cerebral artery is normal. Posterior communicating arteries: Present on the right. Posterior cerebral arteries: Normal. Basilar artery: Normal. Vertebral arteries: Left dominant. Normal. Superior cerebellar arteries: Normal. Anterior inferior cerebellar arteries: Normal. Posterior inferior cerebellar arteries: Left PICA is not visualized. The right PICA is normal. MRA NECK FINDINGS There is a normal variant aortic arch branching pattern with the brachiocephalic and left common carotid arteries sharing a common origin. On the time-of-flight images of the neck, there is loss of flow related enhancement within the right internal carotid artery at the level of the oropharynx. On the contrast-enhanced MRA images, however, there is contrast enhancement of the segments of the right internal carotid artery. The left common and internal carotid arteries are normal. Both vertebral arteries arise from their respective subclavian arteries without origin stenosis. The vertebral system is mildly left dominant. The cervical  segments of the vertebral arteries are normal. Limited imaging of the soft tissues of the neck is unremarkable. IMPRESSION: 1. 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 No midline shift or acute hemorrhage. 2. Multiple scattered small (3-5 mm) foci of acute ischemia within the left frontal white matter and both occipital lobes. Bilateral distribution suggests a central cardioembolic source. 3. 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 The right ACA A2 segment is at least partially supplied via the anterior communicating artery. 4. Time of flight images of the neck show loss of normal flow-related enhancement in the mid-to-distal right ICA; however, the post-contrast images show normal enhancement. Taken together, this probably indicates slow flow within this segment of the right ICA. 5. MRA of the neck  is otherwise normal. Electronically Signed   By: Ulyses Jarred M.D.   On: 08/17/2016 00:21   Mr Brain Wo Contrast  Result Date: 08/17/2016 CLINICAL DATA:  Hypoxia.  Acute infarct seen on CT. EXAM: MR HEAD WITHOUT CONTRAST MR CIRCLE OF WILLIS WITHOUT CONTRAST MRA OF THE NECK WITHOUT AND WITH CONTRAST TECHNIQUE: Multiplanar, multiecho pulse sequences of the brain, circle of willis and surrounding structures were obtained without intravenous contrast. Angiographic images of the neck were obtained using MRA technique without and with intravenous contrast. CONTRAST:  48mL MULTIHANCE GADOBENATE DIMEGLUMINE 529 MG/ML IV SOLN COMPARISON:  Head CT 08/16/2016 FINDINGS: MR HEAD FINDINGS Brain: There is diffusion restriction throughout the entire right anterior and middle cerebral artery territories. Additionally, there are scattered foci of acute ischemia within the left frontal white matter and both occipital lobes. There is cytotoxic edema with resultant hyperintense T2 weighted signal throughout the right ACA and  MCA distributions. There is no midline shift. There is mild narrowing of the right lateral ventricle. No hydrocephalus or extra-axial fluid collection. The midline structures are normal. No age advanced or lobar predominant atrophy. No hemorrhage. Vascular: There is loss of the normal right middle and anterior cerebral artery flow voids. There are scattered foci of hyperintense T2 weighted signal within the subcortical and deep white matter of the left hemisphere, compatible with chronic microvascular ischemia. No evidence of chronic microhemorrhage or amyloid angiopathy. Skull and upper cervical spine: The visualized skull base, calvarium, upper cervical spine and extracranial soft tissues are normal. Sinuses/Orbits: No fluid levels or advanced mucosal thickening. No mastoid effusion. Normal orbits. MR CIRCLE OF WILLIS FINDINGS Intracranial internal carotid arteries: There is diminished flow related enhancement within the supraclinoid right internal carotid artery. The left ICA is normal. Anterior cerebral arteries: There is complete loss of flow related enhancement of the right anterior cerebral artery A1 segment. There is a diminutive A2 segments supplied by the anterior communicating artery. The left anterior cerebral artery is normal. Middle cerebral arteries: The right middle cerebral artery is occluded at its origin and no flow related enhancement is seen within its distal vascular territory. The left middle cerebral artery is normal. Posterior communicating arteries: Present on the right. Posterior cerebral arteries: Normal. Basilar artery: Normal. Vertebral arteries: Left dominant. Normal. Superior cerebellar arteries: Normal. Anterior inferior cerebellar arteries: Normal. Posterior inferior cerebellar arteries: Left PICA is not visualized. The right PICA is normal. MRA NECK FINDINGS There is a normal variant aortic arch branching pattern with the brachiocephalic and left common carotid arteries sharing a  common origin. On the time-of-flight images of the neck, there is loss of flow related enhancement within the right internal carotid artery at the level of the oropharynx. On the contrast-enhanced MRA images, however, there is contrast enhancement of the segments of the right internal carotid artery. The left common and internal carotid arteries are normal. Both vertebral arteries arise from their respective subclavian arteries without origin stenosis. The vertebral system is mildly left dominant. The cervical segments of the vertebral arteries are normal. Limited imaging of the soft tissues of the neck is unremarkable. IMPRESSION: 1. 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 No midline shift or acute hemorrhage. 2. Multiple scattered small (3-5 mm) foci of acute ischemia within the left frontal white matter and both occipital lobes. Bilateral distribution suggests a central cardioembolic source. 3. 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 The right ACA A2 segment  is at least partially supplied via the anterior communicating artery. 4. Time of flight images of the neck show loss of normal flow-related enhancement in the mid-to-distal right ICA; however, the post-contrast images show normal enhancement. Taken together, this probably indicates slow flow within this segment of the right ICA. 5. MRA of the neck is otherwise normal. Electronically Signed   By: Ulyses Jarred M.D.   On: 08/17/2016 00:21   Dg Chest Port 1 View  Result Date: 08/15/2016 CLINICAL DATA:  Central non radiating chest pain. Shortness of breath. Patient was diagnosed with pulmonary embolus on 08/04/2016. EXAM: PORTABLE CHEST 1 VIEW COMPARISON:  CT of the chest 08/04/2016 FINDINGS: The cardiac silhouette is mildly enlarged. There is no evidence of focal airspace consolidation, pleural effusion or pneumothorax. Low lung volumes. Osseous  structures are without acute abnormality. Soft tissues are grossly normal. IMPRESSION: No radiographic evidence of acute cardiopulmonary disease. Electronically Signed   By: Fidela Salisbury M.D.   On: 08/15/2016 20:30   Mr Jodene Nam Headm  Result Date: 08/17/2016 CLINICAL DATA:  Hypoxia.  Acute infarct seen on CT. EXAM: MR HEAD WITHOUT CONTRAST MR CIRCLE OF WILLIS WITHOUT CONTRAST MRA OF THE NECK WITHOUT AND WITH CONTRAST TECHNIQUE: Multiplanar, multiecho pulse sequences of the brain, circle of willis and surrounding structures were obtained without intravenous contrast. Angiographic images of the neck were obtained using MRA technique without and with intravenous contrast. CONTRAST:  69mL MULTIHANCE GADOBENATE DIMEGLUMINE 529 MG/ML IV SOLN COMPARISON:  Head CT 08/16/2016 FINDINGS: MR HEAD FINDINGS Brain: There is diffusion restriction throughout the entire right anterior and middle cerebral artery territories. Additionally, there are scattered foci of acute ischemia within the left frontal white matter and both occipital lobes. There is cytotoxic edema with resultant hyperintense T2 weighted signal throughout the right ACA and MCA distributions. There is no midline shift. There is mild narrowing of the right lateral ventricle. No hydrocephalus or extra-axial fluid collection. The midline structures are normal. No age advanced or lobar predominant atrophy. No hemorrhage. Vascular: There is loss of the normal right middle and anterior cerebral artery flow voids. There are scattered foci of hyperintense T2 weighted signal within the subcortical and deep white matter of the left hemisphere, compatible with chronic microvascular ischemia. No evidence of chronic microhemorrhage or amyloid angiopathy. Skull and upper cervical spine: The visualized skull base, calvarium, upper cervical spine and extracranial soft tissues are normal. Sinuses/Orbits: No fluid levels or advanced mucosal thickening. No mastoid effusion.  Normal orbits. MR CIRCLE OF WILLIS FINDINGS Intracranial internal carotid arteries: There is diminished flow related enhancement within the supraclinoid right internal carotid artery. The left ICA is normal. Anterior cerebral arteries: There is complete loss of flow related enhancement of the right anterior cerebral artery A1 segment. There is a diminutive A2 segments supplied by the anterior communicating artery. The left anterior cerebral artery is normal. Middle cerebral arteries: The right middle cerebral artery is occluded at its origin and no flow related enhancement is seen within its distal vascular territory. The left middle cerebral artery is normal. Posterior communicating arteries: Present on the right. Posterior cerebral arteries: Normal. Basilar artery: Normal. Vertebral arteries: Left dominant. Normal. Superior cerebellar arteries: Normal. Anterior inferior cerebellar arteries: Normal. Posterior inferior cerebellar arteries: Left PICA is not visualized. The right PICA is normal. MRA NECK FINDINGS There is a normal variant aortic arch branching pattern with the brachiocephalic and left common carotid arteries sharing a common origin. On the time-of-flight images of the neck, there is loss of  flow related enhancement within the right internal carotid artery at the level of the oropharynx. On the contrast-enhanced MRA images, however, there is contrast enhancement of the segments of the right internal carotid artery. The left common and internal carotid arteries are normal. Both vertebral arteries arise from their respective subclavian arteries without origin stenosis. The vertebral system is mildly left dominant. The cervical segments of the vertebral arteries are normal. Limited imaging of the soft tissues of the neck is unremarkable. IMPRESSION: 1. 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 No  midline shift or acute hemorrhage. 2. Multiple scattered small (3-5 mm) foci of acute ischemia within the left frontal white matter and both occipital lobes. Bilateral distribution suggests a central cardioembolic source. 3. 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 The right ACA A2 segment is at least partially supplied via the anterior communicating artery. 4. Time of flight images of the neck show loss of normal flow-related enhancement in the mid-to-distal right ICA; however, the post-contrast images show normal enhancement. Taken together, this probably indicates slow flow within this segment of the right ICA. 5. MRA of the neck is otherwise normal. Electronically Signed   By: Ulyses Jarred M.D.   On: 08/17/2016 00:21      Scheduled Meds: . sodium chloride flush  3 mL Intravenous Q12H   Continuous Infusions: . dextrose 5 % and 0.9 % NaCl with KCl 40 mEq/L 100 mL/hr at 08/17/16 0903  . heparin 1,400 Units/hr (08/17/16 0902)     LOS: 2 days    Time spent: 30 minutes   Dessa Phi, DO [facility] [email or web address] Password [facility] 08/17/2016, 9:53 AM

## 2016-08-17 NOTE — Evaluation (Signed)
Speech Language Pathology Evaluation Patient Details Name: Jared Clements MRN: [id number] DOB: [birth year] Today's Date: 08/17/2016 Time: 5366-4403 SLP Time Calculation (min) (ACUTE ONLY): 35 min  Problem List:  Patient Active Problem List   Diagnosis Date Noted  . 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 Acute left hemiparesis ([place])   . Hemisensory loss   . Hemianopia, homonymous, left   . Pancreatic mass 08/15/2016  . Liver metastases ([place]) 08/15/2016  . Elevated CEA 08/09/2016  . Metastasis ([place]) 08/07/2016  . History of prostate cancer 08/06/2016  . Aortic calcification ([place]) 08/06/2016  . Solitary pulmonary nodule 08/06/2016  . Acute respiratory failure with hypoxia ([place]) 08/06/2016  . Impaired fasting glucose 08/06/2016  . Prediabetes 08/06/2016  . Elevated troponin 08/06/2016  . Demand ischemia ([place]) 08/06/2016  . Malnutrition of moderate degree 08/06/2016  . Anemia of chronic disease 08/06/2016  . Subacute pulmonary embolism with acute cor pulmonale ([place]) 08/04/2016  . Dyspnea   . HLD (hyperlipidemia) 10/24/2014  . Essential hypertension   . Hyponatremia 09/15/2012  . Hypokalemia 09/15/2012   Past Medical History:  Past Medical History:  Diagnosis Date  . Acute pancreatitis 10/24/2014  . Acute saddle pulmonary embolism with acute cor pulmonale ([place]) 08/04/2016  . Aortic calcification ([place]) 08/06/2016  . Cancer ([place])    prostates ca  . Hypertension   . Impaired fasting glucose 08/06/2016   Hemoglobin A1c 6.2% March, 2018.  Marland Kitchen Pyelonephritis 09/15/2012  . Solitary pulmonary nodule 08/06/2016   Past Surgical History:  Past Surgical History:  Procedure Laterality Date  . PROSTATE SURGERY     HPI:  Ptis a 71 y.o.malewith PMH significant for prostate cancer status post resection, pancreatic and liver masses status post recent biopsy, and recent diagnosis of acute saddle PE with heart strain, discharge from the hospital on 08/10/2016 with  Eliquisand with follow-up with surgery regarding the biopsy. Patient reports that he has continued to have a productive cough and dyspnea at home. CXR showed no radiographic evidence of acute cardiopulmonary disease. CT of head showed Evidence of acute infarct involving the anterior and middle cerebral arteries on the right with extensive brain parenchymal involvement on the right as summarized above. No hemorrhage.    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 Pt with right CVA, but is left handed. Attention and arousal could certainly impact responsiveness at this time, but pts responses to verbal only stimuli are poor (in [place] and Arabic, though family reports he is very fluent in [place]). When given max verbal, combined with visual cues pts ability to follow one step commands improves significantly. No attempt at verbalizations were made during assessment despite max cues for oral motor movement and repetition of single words and even communicative gestures. Educated pts family to potential for impaired cognition, speech and language. Will follow for functional communication goals, recommend 24 hour supervision vs SNF at d/c.     SLP Assessment  SLP Recommendation/Assessment: Patient needs continued Speech Lanaguage Pathology Services SLP Visit Diagnosis: Cognitive communication deficit (R41.841)    Follow Up Recommendations  Skilled Nursing facility    Frequency and Duration min 2x/week  2 weeks      SLP Evaluation Cognition  Overall Cognitive Status: Impaired/Different from baseline Arousal/Alertness: Awake/alert Orientation Level: Other (comment) (did not respond to basic orientation) Attention: Focused;Sustained Focused Attention: Impaired Focused Attention Impairment: Verbal basic;Functional basic Sustained Attention: Impaired Sustained Attention Impairment: Verbal basic;Functional basic  Comprehension  Auditory Comprehension Overall Auditory Comprehension: Impaired Yes/No Questions: Impaired Basic Immediate Environment Questions: 0-24% accurate Commands: Impaired One Step Basic Commands: 25-49% accurate Visual Recognition/Discrimination Discrimination: Not tested Reading Comprehension Reading Status: Not tested    Expression Verbal Expression Overall Verbal Expression: Impaired Initiation: Impaired Repetition: Impaired Level of Impairment: Word level Written Expression Dominant Hand: Left Written Expression: Not tested   Oral / Motor  Oral Motor/Sensory Function Overall Oral Motor/Sensory Function: Severe impairment Facial ROM: Reduced left;Suspected CN VII (facial) dysfunction Facial Symmetry: Abnormal symmetry left;Suspected CN VII (facial) dysfunction Facial Strength: Reduced right;Reduced left;Suspected CN VII (facial) dysfunction Lingual ROM: Suspected CN XII (hypoglossal) dysfunction (possible meakness on the left) Lingual Symmetry: Other (Comment) (would not protrude tongue) Lingual Strength: Reduced Motor Speech Overall Motor Speech: Impaired Respiration: Impaired   GO                   Herbie Baltimore, MA CCC-SLP [phone]  Lynann Beaver 08/17/2016, 9:45 AM

## 2016-08-17 NOTE — Progress Notes (Signed)
PT Cancellation Note  Patient Details Name: Jared Clements MRN: [id number] DOB: 1946/01/18   Cancelled Treatment:    Reason Eval/Treat Not Completed: Patient not medically ready. Awaiting Palliative Care consult. PT to return if available to complete eval if appropriate or wanted by family.   [name] [name] 08/17/2016, 12:11 PM   [name], PT, DPT Pager #: [phone] Office #: [phone]

## 2016-08-17 NOTE — Progress Notes (Signed)
ANTICOAGULATION CONSULT NOTE - FOLLOW UP    APTT = 97 (goal 66-85 sec) Heparin dosing weight = 77 kg   Assessment: 59 YOM with recent saddle PE on Eliquis PTA.  Patient continues on IV heparin while he is NPO from failing the swallow evaluation.  Currently using aPTT to guide heparin dosing since Eliquis could falsely elevate heparin levels.  APTT is supra-therapeutic in setting of new CVA.  No bleeding reported.   Plan: - Reduce heparin gtt to 1100 units/hr - Check 8 hr aPTT   Travaughn Vue D. Mina Marble, PharmD, BCPS Pager:  [phone] 08/17/2016, 10:19 PM

## 2016-08-17 NOTE — Progress Notes (Signed)
  Speech Language Pathology Treatment: Dysphagia  Patient Details Name: Jared Clements MRN: [id number] DOB: 09-Mar-1946 Today's Date: 08/17/2016 Time: 4742-5956 SLP Time Calculation (min) (ACUTE ONLY): 35 min  Assessment / Plan / Recommendation Clinical Impression  Pt seen to reassess readiness for PO and for pt/family education. Upon SLP arrival with observed to have labored breathing, SpO2 at 100%. SLP repositioned pt and brushed teeth, provided suction and cleared oropharynx of standing secretions. Subjectively respiratory pattern improved with these interventions, but pt was observed to elicit no involuntary swallows, minimally respond to very deep tactile stimuli to the base of tongue and have a very weak ineffective cough response. Pt was able to close mouth with labial seal on suction x2 with max verbal and tactile cues in [place] and Arabic. Very minimal effort at a smile noted with similar verbal, visual and tactile cues, left CNIIV weakness noted at rest. When ice chips provided, pt noted to move tongue to the right but otherwise did not manipulate bolus and did not close lips with hand over hand assist with a spoon.   Overall pt was awake and responsive, but unable to orally respond to POs and demonstrates inability to even attempt PO for comfort at this time. Risk of aspiration is high, particularly due to high bacterial load, likely weakened immune system with other acute illnesses and limited mechanism to protect airway (cough, swallow, gag). Encouraged family to brush pts teeth 3-4 times a day to decrease bacterial load. For now, pt to remain NPO.     HPI HPI: Ptis a 71 y.o.malewith PMH significant for prostate cancer status post resection, pancreatic and liver masses status post recent biopsy, and recent diagnosis of acute saddle PE with heart strain, discharge from the hospital on 08/10/2016 with Eliquisand with follow-up with surgery regarding the biopsy. Patient reports that he has  continued to have a productive cough and dyspnea at home. CXR showed no radiographic evidence of acute cardiopulmonary disease. CT of head showed Evidence of acute infarct involving the anterior and middle cerebral arteries on the right with extensive brain parenchymal involvement on the right as summarized above. No hemorrhage.       SLP Plan  Continue with current plan of care       Recommendations  Diet recommendations: NPO Medication Administration: Via alternative means                Oral Care Recommendations: Oral care QID Follow up Recommendations: Skilled Nursing facility SLP Visit Diagnosis: Dysphagia, oropharyngeal phase (R13.12) Plan: Continue with current plan of care       GO               [facility], MA CCC-SLP [phone]  Lynann Beaver 08/17/2016, 9:25 AM

## 2016-08-17 NOTE — Progress Notes (Signed)
ANTICOAGULATION CONSULT NOTE - Follow Up Consult  Pharmacy Consult for Heparin Indication: pulmonary embolus  No Known Allergies  Patient Measurements: Height: 6\' 2"  (188 cm) Weight: 154 lb 8 oz (70.1 kg) IBW/kg (Calculated) : 82.2 Heparin Dosing Weight:  77.1 kg  Vital Signs: Temp: 98.6 F (37 C) (03/20 1011) Temp Source: Oral (03/20 1011) BP: 140/79 (03/20 1011) Pulse Rate: 98 (03/20 1011)  Labs:  Recent Labs  08/15/16 2010 08/16/16 0224 08/16/16 0845 08/16/16 0859 08/16/16 1800 08/17/16 0222 08/17/16 1202  HGB 10.7* 9.3* 9.8*  --   --  9.5*  --   HCT 32.4* 28.8* 30.3*  --   --  29.1*  --   PLT 203 191 181  --   --  192  --   APTT  --   --   --   --  37* 97* 121*  LABPROT  --   --  23.7*  --   --   --   --   INR  --   --  2.08  --   --   --   --   HEPARINUNFRC  --   --   --   --  >2.20*  --  1.52*  CREATININE 0.84  --  0.65  --   --  0.72  --   TROPONINI  --  0.89*  --  0.71*  --   --   --    Estimated Creatinine Clearance: 81.5 mL/min (by C-G formula based on SCr of 0.72 mg/dL).  Assessment: 71 yo male with saddle PE. Not an EKOS candidate d/t multiple liver lesions suspicious for metastatic cancer. He was changed to [place] (received one dose of 10mg  3/18 at 0100). CT of head this am shows acute ischemic stroke involving right ACA and right MCA territories. Patient currently NPO after failing swallow exam so bridging with heparin, neurology consulted and recommended continuation of anticoagulation. On 3/20, Dr. Shon Hale recommends using lower heparin goal given the severity of the stroke. Patient has been dosed using the normal heparin/aPTT goals with most recent level resulting supra therapeutic. Will hold dose for an hour then restart at lower rate. Spoke to nurse who reports no bleeding at this time.   Goal of Therapy:  aPTT goal 66-85 Heparin level 0.3-0.5 units/ml   Monitor platelets by anticoagulation protocol: Yes   Plan:  Hold heparin for 1 hour Reduce  heparin gtt to 1250 units/hr F/u 8 hr aPTT  Georga Bora, PharmD Clinical Pharmacist Pager: [phone] 08/17/2016 2:09 PM

## 2016-08-17 NOTE — Progress Notes (Signed)
OT Cancellation Note  Patient Details Name: Jared Clements MRN: [id number] DOB: 10-31-1945   Cancelled Treatment:    Reason Eval/Treat Not Completed: Patient not medically ready. Awaiting Palliative Care consult. OT to return as able to complete eval if appropriate.  Norman Herrlich, MS OTR/L  Pager: [phone]   Norman Herrlich 08/17/2016, 2:08 PM

## 2016-08-17 NOTE — Consult Note (Signed)
Consultation Note Date: 08/17/2016   Patient Name: Jared Clements  DOB: 1945-06-13  MRN: [id number]  Age / Sex: 71 y.o., male  PCP: Harvie Junior, MD Referring Physician: Shon Millet*  Reason for Consultation: Establishing goals of care and Psychosocial/spiritual support  HPI/Patient Profile: 71 y.o. male  with past medical history of prostate cancer s/p resection, pancreatic adenocarcinoma with liver metastases (recently diagnosed by liver biopsy 08/09/16), and acute saddle PE with heart strain (noted in prior admission, started on Eliquis). Home health nurse noted him to be dyspneic and hypoxic at home, and directed him to the ED. He was admitted on 08/15/2016 with acute hypoxemic respiratory failure due to recent PE (of note, had not been a candidate for home O2 at prior discharge). Unfortunately, on 3/19 he was found to be obtunded and somnolent. Brain imaging revealed a large right-sided acute infarct involving the entirety of the right middle and anterior cerebral artery territories with associated cytotoxic edema. Neurology following. Palliative was consulted to assist in goals of care given newly diagnosed metastatic pancreatic adenocarcinoma and acute large stroke  Clinical Assessment and Goals of Care: [name] is unable to participate in a goals of care conversation secondary to acute encephalopathy. I met with two of his sons and three close family friends outside of the room, per their request. They were able to articulate a good grasp of the medical issues [name] was facing. They understood he had a new cancer in his pancreas that had spread to his liver. He had a new stroke that affected the right side of his brain and resulted in significant function issues including inability to consistently speak, swallow, and move the left side of his body. And they understood his breathing was made difficult by blood clots in his lungs.  While able  to articulate each issue, they do struggle to grasp the broader implications when considering these issues in combination. I gently touched on the prognosis of a serious stroke and metastatic pancreatic cancer independently, then discussed the challenges when considering them together. They were able to hear the poor prognosis and explain it back to me. What they struggle with, however, is feeling like they need more information about the cancer and treatment options. They understand that stroke management is based on time and physical therapy, but they don't fully understand the options [if any] for cancer management. That information will likely shape their decisions surrounding issues such as a feeding tube, code status, and disposition.  Finally, they had not had a chance to discuss life and death wishes with [name] yet, and are clearly overwhelmed by the rapid change in his status and his inability to communicate his wishes. Culturally, the first born would direct and guide family decisions (his eldest three children our not in the [place]), so the family is working with close family friends and communicating with family abroad to discuss the situation. They did mention that they are strongly guided by their [place].  Primary Decision Maker Family and close family friends cooperatively make decisions.   SUMMARY OF RECOMMENDATIONS    Full code, continue supportive care  Need in-patient Oncology consult to discuss new cancer diagnosis, treatment options, and prognosis  Will follow-up after Oncology has seen them to help guide additional conversations on goals of care  Code Status/Advance Care Planning:  Full code   Symptom Management:  [name] appears comfortable in bed with no signs of distress. Family concerned about mouth breathing and use of  [place], so discussed with care nurse evaluation and consideration of face-mask as needed.   -Start PRN dulcolax for constipation prevention -Start  PRN morphine for moderate pain  Palliative Prophylaxis:   Aspiration, Bowel Regimen, Delirium Protocol, Frequent Pain Assessment, Oral Care and Turn Reposition  Additional Recommendations (Limitations, Scope, Preferences):  Full Scope Treatment  Psycho-social/Spiritual:   Desire for further Chaplaincy support:Did not ask, will f/u on this tomorrow  Additional Recommendations: TBD  Prognosis:   < 6 months. Pt with metastatic pancreatic cancer and new large stroke with significant residual deficits.   Discharge Planning: To Be Determined      Primary Diagnoses: Present on Admission: . Acute respiratory failure with hypoxia ([place]) . Demand ischemia ([place]) . Essential hypertension . Elevated troponin . Anemia of chronic disease . Pancreatic mass . Liver metastases ([place]) . Subacute pulmonary embolism with acute cor pulmonale ([place])   I have reviewed the medical record, interviewed the patient and family, and examined the patient. The following aspects are pertinent.  Past Medical History:  Diagnosis Date  . Acute pancreatitis 10/24/2014  . Acute saddle pulmonary embolism with acute cor pulmonale ([place]) 08/04/2016  . Aortic calcification ([place]) 08/06/2016  . Cancer ([place])    prostates ca  . Hypertension   . Impaired fasting glucose 08/06/2016   Hemoglobin A1c 6.2% March, 2018.  Marland Kitchen Pyelonephritis 09/15/2012  . Solitary pulmonary nodule 08/06/2016   Social History   Social History  . Marital status: Married    Spouse name: N/A  . Number of children: N/A  . Years of education: N/A   Social History Main Topics  . Smoking status: Never Smoker  . Smokeless tobacco: Never Used  . Alcohol use No  . Drug use: No  . Sexual activity: No   Other Topics Concern  . None   Social History Narrative  . None   Family History  Problem Relation Age of Onset  . Other Mother   . Other Father    Scheduled Meds: . sodium chloride flush  3 mL Intravenous Q12H   Continuous  Infusions: . dextrose 5 % and 0.9 % NaCl with KCl 40 mEq/L 100 mL/hr at 08/17/16 0903  . heparin 1,250 Units/hr (08/17/16 1354)   PRN Meds:.acetaminophen, bisacodyl, ipratropium-albuterol, morphine injection, [DISCONTINUED] ondansetron **OR** ondansetron (ZOFRAN) IV No Known Allergies   Review of Systems: Unable to obtain d/t encephalopathy  Physical Exam  Constitutional: Vital signs are normal. He appears listless. He has a sickly appearance. No distress.  Frail man appears older than stated age  HENT:  Mouth/Throat: Mucous membranes are dry. Abnormal dentition.  Cardiovascular: Normal rate and regular rhythm.   Pulmonary/Chest: No accessory muscle usage. Tachypnea noted. He has no wheezes.  Abdominal: Soft. Bowel sounds are normal.  Musculoskeletal:  Spontaneous non-purposeful movement of RUE. Responsive movement of RLE. Could not elicit LUE/LLE movement.   Neurological: He appears listless.  Non-verbal.  Skin: Skin is warm and dry.    Vital Signs: BP 140/79 (BP Location: Right Arm)   Pulse 98   Temp 98.6 F (37 C) (Oral)   Resp 20   Ht 6' 2"  (1.88 m)   Wt 70.1 kg (154 lb 8 oz)   SpO2 99%   BMI 19.84 kg/m  Pain Assessment: 0-10   Pain Score: Asleep  SpO2: SpO2: 99 % O2 Device:SpO2: 99 % O2 Flow Rate: .O2 Flow Rate (L/min): 3 L/min  IO: Intake/output summary:   Intake/Output Summary (Last 24 hours) at 08/17/16 1507 Last data filed  at 08/17/16 0640  Gross per 24 hour  Intake              768 ml  Output              650 ml  Net              118 ml    LBM: Last BM Date:  (prior to arrival) Baseline Weight: Weight: 72.6 kg (160 lb) Most recent weight: Weight: 70.1 kg (154 lb 8 oz)     Palliative Assessment/Data: PPS 10%   Flowsheet Rows     Most Recent Value  Intake Tab  Referral Department  Hospitalist  Unit at Time of Referral  Cardiac/Telemetry Unit  Palliative Care Primary Diagnosis  Cancer  Date Notified  08/16/16  Palliative Care Type  New  Palliative care  Reason for referral  Clarify Goals of Care  Date of Admission  08/15/16  # of days IP prior to Palliative referral  1  Clinical Assessment  Psychosocial & Spiritual Assessment  Palliative Care Outcomes     Time Total: 70 minutes Greater than 50%  of this time was spent counseling and coordinating care related to the above assessment and plan.  Signed by: Charlynn Court, NP Palliative Medicine Team Pager # [phone] (M-F 7a-5p) Team Phone # [phone] (Nights/Weekends)

## 2016-08-17 NOTE — Progress Notes (Signed)
STROKE TEAM PROGRESS NOTE   SUBJECTIVE (INTERVAL HISTORY) His multiple family members are at the bedside. Dr Leonie Man discussed in depth his diagnosis and treatment plan. Dr. Leonie Man  recommended palliative care. Family interested in possible craniectomy, which is not an option for this patient.    OBJECTIVE Temp:  [98.6 F (37 C)-100 F (37.8 C)] 98.6 F (37 C) (03/20 1011) Pulse Rate:  [98-106] 98 (03/20 1011) Cardiac Rhythm: Normal sinus rhythm (03/20 0700) Resp:  [20-25] 20 (03/20 1011) BP: (125-166)/(77-88) 140/79 (03/20 1011) SpO2:  [98 %-100 %] 99 % (03/20 1011) Weight:  [70.1 kg (154 lb 8 oz)] 70.1 kg (154 lb 8 oz) (03/20 0043)  CBC:  Recent Labs Lab 08/16/16 0224 08/16/16 0845 08/17/16 0222  WBC 17.1* 18.5* 18.7*  NEUTROABS 12.9*  --   --   HGB 9.3* 9.8* 9.5*  HCT 28.8* 30.3* 29.1*  MCV 83.2 83.2 82.9  PLT 191 181 726    Basic Metabolic Panel:  Recent Labs Lab 08/16/16 0845 08/17/16 0222  NA 133* 133*  K 4.1 3.4*  CL 104 101  CO2 20* 22  GLUCOSE 132* 108*  BUN 6 7  CREATININE 0.65 0.72  CALCIUM 8.3* 8.3*  MG 1.7  --   PHOS 3.2  --     Lipid Panel:    Component Value Date/Time   CHOL 110 08/06/2016 0811   TRIG 99 08/06/2016 0811   HDL 20 (L) 08/06/2016 0811   CHOLHDL 5.5 08/06/2016 0811   VLDL 20 08/06/2016 0811   LDLCALC 70 08/06/2016 0811    PHYSICAL EXAM Frail elderly african male not in distress. . Afebrile. Head is nontraumatic. Neck is supple without bruit.    Cardiac exam no murmur or gallop. Lungs are clear to auscultation. Distal pulses are well felt. Neurological Exam :  Drowsy but opens eyes and follows fe midline and right sided commands. Right gaze preference and unable to look to left side. Pupils equal and reactive.fundi not visualized. Blinks to threat on right but not on left side. Left lower face weakness. Tongue midline. Dense left hemiplegia with hypotonia. Purposeful movements right UE nd withdrawas to pain in  RLE. ASSESSMENT/PLAN Mr. Jared Clements is a 71 y.o. male with history of prostate cancer status post resection, metastatic pancreatic cancer with liver mets, recent diagnosis of saddle PE with heart strain discharged from the hospital on 08/10/2016 with Eliquis presenting with hypoxia which progressed to unresponsiveness. CT head showed large right hemispheric stroke. He did not receive IV t-PA due to delay in arrival, on eliquis, large stroke.   Stroke:  Large R MCA/ACA territory infarct with cytotoxic edema and multiple L frontal white matter and occipital lobe infarcts. Infarcts embolic secondary to hypercoagulable state from metastatic pancreatic cancer  CT head large R ACA and MCA infarct with extensive cytotoxic edema. Effacement of the ventricles on the right. No midline shift. Atrophy. Small vessel disease. Acute thrombus in R ICA to R MCA suspected. Aortic vascular calcification. Paranasal sinus disease.   MRI  large right MCA and ACA infarct with cytotoxic edema. Multiple L frontal white matter and occipital lobe infarcts   MRA head occluded R MCA and are ACA A1   MRA neck loss of flow mid to distal R ICA (felt to be slow flow), otherwise normal  2D Echo  pending  LDL 70  HgbA1c 6.2  On IV heparin for VTE prophylaxis  Diet NPO time specified  Eliquis (apixaban) daily prior to admission, now on heparin IV  Patient stroke is large and disabling.  In setting of metastatic pancreatic cancer, do not feel this is survivable with any quality of life. Surgery is not an option. Discussed with family. Palliative care recommended. They are asking about feeding tubes and interested in aggressive treatment.  Therapy recommendations:  pending   Disposition:  pending   Hypertension  Stable  Permissive hypertension (OK if < 220/120) but gradually normalize in 5-7 days  Long-term BP goal normotensive  Hyperlipidemia  Home meds:  No stastin  LDL at 70, just above goal  Not a statin  candidate secondary to liver metastasis  Other Stroke Risk Factors  Advanced age   UDS not performed  Other Active Problems  Pancreatis mass with liver metastasis  Saddle PE on eliquis with heart strain, changed to IV heparin, recent d/c 3.13.18  Hypoxic respiratory failure  SIRS  Elevated troponin  Chronic normocytic anemia  Hypokalemia   Hospital day # [name] for Pager information 08/17/2016 4:36 PM  I have personally examined this patient, reviewed notes, independently viewed imaging studies, participated in medical decision making and plan of care.ROS completed by me personally and pertinent positives fully documented  I have made any additions or clarifications directly to the above note. Agree with note above. He has presented with a large right hemispheric infarct secondary to right ICA occlusion likely from hypercoagulability from pancreatic malignancy. With recent PE.Prognosis for survival poor with quality of life likely abysmal. Long d/w wife, son and other family members and answered questions. Strongly recommend palliative care approach. He is likely to get worse from impending brain edema in next few days and need ventilatory support to survive. Family needs time to discuss goals of care.Greatar than 50% time during this 40 minute visit was spent on counselling family members about his large stroke, impending development of cerebral edema and neurological worsening and answering questions.  Antony Contras, MD Medical Director [place] Pager: [phone] 08/17/2016 9:58 PM  To contact Stroke Continuity provider, please refer to [email or web address] After hours, contact General Neurology

## 2016-08-17 NOTE — Progress Notes (Signed)
ANTICOAGULATION CONSULT NOTE - Follow Up Consult  Pharmacy Consult for Heparin (Apixaban on hold) Indication: pulmonary embolus  No Known Allergies  Patient Measurements: Height: 6\' 2"  (188 cm) Weight: 154 lb 8 oz (70.1 kg) IBW/kg (Calculated) : 82.2  Vital Signs: Temp: 99.2 F (37.3 C) (03/20 0245) Temp Source: Axillary (03/20 0245) BP: 166/80 (03/20 0245) Pulse Rate: 101 (03/20 0245)  Labs:  Recent Labs  08/15/16 2010 08/16/16 0224 08/16/16 0845 08/16/16 0859 08/16/16 1800 08/17/16 0222  HGB 10.7* 9.3* 9.8*  --   --  9.5*  HCT 32.4* 28.8* 30.3*  --   --  29.1*  PLT 203 191 181  --   --  192  APTT  --   --   --   --  37* 97*  LABPROT  --   --  23.7*  --   --   --   INR  --   --  2.08  --   --   --   HEPARINUNFRC  --   --   --   --  >2.20*  --   CREATININE 0.84  --  0.65  --   --  0.72  TROPONINI  --  0.89*  --  0.71*  --   --     Estimated Creatinine Clearance: 81.5 mL/min (by C-G formula based on SCr of 0.72 mg/dL).   Assessment: Heparin while apixaban on hold due to failing swallow exam. Recent diagnosis of PE. Initial aPTT is therapeutic at 97, using aPTT to dose for now given apixaban influence on anti-Xa levels (baseline elevated at >2.2).   Goal of Therapy:  Heparin level 0.3-0.7 units/ml aPTT 66-102 seconds Monitor platelets by anticoagulation protocol: Yes   Plan:  -Cont heparin 1400 units/hr -1200 aPTT/HL  Narda Bonds 08/17/2016,3:14 AM

## 2016-08-18 ENCOUNTER — Other Ambulatory Visit (HOSPITAL_COMMUNITY): Payer: Medicare Other

## 2016-08-18 DIAGNOSIS — R748 Abnormal levels of other serum enzymes: Secondary | ICD-10-CM | POA: Diagnosis not present

## 2016-08-18 DIAGNOSIS — I639 Cerebral infarction, unspecified: Secondary | ICD-10-CM | POA: Diagnosis not present

## 2016-08-18 DIAGNOSIS — I63231 Cerebral infarction due to unspecified occlusion or stenosis of right carotid arteries: Secondary | ICD-10-CM | POA: Diagnosis not present

## 2016-08-18 DIAGNOSIS — Z7189 Other specified counseling: Secondary | ICD-10-CM | POA: Diagnosis not present

## 2016-08-18 DIAGNOSIS — C259 Malignant neoplasm of pancreas, unspecified: Secondary | ICD-10-CM | POA: Diagnosis not present

## 2016-08-18 DIAGNOSIS — G9340 Encephalopathy, unspecified: Secondary | ICD-10-CM

## 2016-08-18 DIAGNOSIS — J9601 Acute respiratory failure with hypoxia: Secondary | ICD-10-CM | POA: Diagnosis not present

## 2016-08-18 DIAGNOSIS — R0600 Dyspnea, unspecified: Secondary | ICD-10-CM | POA: Diagnosis not present

## 2016-08-18 DIAGNOSIS — Z515 Encounter for palliative care: Secondary | ICD-10-CM | POA: Diagnosis not present

## 2016-08-18 DIAGNOSIS — I2609 Other pulmonary embolism with acute cor pulmonale: Secondary | ICD-10-CM | POA: Diagnosis not present

## 2016-08-18 LAB — BASIC METABOLIC PANEL
Anion gap: 6 (ref 5–15)
BUN: 7 mg/dL (ref 6–20)
CO2: 22 mmol/L (ref 22–32)
Calcium: 8.1 mg/dL — ABNORMAL LOW (ref 8.9–10.3)
Chloride: 106 mmol/L (ref 101–111)
Creatinine, Ser: 0.59 mg/dL — ABNORMAL LOW (ref 0.61–1.24)
GFR calc Af Amer: >60 mL/min (ref >60)
GLUCOSE: 133 mg/dL — AB (ref 65–99)
Potassium: 4 mmol/L (ref 3.5–5.1)
Sodium: 134 mmol/L — ABNORMAL LOW (ref 135–145)

## 2016-08-18 LAB — CBC
HCT: 28.8 % — ABNORMAL LOW (ref 39.0–52.0)
Hemoglobin: 9.1 g/dL — ABNORMAL LOW (ref 13.0–17.0)
MCH: 26.5 pg (ref 26.0–34.0)
MCHC: 31.6 g/dL (ref 30.0–36.0)
MCV: 83.7 fL (ref 78.0–100.0)
PLATELETS: 201 10*3/uL (ref 150–400)
RBC: 3.44 MIL/uL — ABNORMAL LOW (ref 4.22–5.81)
RDW: 15.9 % — AB (ref 11.5–15.5)
WBC: 16.9 10*3/uL — AB (ref 4.0–10.5)

## 2016-08-18 LAB — MAGNESIUM: MAGNESIUM: 1.7 mg/dL (ref 1.7–2.4)

## 2016-08-18 LAB — APTT
APTT: 66 s — AB (ref 24–36)
aPTT: 80 seconds — ABNORMAL HIGH (ref 24–36)

## 2016-08-18 LAB — GLUCOSE, CAPILLARY: Glucose-Capillary: 145 mg/dL — ABNORMAL HIGH (ref 65–99)

## 2016-08-18 LAB — HEPARIN LEVEL (UNFRACTIONATED): Heparin Unfractionated: 0.8 IU/mL — ABNORMAL HIGH (ref 0.30–0.70)

## 2016-08-18 MED ORDER — BISACODYL 10 MG RE SUPP
10.0000 mg | Freq: Once | RECTAL | Status: AC
  Administered 2016-08-18: 10 mg via RECTAL
  Filled 2016-08-18: qty 1

## 2016-08-18 MED ORDER — SODIUM CHLORIDE 0.9 % IV SOLN
INTRAVENOUS | Status: DC
  Administered 2016-08-19 – 2016-08-20 (×3): via INTRAVENOUS

## 2016-08-18 NOTE — Progress Notes (Signed)
Daily Progress Note   Patient Name: Jared Clements       Date: 08/18/2016 DOB: Jul 01, 1945  Age: 71 y.o. MRN#: [id number] Attending Physician: Modena Jansky, MD Primary Care Physician: Harvie Junior, MD Admit Date: 08/15/2016  Reason for Consultation/Follow-up: Establishing goals of care and Psychosocial/spiritual support  Subjective: Jared Clements remains similar to yesterday. His family reports that he opened his eyes to voice this morning, however he remained unresponsive when I was in the room. He does continue to have non-purposeful movement of his RUE. No signs of distress or discomfort.   Length of Stay: 3  Current Medications: Scheduled Meds:  . sodium chloride flush  3 mL Intravenous Q12H    Continuous Infusions: . heparin 1,150 Units/hr (08/18/16 0818)    PRN Meds: acetaminophen, bisacodyl, ipratropium-albuterol, morphine injection, [DISCONTINUED] ondansetron **OR** ondansetron (ZOFRAN) IV  Physical Exam         Constitutional: Vital signs are normal. He appears listless. He has a sickly appearance. No distress.  Frail man appears older than stated age  HENT:  Mouth/Throat: Mucous membranes are dry.  Poor dentition Cardiovascular: Normal rate and regular rhythm.   Pulmonary/Chest: No accessory muscle usage. Tachypnea noted. He has no wheezes.  Abdominal: Soft. Bowel sounds are normal.  Musculoskeletal:  Spontaneous non-purposeful movement of RUE. Responsive movement of RLE. Could not elicit LUE/LLE movement.   Neurological:   Non-verbal. Unresponsive Skin: Skin is warm and dry.   Vital Signs: BP (!) 144/80 (BP Location: Right Arm)   Pulse 96   Temp 97.7 F (36.5 C) (Oral)   Resp 20   Ht 6' 2"  (1.88 m)   Wt 70.1 kg (154 lb 8 oz)   SpO2 100%   BMI 19.84 kg/m  SpO2: SpO2: 100 % O2 Device: O2 Device: Nasal  Cannula O2 Flow Rate: O2 Flow Rate (L/min): 2 L/min  Intake/output summary:  Intake/Output Summary (Last 24 hours) at 08/18/16 1238 Last data filed at 08/18/16 0355  Gross per 24 hour  Intake          1098.35 ml  Output             1850 ml  Net          -751.65 ml   LBM: Last BM Date:  (prior to arrival) Baseline Weight: Weight: 72.6 kg (160 lb) Most recent weight: Weight: 70.1 kg (154 lb 8 oz)  Palliative Assessment/Data: PPS 10%   Flowsheet Rows     Most Recent Value  Intake Tab  Referral Department  Hospitalist  Unit at Time of Referral  Cardiac/Telemetry Unit  Palliative Care Primary Diagnosis  Cancer  Date Notified  08/16/16  Palliative Care Type  New Palliative care  Reason for referral  Clarify Goals of Care  Date of Admission  08/15/16  # of days IP prior to Palliative referral  1  Clinical Assessment  Psychosocial & Spiritual Assessment  Palliative Care Outcomes      Patient Active Problem List   Diagnosis Date Noted  . Pancreatic adenocarcinoma ([place])   . Goals of care, counseling/discussion   . Palliative care by specialist   . Acute ischemic right internal carotid artery (ICA) stroke ([place])   .  Acute left hemiparesis ([place])   . Hemisensory loss   . Hemianopia, homonymous, left   . Pancreatic mass 08/15/2016  . Liver metastases ([place]) 08/15/2016  . Elevated CEA 08/09/2016  . Metastasis ([place]) 08/07/2016  . History of prostate cancer 08/06/2016  . Aortic calcification ([place]) 08/06/2016  . Solitary pulmonary nodule 08/06/2016  . Acute respiratory failure with hypoxia ([place]) 08/06/2016  . Impaired fasting glucose 08/06/2016  . Prediabetes 08/06/2016  . Elevated troponin 08/06/2016  . Demand ischemia ([place]) 08/06/2016  . Malnutrition of moderate degree 08/06/2016  . Anemia of chronic disease 08/06/2016  . Subacute pulmonary embolism with acute cor pulmonale ([place]) 08/04/2016  . Dyspnea   . HLD (hyperlipidemia) 10/24/2014  . Essential hypertension   .  Hyponatremia 09/15/2012  . Hypokalemia 09/15/2012    Palliative Care Assessment & Plan   HPI: 71 y.o. male  with past medical history of prostate cancer s/p resection, pancreatic adenocarcinoma with liver metastases (recently diagnosed by liver biopsy 08/09/16), and acute saddle PE with heart strain (noted in prior admission, started on Eliquis). Home health nurse noted him to be dyspneic and hypoxic at home, and directed him to the ED. He was admitted on 08/15/2016 with acute hypoxemic respiratory failure due to recent PE (of note, had not been a candidate for home O2 at prior discharge). Unfortunately, on 3/19 he was found to be obtunded and somnolent. Brain imaging revealed a large right-sided acute infarct involving the entirety of the right middle and anterior cerebral artery territories with associated cytotoxic edema. Neurology following. Palliative was consulted to assist in goals of care given newly diagnosed metastatic pancreatic adenocarcinoma and acute large stroke  Assessment: I met with Jared Clements family yesterday and had an extensive conversation with them. Please see my initial consult note for full details. In brief, they felt they needed Oncology to weigh-in on possible treatment options, prior to making a formal decision. Additionally, they needed to discuss the options with family abroad, which included the first born son (who culturally makes decisions for the family if the father is unable).  Dr. Alvy Bimler met with the family yesterday evening and explained that he is not a candidate for any kind of systemic treatment given his poor performance status. Furthermore, she recommended against interruption of anticoagulation for any procedure (such as feeding tube placement), and reiterated that a feeding tube would not change his course.  I met with the family again today. Two sons (including the eldest locally, [name]) and two daughters were present. We reviewed the information Oncology  had given them. They understood the cancer could not be treated. They also knew there was nothing further to be done about the stroke. Unfortunately, there still seems to be a barrier to connecting these two life-limiting issues with end of life. Past of the issues is that they need direction from their eldest brother, who does not live in the [place]. The other issues is that they struggle to understand the concept of end of life when their father is breathing in front of them. Yesterday, one family member explained to me that a person's breath signifies God's will for life.   At present, they are most concerned with ensuring his nutritional needs are met. Different providers had brought up venues for nutrition: IV, NG, PEG. They had numerous questions about these routes and were attempting to elicit my opinion on which would be the best option. I explained the significant risks associated with each. I also reiterated that nutrition would not  change his course, it wound only prolong his life without improving it. While they acknowledged my explanations, I do not believe they fully understood it as we continued to circle back to the same line of questions.   Finally, the family was able to share that they were working to set up a family meeting with family abroad to discuss the plan for moving forward. I tried to explore what options they were considering, given his cancer cannot be treated and placing a feeding tube would be extremely risky and not recommended. [name] was not able to clarify what they were discussing, but explained that they needed time to consider things and discuss further. At this juncture, I believe the Palliative role would be to continue to support the family with information and clarification, however I do not believe decisions will be made before they can have this family meeting.   Recommendations/Plan:  Full code, continue supportive care  Family working to set up family meeting for  further discussions  Goals of Care and Additional Recommendations:  Limitations on Scope of Treatment: Full Scope Treatment  Code Status:  Full code  Prognosis:   Unable to determine; pt has terminal metastatic pancreatic cancer and now a debilitating stroke for which he is unresponsive and not eating or drinking. His family may opt to pursue artificial hydration and nutrition, which would prolong his life. Regardless, I do not believe he has more than a few months at best.  Discharge Planning:  To Be Determined  Care plan was discussed with pt's family and primary team.  Thank you for allowing the Palliative Medicine Team to assist in the care of this patient.  Time in: 1120 Time out: 1210 Total time: 70 minutes    Greater than 50%  of this time was spent counseling and coordinating care related to the above assessment and plan.  Charlynn Court, NP Palliative Medicine Team [phone] pager (7a-5p) Team Phone # [phone]

## 2016-08-18 NOTE — Progress Notes (Signed)
PROGRESS NOTE    Jared Clements  DEY:[id number] DOB: 1946-04-20 DOA: 08/15/2016 PCP: Harvie Junior, MD     Brief Narrative:  Jared Clements is a 71 y.o. male with medical history significant for prostate cancer status post resection, pancreatic and liver masses status post recent biopsy, and recent diagnosis of acute saddle PE with heart strain, discharge from the hospital on 08/10/2016 with Eliquis and with follow-up with surgery regarding the biopsy. Patient reports that he continued to have a cough and dyspnea at home and describes his chest pain as slowly improving. He has been dyspneic with minimal exertion, but feels that this is fairly stable from time of recent discharge. He denies fevers or chills and denies leg swelling or orthopnea. He reports continued adherence with the anticoagulant. Home health nurse visited him this afternoon, noted him to be hypoxic, and directed him to the emergency department for further evaluation. He was admitted due to acute hypoxemic respiratory failure, due to recent PE.   On 3/19, patient was noted to be somnolent and obtunded. Stat CT of the head revealed a large right-sided stroke. Stroke team was consulted. He was out of the window for tPA. He continues to be somnolent, although does awaken to voice and follows simple commands.  Oncology recommends consideration to transitioning to comfort care only and so does neurology. Palliative care on board. Patient's family planning to meet this weekend to further discuss goals of care.  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 Has been adherent to Eliquis. Repeat CTA chest with improvement in PE and RV-to-LV ratio. Did not meet home O2 requirement at time of previous discharge. Returned to hospital with chief complaint of dyspnea.  -Continue [place] O2  Acute encephalopathy likely due to large right ACA and MCA stroke, multiple small foci of acute ischemic left frontal and bilateral occipital lobes -Bilateral distribution suggesting cardioembolic source -No previous hx of A Fib, currently sinus rhythm  -Stroke team following -SLP: NPO  -PT/OT -Echo LVEF 55-60 percent. - Discussed with Dr. Leonie Man, neurology. Patient's large right MCA/ACA territory infarct with cytotoxic edema and multiple left frontal white matter and occipital lobe infarcts. Infarcts embolic secondary to hypercoagulable state from metastatic pancreatic cancer. He indicates very poor prognosis and recommends Comfort Care. Patient currently on IV heparin.  PE with heart strain -Holding eliquis due to NPO. Continue heparin gtt   Metastatic pancreatic cancer -Pancreatic mass noted on CT during recent admission, as well as numerous liver masses concerning for mets. US-guided biopsy of liver lesion performed on 08/09/16, which showed adenocarcinoma with morphology consistent with metastatic pancreatic adenocarcinoma  -Dr. Alvy Bimler input appreciated. Discussed with her this morning. Recommends Comfort Care. -Palliative care input appreciated. Met extended family in room with palliative care team. Family indicates that they are planning to meet with extended family towards the end of this week to make further  decisions. Discussed again regarding overall poor prognosis and would not recommend artificial  means of feeding i.e. PEG tube/NG tube or TPN.  SIRS -Without evidence of infection. Patient is tachycardic, tachypneic, with leukocytosis, but afebrile. CTA chest without pneumonia, influenza screen negative, UA without infection  -Blood cultures pending -Sputum culture pending  -Started on levaquin empirically but will hold for now without gross evidence of infection. Could be secondary to cancer and above illnesses as outlined above  -IVF   Elevated troponin -Due to PE and demand ischemia, previously admission with peak of troponin at 9.7 -Had pleuritic chest pain prior to admission which has been improving. No further chest pain or pressure per son's report  -Trended down 0.89 --> 0.71   HTN -Holding Norvasc due to NPO   Chronic normocytic anemia -Stable, baseline Hgb around 9-10   Hypokalemia -Replace and trend   Goals of care -Dr. Maylene Roes Discussed with son and with multiple family members. He has metastatic pancreatic cancer that is newly diagnosed. He also suffered from a PE and now a large stroke. He remains minimally verbal, follows some commands, not safe to eat. Discussed with them that his prognosis is poor. Unless he makes improvements in swallowing, nutrition, activity, he will likely not be a candidate for treatment measures for his metastatic pancreatic cancer, which carries a poor prognosis in any case. It is unclear that they understand or grasp the gravity of patient's condition. They tell me that they will continue to pray and hope that he can get a feeding tube soon for nutrition. Feeding tube was also discussed that it likely may not give any quality of life for patient. Palliative care consulted.  As per my discussion with family along with palliative care team today, it appears that family is getting a grasp of poor prognosis.  DVT prophylaxis: Heparin Code Status: Full. Discussed with son this morning. He is Scientist, research (medical). Currently full code. Unsure if  family understands gravity of patient's condition and prognosis  Family Communication: multiple family members at bedside-2 sons and 3 daughters Disposition Plan: pending further stabilization, very poor prognosis    Consultants:   Palliative care   Oncology to follow as outpatient   Neurology   Procedures:   None  Antimicrobials:   Levaquin stopped    Subjective: During my visit patient was asleep and I did not attempt to wake him up he had as per family's report, patient was awake earlier this morning and did speak some.   Objective: Vitals:   08/18/16 0513 08/18/16 1009 08/18/16 1518 08/18/16 1740  BP: (!) 139/92 (!) 144/80 (!) 163/77 (!) 154/78  Pulse: 83 96 (!) 105 94  Resp: 20 20 20 20   Temp: 98.5 F (36.9 C) 97.7 F (36.5 C) 98.1 F (36.7 C) 98.2 F (36.8 C)  TempSrc: Axillary Oral Oral Oral  SpO2: 100% 100% 98% 100%  Weight:      Height:        Intake/Output Summary (Last 24 hours) at 08/18/16 1909 Last data filed at 08/18/16 1800  Gross per 24 hour  Intake           220.45 ml  Output             1200 ml  Net          -979.55 ml   Filed Weights   08/15/16 2010 08/16/16 0123 08/17/16 0043  Weight: 72.6 kg (160 lb) 69.7 kg (153 lb 9.6 oz) 70.1 kg (154 lb  8 oz)    Examination:  General exam: Ill appearing  ENT: poor dentition  Respiratory system: Clear to auscultation. Respiratory effort normal on [place] O2  Cardiovascular system: S1 & S2 heard, regular rhythm. No JVD, murmurs, rubs, gallops or clicks. No pedal edema.Telemetry: Sinus rhythm. Gastrointestinal system: Abdomen is nondistended, soft and nontender. No organomegaly or masses felt. Normal bowel sounds heard. Central nervous system: Sleeping this morning and did not attempt to arouse.  Extremities: Symmetric  Skin: No rashes, lesions or ulcers on visible skin   Data Reviewed: I have personally reviewed following labs and imaging studies  CBC:  Recent Labs Lab 08/15/16 2010  08/16/16 0224 08/16/16 0845 08/17/16 0222 08/18/16 0619  WBC 19.9* 17.1* 18.5* 18.7* 16.9*  NEUTROABS  --  12.9*  --   --   --   HGB 10.7* 9.3* 9.8* 9.5* 9.1*  HCT 32.4* 28.8* 30.3* 29.1* 28.8*  MCV 83.5 83.2 83.2 82.9 83.7  PLT 203 191 181 192 235   Basic Metabolic Panel:  Recent Labs Lab 08/15/16 2010 08/16/16 0845 08/17/16 0222 08/18/16 0619  NA 133* 133* 133* 134*  K 3.6 4.1 3.4* 4.0  CL 98* 104 101 106  CO2 20* 20* 22 22  GLUCOSE 108* 132* 108* 133*  BUN 9 6 7 7   CREATININE 0.84 0.65 0.72 0.59*  CALCIUM 8.4* 8.3* 8.3* 8.1*  MG  --  1.7  --  1.7  PHOS  --  3.2  --   --    GFR: Estimated Creatinine Clearance: 81.5 mL/min (A) (by C-G formula based on SCr of 0.59 mg/dL (L)). Liver Function Tests:  Recent Labs Lab 08/15/16 2010 08/16/16 0845  AST 59* 55*  ALT 38 38  ALKPHOS 133* 136*  BILITOT 1.2 1.3*  PROT 6.6 7.4  ALBUMIN 1.9* 2.1*    Recent Labs Lab 08/15/16 2010  LIPASE 117*   No results for input(s): AMMONIA in the last 168 hours. Coagulation Profile:  Recent Labs Lab 08/16/16 0845  INR 2.08   Cardiac Enzymes:  Recent Labs Lab 08/16/16 0224 08/16/16 0859  TROPONINI 0.89* 0.71*   BNP (last 3 results) No results for input(s): PROBNP in the last 8760 hours. HbA1C: No results for input(s): HGBA1C in the last 72 hours. CBG:  Recent Labs Lab 08/16/16 0827 08/17/16 0632 08/18/16 1102  GLUCAP 128* 108* 145*   Lipid Profile: No results for input(s): CHOL, HDL, LDLCALC, TRIG, CHOLHDL, LDLDIRECT in the last 72 hours. Thyroid Function Tests: No results for input(s): TSH, T4TOTAL, FREET4, T3FREE, THYROIDAB in the last 72 hours. Anemia Panel: No results for input(s): VITAMINB12, FOLATE, FERRITIN, TIBC, IRON, RETICCTPCT in the last 72 hours. Sepsis Labs:  Recent Labs Lab 08/16/16 0859  LATICACIDVEN 1.4    Recent Results (from the past 240 hour(s))  Culture, blood (Routine X 2) w Reflex to ID Panel     Status: None (Preliminary  result)   Collection Time: 08/16/16 12:05 AM  Result Value Ref Range Status   Specimen Description BLOOD LEFT HAND  Final   Special Requests BOTTLES DRAWN AEROBIC ONLY 5CC  Final   Culture NO GROWTH 2 DAYS  Final   Report Status PENDING  Incomplete  Culture, blood (Routine X 2) w Reflex to ID Panel     Status: None (Preliminary result)   Collection Time: 08/16/16 12:15 AM  Result Value Ref Range Status   Specimen Description BLOOD RIGHT ARM  Final   Special Requests BOTTLES DRAWN AEROBIC ONLY 5CC  Final  Culture NO GROWTH 2 DAYS  Final   Report Status PENDING  Incomplete       Radiology Studies: Mr Jodene Nam Neck W Wo Contrast  Result Date: 08/17/2016 CLINICAL DATA:  Hypoxia.  Acute infarct seen on CT. EXAM: MR HEAD WITHOUT CONTRAST MR CIRCLE OF WILLIS WITHOUT CONTRAST MRA OF THE NECK WITHOUT AND WITH CONTRAST TECHNIQUE: Multiplanar, multiecho pulse sequences of the brain, circle of willis and surrounding structures were obtained without intravenous contrast. Angiographic images of the neck were obtained using MRA technique without and with intravenous contrast. CONTRAST:  9m MULTIHANCE GADOBENATE DIMEGLUMINE 529 MG/ML IV SOLN COMPARISON:  Head CT 08/16/2016 FINDINGS: MR HEAD FINDINGS Brain: There is diffusion restriction throughout the entire right anterior and middle cerebral artery territories. Additionally, there are scattered foci of acute ischemia within the left frontal white matter and both occipital lobes. There is cytotoxic edema with resultant hyperintense T2 weighted signal throughout the right ACA and MCA distributions. There is no midline shift. There is mild narrowing of the right lateral ventricle. No hydrocephalus or extra-axial fluid collection. The midline structures are normal. No age advanced or lobar predominant atrophy. No hemorrhage. Vascular: There is loss of the normal right middle and anterior cerebral artery flow voids. There are scattered foci of hyperintense T2  weighted signal within the subcortical and deep white matter of the left hemisphere, compatible with chronic microvascular ischemia. No evidence of chronic microhemorrhage or amyloid angiopathy. Skull and upper cervical spine: The visualized skull base, calvarium, upper cervical spine and extracranial soft tissues are normal. Sinuses/Orbits: No fluid levels or advanced mucosal thickening. No mastoid effusion. Normal orbits. MR CIRCLE OF WILLIS FINDINGS Intracranial internal carotid arteries: There is diminished flow related enhancement within the supraclinoid right internal carotid artery. The left ICA is normal. Anterior cerebral arteries: There is complete loss of flow related enhancement of the right anterior cerebral artery A1 segment. There is a diminutive A2 segments supplied by the anterior communicating artery. The left anterior cerebral artery is normal. Middle cerebral arteries: The right middle cerebral artery is occluded at its origin and no flow related enhancement is seen within its distal vascular territory. The left middle cerebral artery is normal. Posterior communicating arteries: Present on the right. Posterior cerebral arteries: Normal. Basilar artery: Normal. Vertebral arteries: Left dominant. Normal. Superior cerebellar arteries: Normal. Anterior inferior cerebellar arteries: Normal. Posterior inferior cerebellar arteries: Left PICA is not visualized. The right PICA is normal. MRA NECK FINDINGS There is a normal variant aortic arch branching pattern with the brachiocephalic and left common carotid arteries sharing a common origin. On the time-of-flight images of the neck, there is loss of flow related enhancement within the right internal carotid artery at the level of the oropharynx. On the contrast-enhanced MRA images, however, there is contrast enhancement of the segments of the right internal carotid artery. The left common and internal carotid arteries are normal. Both vertebral arteries  arise from their respective subclavian arteries without origin stenosis. The vertebral system is mildly left dominant. The cervical segments of the vertebral arteries are normal. Limited imaging of the soft tissues of the neck is unremarkable. IMPRESSION: 1. 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 No midline shift or acute hemorrhage. 2. Multiple scattered small (3-5 mm) foci of acute ischemia within the left frontal white matter and both occipital lobes. Bilateral distribution suggests a central cardioembolic source. 3. 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  The right ACA A2 segment is at least partially supplied via the anterior communicating artery. 4. Time of flight images of the neck show loss of normal flow-related enhancement in the mid-to-distal right ICA; however, the post-contrast images show normal enhancement. Taken together, this probably indicates slow flow within this segment of the right ICA. 5. MRA of the neck is otherwise normal. Electronically Signed   By: Ulyses Jarred M.D.   On: 08/17/2016 00:21   Mr Brain Wo Contrast  Result Date: 08/17/2016 CLINICAL DATA:  Hypoxia.  Acute infarct seen on CT. EXAM: MR HEAD WITHOUT CONTRAST MR CIRCLE OF WILLIS WITHOUT CONTRAST MRA OF THE NECK WITHOUT AND WITH CONTRAST TECHNIQUE: Multiplanar, multiecho pulse sequences of the brain, circle of willis and surrounding structures were obtained without intravenous contrast. Angiographic images of the neck were obtained using MRA technique without and with intravenous contrast. CONTRAST:  16m MULTIHANCE GADOBENATE DIMEGLUMINE 529 MG/ML IV SOLN COMPARISON:  Head CT 08/16/2016 FINDINGS: MR HEAD FINDINGS Brain: There is diffusion restriction throughout the entire right anterior and middle cerebral artery territories. Additionally, there are scattered foci of acute ischemia within the left frontal white  matter and both occipital lobes. There is cytotoxic edema with resultant hyperintense T2 weighted signal throughout the right ACA and MCA distributions. There is no midline shift. There is mild narrowing of the right lateral ventricle. No hydrocephalus or extra-axial fluid collection. The midline structures are normal. No age advanced or lobar predominant atrophy. No hemorrhage. Vascular: There is loss of the normal right middle and anterior cerebral artery flow voids. There are scattered foci of hyperintense T2 weighted signal within the subcortical and deep white matter of the left hemisphere, compatible with chronic microvascular ischemia. No evidence of chronic microhemorrhage or amyloid angiopathy. Skull and upper cervical spine: The visualized skull base, calvarium, upper cervical spine and extracranial soft tissues are normal. Sinuses/Orbits: No fluid levels or advanced mucosal thickening. No mastoid effusion. Normal orbits. MR CIRCLE OF WILLIS FINDINGS Intracranial internal carotid arteries: There is diminished flow related enhancement within the supraclinoid right internal carotid artery. The left ICA is normal. Anterior cerebral arteries: There is complete loss of flow related enhancement of the right anterior cerebral artery A1 segment. There is a diminutive A2 segments supplied by the anterior communicating artery. The left anterior cerebral artery is normal. Middle cerebral arteries: The right middle cerebral artery is occluded at its origin and no flow related enhancement is seen within its distal vascular territory. The left middle cerebral artery is normal. Posterior communicating arteries: Present on the right. Posterior cerebral arteries: Normal. Basilar artery: Normal. Vertebral arteries: Left dominant. Normal. Superior cerebellar arteries: Normal. Anterior inferior cerebellar arteries: Normal. Posterior inferior cerebellar arteries: Left PICA is not visualized. The right PICA is normal. MRA NECK  FINDINGS There is a normal variant aortic arch branching pattern with the brachiocephalic and left common carotid arteries sharing a common origin. On the time-of-flight images of the neck, there is loss of flow related enhancement within the right internal carotid artery at the level of the oropharynx. On the contrast-enhanced MRA images, however, there is contrast enhancement of the segments of the right internal carotid artery. The left common and internal carotid arteries are normal. Both vertebral arteries arise from their respective subclavian arteries without origin stenosis. The vertebral system is mildly left dominant. The cervical segments of the vertebral arteries are normal. Limited imaging of the soft tissues of the neck is unremarkable. IMPRESSION: 1. Large right-sided acute infarct involving the entirety  of the right middle and anterior cerebral artery territories with associated cytotoxic edema. No midline shift or acute hemorrhage. 2. Multiple scattered small (3-5 mm) foci of acute ischemia within the left frontal white matter and both occipital lobes. Bilateral distribution suggests a central cardioembolic source. 3. 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 The right ACA A2 segment is at least partially supplied via the anterior communicating artery. 4. Time of flight images of the neck show loss of normal flow-related enhancement in the mid-to-distal right ICA; however, the post-contrast images show normal enhancement. Taken together, this probably indicates slow flow within this segment of the right ICA. 5. MRA of the neck is otherwise normal. Electronically Signed   By: Ulyses Jarred M.D.   On: 08/17/2016 00:21   Mr Jodene Nam Headm  Result Date: 08/17/2016 CLINICAL DATA:  Hypoxia.  Acute infarct seen on CT. EXAM: MR HEAD WITHOUT CONTRAST MR CIRCLE OF WILLIS WITHOUT CONTRAST MRA OF THE NECK WITHOUT AND WITH CONTRAST TECHNIQUE: Multiplanar, multiecho pulse sequences of the brain, circle of  willis and surrounding structures were obtained without intravenous contrast. Angiographic images of the neck were obtained using MRA technique without and with intravenous contrast. CONTRAST:  48m MULTIHANCE GADOBENATE DIMEGLUMINE 529 MG/ML IV SOLN COMPARISON:  Head CT 08/16/2016 FINDINGS: MR HEAD FINDINGS Brain: There is diffusion restriction throughout the entire right anterior and middle cerebral artery territories. Additionally, there are scattered foci of acute ischemia within the left frontal white matter and both occipital lobes. There is cytotoxic edema with resultant hyperintense T2 weighted signal throughout the right ACA and MCA distributions. There is no midline shift. There is mild narrowing of the right lateral ventricle. No hydrocephalus or extra-axial fluid collection. The midline structures are normal. No age advanced or lobar predominant atrophy. No hemorrhage. Vascular: There is loss of the normal right middle and anterior cerebral artery flow voids. There are scattered foci of hyperintense T2 weighted signal within the subcortical and deep white matter of the left hemisphere, compatible with chronic microvascular ischemia. No evidence of chronic microhemorrhage or amyloid angiopathy. Skull and upper cervical spine: The visualized skull base, calvarium, upper cervical spine and extracranial soft tissues are normal. Sinuses/Orbits: No fluid levels or advanced mucosal thickening. No mastoid effusion. Normal orbits. MR CIRCLE OF WILLIS FINDINGS Intracranial internal carotid arteries: There is diminished flow related enhancement within the supraclinoid right internal carotid artery. The left ICA is normal. Anterior cerebral arteries: There is complete loss of flow related enhancement of the right anterior cerebral artery A1 segment. There is a diminutive A2 segments supplied by the anterior communicating artery. The left anterior cerebral artery is normal. Middle cerebral arteries: The right middle  cerebral artery is occluded at its origin and no flow related enhancement is seen within its distal vascular territory. The left middle cerebral artery is normal. Posterior communicating arteries: Present on the right. Posterior cerebral arteries: Normal. Basilar artery: Normal. Vertebral arteries: Left dominant. Normal. Superior cerebellar arteries: Normal. Anterior inferior cerebellar arteries: Normal. Posterior inferior cerebellar arteries: Left PICA is not visualized. The right PICA is normal. MRA NECK FINDINGS There is a normal variant aortic arch branching pattern with the brachiocephalic and left common carotid arteries sharing a common origin. On the time-of-flight images of the neck, there is loss of flow related enhancement within the right internal carotid artery at the level of the oropharynx. On the contrast-enhanced MRA images, however, there is contrast enhancement of the segments of the right internal carotid artery. The left common  and internal carotid arteries are normal. Both vertebral arteries arise from their respective subclavian arteries without origin stenosis. The vertebral system is mildly left dominant. The cervical segments of the vertebral arteries are normal. Limited imaging of the soft tissues of the neck is unremarkable. IMPRESSION: 1. 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 No midline shift or acute hemorrhage. 2. Multiple scattered small (3-5 mm) foci of acute ischemia within the left frontal white matter and both occipital lobes. Bilateral distribution suggests a central cardioembolic source. 3. 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 The right ACA A2 segment is at least partially supplied via the anterior communicating artery. 4. Time of flight images of the neck show loss of normal flow-related enhancement in the mid-to-distal right ICA; however, the post-contrast  images show normal enhancement. Taken together, this probably indicates slow flow within this segment of the right ICA. 5. MRA of the neck is otherwise normal. Electronically Signed   By: Ulyses Jarred M.D.   On: 08/17/2016 00:21      Scheduled Meds: . sodium chloride flush  3 mL Intravenous Q12H   Continuous Infusions: . heparin 1,150 Units/hr (08/18/16 0818)     LOS: 3 days    Time spent: 30 minutes   Refugio Mcconico, DO [facility] [email or web address] Password [facility] 08/18/2016, 7:09 PM

## 2016-08-18 NOTE — Progress Notes (Signed)
ANTICOAGULATION CONSULT NOTE - Follow Up Consult  Pharmacy Consult for Heparin Indication: pulmonary embolus  No Known Allergies  Patient Measurements: Height: 6\' 2"  (188 cm) Weight: 154 lb 8 oz (70.1 kg) IBW/kg (Calculated) : 82.2 Heparin Dosing Weight:  77.1 kg  Vital Signs: Temp: 98.5 F (36.9 C) (03/21 0513) Temp Source: Axillary (03/21 0513) BP: 139/92 (03/21 0513) Pulse Rate: 83 (03/21 0513)  Labs:  Recent Labs  08/16/16 0224 08/16/16 0845 08/16/16 0859  08/16/16 1800 08/17/16 0222 08/17/16 1202 08/17/16 2134 08/18/16 0619  HGB 9.3* 9.8*  --   --   --  9.5*  --   --  9.1*  HCT 28.8* 30.3*  --   --   --  29.1*  --   --  28.8*  PLT 191 181  --   --   --  192  --   --  201  APTT  --   --   --   < > 37* 97* 121* 97* 66*  LABPROT  --  23.7*  --   --   --   --   --   --   --   INR  --  2.08  --   --   --   --   --   --   --   HEPARINUNFRC  --   --   --   --  >2.20*  --  1.52*  --  0.80*  CREATININE  --  0.65  --   --   --  0.72  --   --  0.59*  TROPONINI 0.89*  --  0.71*  --   --   --   --   --   --   < > = values in this interval not displayed. Estimated Creatinine Clearance: 81.5 mL/min (A) (by C-G formula based on SCr of 0.59 mg/dL (L)).  Assessment: 64 YOM with recent saddle PE on Eliquis PTA. CT of head 3/10 shows acute ischemic stroke involving right ACA and right MCA territories. Patient NPO after failing swallow exam, and oncology recommends NOT interrupting anticoagulation for feeding tube placement. Will continue to dose IV heparin using aPTT as heparin level not correlating properly. aPTT (66) at the low end on therapeutic goal this morning, given large clot burden, will increase rate slightly to prevent subtherapeutic levels. CBC stable with no bleeding reported at this time.   Goal of Therapy:  aPTT goal 66-85 Heparin level 0.3-0.5 units/ml   Monitor platelets by anticoagulation protocol: Yes   Plan:  Increase heparin gtt to 1150 units/hr F/u 8 hr  aPTT Monitor for s/sx of bleeding  Georga Bora, PharmD Clinical Pharmacist Pager: [phone] 08/18/2016 8:06 AM

## 2016-08-18 NOTE — Progress Notes (Signed)
STROKE TEAM PROGRESS NOTE   SUBJECTIVE (INTERVAL HISTORY) Son at bedside. He reports pt opened his eyes once this am. States cancer doctor came yesterday and also recommends palliative care. Rest of family are discussion. Pt lying in bed, occasional spontaneous movement of RUE.    OBJECTIVE Temp:  [98.5 F (36.9 C)-99.5 F (37.5 C)] 98.5 F (36.9 C) (03/21 0513) Pulse Rate:  [83-104] 83 (03/21 0513) Cardiac Rhythm: Normal sinus rhythm (03/21 0840) Resp:  [20-28] 20 (03/21 0513) BP: (139-154)/(73-92) 139/92 (03/21 0513) SpO2:  [98 %-100 %] 100 % (03/21 0513)  CBC:  Recent Labs Lab 08/16/16 0224  08/17/16 0222 08/18/16 0619  WBC 17.1*  < > 18.7* 16.9*  NEUTROABS 12.9*  --   --   --   HGB 9.3*  < > 9.5* 9.1*  HCT 28.8*  < > 29.1* 28.8*  MCV 83.2  < > 82.9 83.7  PLT 191  < > 192 201  < > = values in this interval not displayed.  Basic Metabolic Panel:   Recent Labs Lab 08/16/16 0845 08/17/16 0222 08/18/16 0619  NA 133* 133* 134*  K 4.1 3.4* 4.0  CL 104 101 106  CO2 20* 22 22  GLUCOSE 132* 108* 133*  BUN 6 7 7   CREATININE 0.65 0.72 0.59*  CALCIUM 8.3* 8.3* 8.1*  MG 1.7  --  1.7  PHOS 3.2  --   --     Lipid Panel:     Component Value Date/Time   CHOL 110 08/06/2016 0811   TRIG 99 08/06/2016 0811   HDL 20 (L) 08/06/2016 0811   CHOLHDL 5.5 08/06/2016 0811   VLDL 20 08/06/2016 0811   LDLCALC 70 08/06/2016 0811    PHYSICAL EXAM Frail elderly african male not in distress. . Afebrile. Head is nontraumatic. Neck is supple without bruit.    Cardiac exam no murmur or gallop. Lungs are clear to auscultation. Distal pulses are well felt. Neurological Exam :  Drowsy but opens eyes and follows fe midline and right sided commands. Right gaze preference and unable to look to left side. Pupils equal and reactive.fundi not visualized. Blinks to threat on right but not on left side. Left lower face weakness. Tongue midline. Dense left hemiplegia with hypotonia. Purposeful  movements right UE nd withdrawas to pain in RLE. ASSESSMENT/PLAN Mr. Ferris Mcinroy is a 71 y.o. male with history of prostate cancer status post resection, metastatic pancreatic cancer with liver mets, recent diagnosis of saddle PE with heart strain discharged from the hospital on 08/10/2016 with Eliquis presenting with hypoxia which progressed to unresponsiveness. CT head showed large right hemispheric stroke. He did not receive IV t-PA due to delay in arrival, on eliquis, large stroke.    Stroke:  Large R MCA/ACA territory infarct with cytotoxic edema and multiple L frontal white matter and occipital lobe infarcts. Infarcts embolic secondary to hypercoagulable state from metastatic pancreatic cancer  CT head large R ACA and MCA infarct with extensive cytotoxic edema. Effacement of the ventricles on the right. No midline shift. Atrophy. Small vessel disease. Acute thrombus in R ICA to R MCA suspected. Aortic vascular calcification. Paranasal sinus disease.   MRI  large right MCA and ACA infarct with cytotoxic edema. Multiple L frontal white matter and occipital lobe infarcts   MRA head occluded R MCA and are ACA A1   MRA neck loss of flow mid to distal R ICA (felt to be slow flow), otherwise normal  2D Echo  Pending. Echo done  08/05/2016 with EF 55-60%, no SOE. Diameter 39. sm pericardial effusion.  LDL 70  HgbA1c 6.2  On IV heparin for VTE prophylaxis Diet NPO time specified  Eliquis (apixaban) daily prior to admission, now on heparin IV  Palliative care recommended. Family is discussing  Therapy recommendations:  pending   Disposition:  pending   Hypertension  Stable  Permissive hypertension (OK if < 220/120) but gradually normalize in 5-7 days  Long-term BP goal normotensive  Hyperlipidemia  Home meds:  No stastin  LDL at 70, just above goal  Not a statin candidate secondary to liver metastasis  Other Stroke Risk Factors  Advanced age   UDS not performed  Other  Active Problems  Pancreatis mass with liver metastasis  Saddle PE on eliquis with heart strain, changed to IV heparin, recent d/c 3.13.18  Hypoxic respiratory failure  SIRS  Elevated troponin  Chronic normocytic anemia  Hypokalemia   NOTHING FURTHER TO ADD FROM THE STROKE STANDPOINT  Patient has a 10-15% risk of having another stroke over the next year, the highest risk is within 2 weeks of the most recent stroke/TIA (risk of having a stroke following a stroke or TIA is the same).  Stroke Service will sign off. Please call should any needs arise.  Hospital day # [place] for Pager information 08/18/2016 10:09 AM  I have personally examined this patient, reviewed notes, independently viewed imaging studies, participated in medical decision making and plan of care.ROS completed by me personally and pertinent positives fully documented  I have made any additions or clarifications directly to the above note. Agree with note above.   Antony Contras, MD Medical Director [facility] Pager: [phone] 08/18/2016 4:23 PM  To contact Stroke Continuity provider, please refer to [email or web address] After hours, contact General Neurology

## 2016-08-18 NOTE — Progress Notes (Signed)
OT Cancellation Note  Patient Details Name: Jared Clements MRN: [id number] DOB: 05-27-1946   Cancelled Treatment:    Reason Eval/Treat Not Completed: Patient not medically ready. Per nursing, pt not appropriate for for OT evaluation at this time. Will continue to follow and check back as appropriate.   Norman Herrlich, MS OTR/L  Pager: [phone]   Norman Herrlich 08/18/2016, 9:26 AM

## 2016-08-18 NOTE — Progress Notes (Signed)
  Speech Language Pathology Treatment: Dysphagia  Patient Details Name: Jared Clements MRN: [id number] DOB: 1946/01/19 Today's Date: 08/18/2016 Time: 2423-5361 SLP Time Calculation (min) (ACUTE ONLY): 21 min  Assessment / Plan / Recommendation Clinical Impression  Pt is drowsy with minimal labial movement observed during completion of oral care via toothbrush and suction. SLP attempted ice chip trials but pt made no attempts to take the ice into his mouth despite SLP cueing and use of family as therapeutic agents. Overall, he does not appear to respond to PO offerings, and therefore he is not safe to initiate any POs. Discussed this with his family. Will continue to follow as family tries to make decisions regarding [place] as pt remains nutritionally unsupported.    HPI HPI: Ptis a 71 y.o.malewith PMH significant for prostate cancer status post resection, pancreatic and liver masses status post recent biopsy, and recent diagnosis of acute saddle PE with heart strain, discharge from the hospital on 08/10/2016 with Eliquisand with follow-up with surgery regarding the biopsy. Patient reports that he has continued to have a productive cough and dyspnea at home. CXR showed no radiographic evidence of acute cardiopulmonary disease. CT of head showed Evidence of acute infarct involving the anterior and middle cerebral arteries on the right with extensive brain parenchymal involvement on the right as summarized above. No hemorrhage.       SLP Plan  Continue with current plan of care       Recommendations  Diet recommendations: NPO Medication Administration: Via alternative means                Oral Care Recommendations: Oral care QID Follow up Recommendations: Skilled Nursing facility SLP Visit Diagnosis: Dysphagia, unspecified (R13.10) Plan: Continue with current plan of care       GO                Germain Osgood 08/18/2016, 4:33 PM  Germain Osgood, M.A.  CCC-SLP [phone]

## 2016-08-18 NOTE — Progress Notes (Signed)
ANTICOAGULATION CONSULT NOTE - Follow Up Consult  Pharmacy Consult for Heparin Indication: pulmonary embolus  No Known Allergies  Patient Measurements: Height: 6\' 2"  (188 cm) Weight: 154 lb 8 oz (70.1 kg) IBW/kg (Calculated) : 82.2 Heparin Dosing Weight:  77.1 kg  Vital Signs: Temp: 98.1 F (36.7 C) (03/21 1518) Temp Source: Oral (03/21 1518) BP: 163/77 (03/21 1518) Pulse Rate: 105 (03/21 1518)  Labs:  Recent Labs  08/16/16 0224 08/16/16 0845 08/16/16 0859  08/16/16 1800 08/17/16 0222 08/17/16 1202 08/17/16 2134 08/18/16 0619 08/18/16 1546  HGB 9.3* 9.8*  --   --   --  9.5*  --   --  9.1*  --   HCT 28.8* 30.3*  --   --   --  29.1*  --   --  28.8*  --   PLT 191 181  --   --   --  192  --   --  201  --   APTT  --   --   --   < > 37* 97* 121* 97* 66* 80*  LABPROT  --  23.7*  --   --   --   --   --   --   --   --   INR  --  2.08  --   --   --   --   --   --   --   --   HEPARINUNFRC  --   --   --   --  >2.20*  --  1.52*  --  0.80*  --   CREATININE  --  0.65  --   --   --  0.72  --   --  0.59*  --   TROPONINI 0.89*  --  0.71*  --   --   --   --   --   --   --   < > = values in this interval not displayed. Estimated Creatinine Clearance: 81.5 mL/min (A) (by C-G formula based on SCr of 0.59 mg/dL (L)).  Assessment: 29 YOM with recent saddle PE on Eliquis PTA. CT of head 3/10 shows acute ischemic stroke involving right ACA and right MCA territories. Patient NPO after failing swallow exam, and oncology recommends NOT interrupting anticoagulation for feeding tube placement. Will continue to dose IV heparin using aPTT as heparin level not correlating properly.  APTT therapeutic at 80 this evening, no bleeding reported at this time.   Goal of Therapy:  aPTT goal 66-85 Heparin level 0.3-0.5 units/ml   Monitor platelets by anticoagulation protocol: Yes   Plan:  Continue heparin gtt to 1150 units/hr F/u daily aPTT and heparin level until correlating Monitor for s/sx of  bleeding  Thank you for allowing [place] to participate in this patients care. Jens Som, PharmD Clinical phone # [phone] Main pharmacy phone # [phone]

## 2016-08-18 NOTE — Progress Notes (Signed)
PT Cancellation Note  Patient Details Name: Zebulon Schroll MRN: [id number] DOB: Apr 07, 1946   Cancelled Treatment:    Reason Eval/Treat Not Completed: Patient not medically ready. Nursing reports that the pt is not appropriate for PT services at this time. Will follow.    Cassell Clement, PT, CSCS Pager [phone] Office [phone]  08/18/2016, 8:57 AM

## 2016-08-18 NOTE — Progress Notes (Signed)
Palliative Medicine RN Note: Delivered letter to [employer] for a work excuse.  Marjie Skiff Dema Timmons, RN, BSN, [facility] 08/18/2016 1:32 PM Cell [phone] 8:00-4:00 Monday-Friday Office [phone]

## 2016-08-19 DIAGNOSIS — I63231 Cerebral infarction due to unspecified occlusion or stenosis of right carotid arteries: Secondary | ICD-10-CM | POA: Diagnosis not present

## 2016-08-19 DIAGNOSIS — I639 Cerebral infarction, unspecified: Secondary | ICD-10-CM | POA: Diagnosis not present

## 2016-08-19 DIAGNOSIS — R748 Abnormal levels of other serum enzymes: Secondary | ICD-10-CM | POA: Diagnosis not present

## 2016-08-19 DIAGNOSIS — J9601 Acute respiratory failure with hypoxia: Secondary | ICD-10-CM | POA: Diagnosis not present

## 2016-08-19 DIAGNOSIS — R0600 Dyspnea, unspecified: Secondary | ICD-10-CM | POA: Diagnosis not present

## 2016-08-19 DIAGNOSIS — Z515 Encounter for palliative care: Secondary | ICD-10-CM | POA: Diagnosis not present

## 2016-08-19 DIAGNOSIS — C787 Secondary malignant neoplasm of liver and intrahepatic bile duct: Secondary | ICD-10-CM | POA: Diagnosis not present

## 2016-08-19 DIAGNOSIS — Z7189 Other specified counseling: Secondary | ICD-10-CM | POA: Diagnosis not present

## 2016-08-19 DIAGNOSIS — C259 Malignant neoplasm of pancreas, unspecified: Secondary | ICD-10-CM | POA: Diagnosis not present

## 2016-08-19 DIAGNOSIS — G9340 Encephalopathy, unspecified: Secondary | ICD-10-CM | POA: Diagnosis not present

## 2016-08-19 LAB — CBC
HCT: 30.6 % — ABNORMAL LOW (ref 39.0–52.0)
Hemoglobin: 9.6 g/dL — ABNORMAL LOW (ref 13.0–17.0)
MCH: 26.2 pg (ref 26.0–34.0)
MCHC: 31.4 g/dL (ref 30.0–36.0)
MCV: 83.4 fL (ref 78.0–100.0)
PLATELETS: 239 10*3/uL (ref 150–400)
RBC: 3.67 MIL/uL — AB (ref 4.22–5.81)
RDW: 15.6 % — ABNORMAL HIGH (ref 11.5–15.5)
WBC: 17.2 10*3/uL — AB (ref 4.0–10.5)

## 2016-08-19 LAB — APTT: APTT: 80 s — AB (ref 24–36)

## 2016-08-19 LAB — HEPARIN LEVEL (UNFRACTIONATED): HEPARIN UNFRACTIONATED: 0.59 [IU]/mL (ref 0.30–0.70)

## 2016-08-19 NOTE — Progress Notes (Signed)
Occupational Therapy Evaluation Patient Details Name: Jared Clements MRN: [id number] DOB: 1946/02/14 Today's Date: 08/19/2016    History of Present Illness 71 y.o. male with medical history significant for prostate cancer status post resection, pancreatic and liver masses status post recent biopsy, and recent diagnosis of acute saddle PE with heart strain, discharge from the hospital on 08/10/2016 with Eliquis and with follow-up with surgery regarding the biopsy.  Admitted to dyspnea adn wosening cough. On 3/19, patient was noted to be somnolent and obtunded. Stat CT of the head revealed a large right-sided stroke -Large R MCA/ACA territory infarct with cytotoxic edema and multiple L frontal white matter and occipital lobe infarcts.   Clinical Impression   PTA, family reports pt as being independent with ADL and mobility. 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 Pt with significant functional status change due to deficits noted below. Pt currently requires total A with all mobility and ADL. Pt not  following commands in sitting. R gaze preference. No visual tracking. Kept L eye closed throughout session.  No righting rxn in sitting.  Apparent L neglect. No active movement of LU/LE during assessment. Lethargic. Held RUE up against gravity in supine, but unsure if pt was following command.  Family present. Began education regarding role of OT to teach family BUE ROM and positioning to reduce likelihood of skin breakdown and contracture development. Also educated family that pt would be complete total care. Will follow acutely for family education to reduce burden of care.  Discussed with palliative prior to assessment.    Follow Up Recommendations  Supervision/Assistance - 24 hour    Equipment Recommendations  Hospital bed;Other (comment) (hoyer) if pt progresses, will need w/c   Recommendations for Other Services       Precautions / Restrictions Precautions Precautions: Fall;Other (comment) (At  risk for skin breakdown; at risk for L subluxed shoulder) Restrictions Weight Bearing Restrictions: No      Mobility Bed Mobility Overal bed mobility: Needs Assistance Bed Mobility: Rolling;Sidelying to Sit;Sit to Supine Rolling: Total assist Sidelying to sit: Total assist;+2 for physical assistance   Sit to supine: +2 for physical assistance;Total assist   General bed mobility comments: no initiation of movement  Transfers                 General transfer comment: not attempted at this time    Balance Overall balance assessment: Needs assistance   Sitting balance-Leahy Scale: Zero   Postural control: Left lateral lean;Posterior lean                                  ADL Overall ADL's : Needs assistance/impaired                                     Functional mobility during ADLs: Total assistance;+2 for physical assistance General ADL Comments: total A for all ADL     Vision   Vision Assessment?: Vision impaired- to be further tested in functional context Additional Comments: L eye closed throughout session. R eye deviated to R. Would not visually attend at midline. No tracking observed.     Perception Perception Spatial deficits: L neglect   Praxis Praxis Praxis tested?: Deficits Deficits: Initiation Praxis-Other Comments: will further assess. No initiaon of movement on command    Pertinent Vitals/Pain Pain Assessment: Faces Faces Pain Scale: No hurt  Hand Dominance Left (per chart)   Extremity/Trunk Assessment Upper Extremity Assessment Upper Extremity Assessment: RUE deficits/detail;LUE deficits/detail RUE Deficits / Details: moving RUE spontaneously, but not necessarily to command; able to lift to touch head in supine. Did not actively lift in sitting. LUE Deficits / Details: no active movement observed. Appears flaccid. No increase in tone   Lower Extremity Assessment Lower Extremity Assessment: Defer to PT  evaluation   Cervical / Trunk Assessment Cervical / Trunk Assessment: Other exceptions Cervical / Trunk Exceptions: posterior pelvic tilt; L lateral lean. forward head turned to R; no righting rxn observed. 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 Possibly followed command to hold up R arm. Family also asking pt to follow commands in his native language - no command follow.    General Comments       Exercises       Shoulder Instructions      Home Living Family/patient expects to be discharged to:: Unsure Living Arrangements: Children Available Help at Discharge: Family                                    Prior Functioning/Environment Level of Independence: Independent                 OT Problem List: Decreased strength;Decreased range of motion;Impaired balance (sitting and/or standing);Decreased activity tolerance;Impaired vision/perception;Decreased coordination;Decreased cognition;Decreased safety awareness;Decreased knowledge of use of DME or AE;Decreased knowledge of precautions;Cardiopulmonary status limiting activity;Impaired sensation;Impaired tone;Impaired UE functional use      OT Treatment/Interventions: Self-care/ADL training;DME and/or AE instruction;Therapeutic activities;Patient/family education;Other (comment) (to reduce burden of care)    OT Goals(Current goals can be found in the care plan section) Acute Rehab OT Goals Patient Stated Goal: unable to state OT Goal Formulation: With  family Time For Goal Achievement: 09/02/16 Potential to Achieve Goals: Good ADL Goals Pt/caregiver will Perform Home Exercise Program: Increased ROM;Both right and left upper extremity;With written HEP provided (Family to complete BUE AA (R) and PROM LUE to reduce pressur) Additional ADL Goal #1: Family will demonstrate understanding of positioning to reduce risk of pressure Additional ADL Goal #2: Pt will follow single step command in nondistracting environemtn with mod vc  OT Frequency: Min 2X/week   Barriers to D/C:            Co-evaluation   Reason for Co-Treatment: For patient/therapist safety;Complexity of the patient's impairments (multi-system involvement)          End of Session Equipment Utilized During Treatment: Oxygen Nurse Communication: Mobility status  Activity Tolerance: Patient tolerated treatment well Patient left: in bed;with call bell/phone within reach;with family/visitor present  OT Visit Diagnosis: Muscle weakness (generalized) (M62.81);Other symptoms and signs involving the nervous system (R29.898);Hemiplegia and hemiparesis Hemiplegia - Right/Left: Left Hemiplegia - dominant/non-dominant: Dominant Hemiplegia - caused by: Cerebral infarction                ADL either performed or assessed with clinical judgement  Time: 4196-2229 OT Time Calculation (min): 31 min Charges:  OT General Charges $OT Visit:  1 Procedure G-Codes:     [facility], OT/L  [phone] 08/19/2016  Lantz Hermann,HILLARY 08/19/2016, 2:22 PM

## 2016-08-19 NOTE — Progress Notes (Signed)
  Speech Language Pathology Treatment: Dysphagia  Patient Details Name: Jared Clements MRN: [id number] DOB: [birth year] Today's Date: 08/19/2016 Time: 1093-2355 SLP Time Calculation (min) (ACUTE ONLY): 14 min  Assessment / Plan / Recommendation Clinical Impression  Pt is more lethargic this afternoon, opening his eyes less than during SLP visit on previous date. SLP provided repositioning, multimodal stimulation, and oral care with toothbrush and suction but with minimal increase in alertness. Pt quickyl drifted back to sleep. Ice chips were applied to his outer lips but with no response. Reiterated with family that pt's mentation still does not allow for safe PO intake. Reinforced the need for frequent oral care, at least 4x a day with a toothbrush and suction (his mouth may dry out more quickly as he breaths with an open mouth posture).    HPI HPI: Ptis a 71 y.o.malewith PMH significant for prostate cancer status post resection, pancreatic and liver masses status post recent biopsy, and recent diagnosis of acute saddle PE with heart strain, discharge from the hospital on 08/10/2016 with Eliquisand with follow-up with surgery regarding the biopsy. Patient reports that he has continued to have a productive cough and dyspnea at home. CXR showed no radiographic evidence of acute cardiopulmonary disease. CT of head showed Evidence of acute infarct involving the anterior and middle cerebral arteries on the right with extensive brain parenchymal involvement on the right as summarized above. No hemorrhage.       SLP Plan  Continue with current plan of care       Recommendations  Diet recommendations: NPO Medication Administration: Via alternative means                Oral Care Recommendations: Oral care QID Follow up Recommendations: Home health SLP;24 hour supervision/assistance SLP Visit Diagnosis: Dysphagia, unspecified (R13.10) Plan: Continue with current plan of care       GO                 Jared Clements 08/19/2016, 5:51 PM  Jared Clements, M.A. CCC-SLP [phone]

## 2016-08-19 NOTE — Progress Notes (Signed)
PROGRESS NOTE    Jared Clements  VHQ:[id number] DOB: 1945-09-04 DOA: 08/15/2016 PCP: Harvie Junior, MD     Brief Narrative:  Jared Clements is a 71 y.o. male with medical history significant for prostate cancer status post resection, pancreatic and liver masses status post recent biopsy, and recent diagnosis of acute saddle PE with heart strain, discharge from the hospital on 08/10/2016 with Eliquis and with follow-up with surgery regarding the biopsy. Patient reports that he continued to have a cough and dyspnea at home and describes his chest pain as slowly improving. He has been dyspneic with minimal exertion, but feels that this is fairly stable from time of recent discharge. He denies fevers or chills and denies leg swelling or orthopnea. He reports continued adherence with the anticoagulant. Home health nurse visited him this afternoon, noted him to be hypoxic, and directed him to the emergency department for further evaluation. He was admitted due to acute hypoxemic respiratory failure, due to recent PE.   On 3/19, patient was noted to be somnolent and obtunded. Stat CT of the head revealed a large right-sided stroke. Stroke team was consulted. He was out of the window for tPA. He continues to be somnolent, although does awaken to voice and follows simple commands.  Oncology recommends consideration to transitioning to comfort care only and so does neurology. Palliative care on board. Patient's family planning to meet this weekend to further discuss goals of care.  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 Has been adherent to Eliquis. Repeat CTA chest with improvement in PE and RV-to-LV ratio. Did not meet home O2 requirement at time of previous discharge. Returned to hospital with chief complaint of dyspnea.  -Continue [place] O2  Acute encephalopathy likely due to large right ACA and MCA stroke, multiple small foci of acute ischemic left frontal and bilateral occipital lobes -Bilateral distribution suggesting cardioembolic source -No previous hx of A Fib, currently sinus rhythm  -Stroke team following -SLP: NPO  -PT/OT -Echo LVEF 55-60 percent. - Discussed with Dr. Leonie Man, neurology on 3/21. Patient's large right MCA/ACA territory infarct with cytotoxic edema and multiple left frontal white matter and occipital lobe infarcts. Infarcts embolic secondary to hypercoagulable state from metastatic pancreatic cancer. He indicates very poor prognosis and recommends Comfort Care. Patient currently on IV heparin.  PE with heart strain -Holding eliquis due to NPO. Continue heparin gtt   Metastatic pancreatic cancer -Pancreatic mass noted on CT during recent admission, as well as numerous liver masses concerning for mets. US-guided biopsy of liver lesion performed on 08/09/16, which showed adenocarcinoma with morphology consistent with metastatic pancreatic adenocarcinoma  -Dr. Alvy Bimler input appreciated. Discussed with her 3/21. Recommends Comfort Care. -Palliative care input appreciated. Met extended family in room with palliative care team on 3/21. Family indicates that they are planning to meet with extended family towards the end of this week  to make further decisions. Discussed again regarding overall poor prognosis and would not recommend  artificial means of feeding i.e. PEG tube/NG tube or TPN. - As per discussion with extended family on 3/22, they would like to discontinue morphine at this time and will reconsider it in the future if needed. Provided indications for morphine use.  SIRS -Without evidence of infection. Patient is tachycardic, tachypneic, with leukocytosis, but afebrile. CTA chest without pneumonia, influenza screen negative, UA without infection  -Blood cultures pending -Sputum culture pending  -Started on levaquin empirically but will hold for now without gross evidence of infection. Could be secondary to cancer and above illnesses as outlined above  -IVF   Elevated troponin -Due to PE and demand ischemia, previously admission with peak of troponin at 9.7 -Had pleuritic chest pain prior to admission which has been improving. No further chest pain or pressure per son's report  -Trended down 0.89 --> 0.71   HTN -Holding Norvasc due to NPO   Chronic normocytic anemia -Stable, baseline Hgb around 9-10   Hypokalemia -Replace and trend   Goals of care -Dr. Maylene Roes Discussed with son and with multiple family members. He has metastatic pancreatic cancer that is newly diagnosed. He also suffered from a PE and now a large stroke. He remains minimally verbal, follows some commands, not safe to eat. Discussed with them that his prognosis is poor. Unless he makes improvements in swallowing, nutrition, activity, he will likely not be a candidate for treatment measures for his metastatic pancreatic cancer, which carries a poor prognosis in any case. It is unclear that they understand or grasp the gravity of patient's condition. They tell me that they will continue to pray and hope that he can get a feeding tube soon for nutrition. Feeding tube was also discussed that it likely may not give any quality of life for patient. Palliative care consulted.  As per my discussion with family along with palliative care team 3/21, it  appears that family is getting a grasp of poor prognosis.  DVT prophylaxis: Heparin Code Status: Full.   Family Communication: multiple family members at bedside. Updated care and answered questions. Spent approximately 20 minutes. Disposition Plan: pending further stabilization, very poor prognosis    Consultants:   Palliative care   Oncology to follow as outpatient   Neurology   Procedures:   None  Antimicrobials:   Levaquin stopped    Subjective: Intermittently but briefly opens eyes. Purposeful movements of right upper extremity. Nonverbal. Does not follow instructions. As per family at bedside, has not been in pain and hence would like to stop ordered morphine.   Objective: Vitals:   08/19/16 0119 08/19/16 0542 08/19/16 1029 08/19/16 1357  BP: (!) 158/78 (!) 162/72 (!) 156/80 (!) 155/90  Pulse: 98 88 93 98  Resp: (!) 22 (!) _0 Temp: 97.9 F (36.6 C) 98.3 F (36.8 C) 97.9 F (36.6 C) 98 F (36.7 C)  TempSrc: Oral Axillary Axillary Axillary  SpO2: 100% 100% 97% 100%  Weight:      Height:        Intake/Output Summary (Last 24 hours) at 08/19/16 1508 Last data filed at 08/19/16 1300  Gross per 24 hour  Intake           625.45 ml  Output             1450 ml  Net          -824.55 ml   [employer]  08/15/16 2010 08/16/16 0123 08/17/16 0043  Weight: 72.6 kg (160 lb) 69.7 kg (153 lb 9.6 oz) 70.1 kg (154 lb 8 oz)    Examination:  General exam: Ill appearing  Respiratory system: Poor inspiratory effort. Diminished breath sounds in the bases but otherwise clear to auscultation. No increased work of breathing Cardiovascular system: S1 & S2 heard, regular rhythm. No JVD, murmurs, rubs, gallops or clicks. No pedal edema.Telemetry: Sinus rhythm. Gastrointestinal system: Abdomen is nondistended, soft and nontender. No organomegaly or masses felt. Normal bowel sounds heard. Central nervous system: Mental status as indicated above. Purposeful movement of  right upper extremity.   Extremities: Only moves right upper extremity as indicated above Skin: No rashes, lesions or ulcers on visible skin   Data Reviewed: I have personally reviewed following labs and imaging studies  CBC:  Recent Labs Lab 08/16/16 0224 08/16/16 0845 08/17/16 0222 08/18/16 0619 08/19/16 0408  WBC 17.1* 18.5* 18.7* 16.9* 17.2*  NEUTROABS 12.9*  --   --   --   --   HGB 9.3* 9.8* 9.5* 9.1* 9.6*  HCT 28.8* 30.3* 29.1* 28.8* 30.6*  MCV 83.2 83.2 82.9 83.7 83.4  PLT 191 181 192 201 349   Basic Metabolic Panel:  Recent Labs Lab 08/15/16 2010 08/16/16 0845 08/17/16 0222 08/18/16 0619  NA 133* 133* 133* 134*  K 3.6 4.1 3.4* 4.0  CL 98* 104 101 106  CO2 20* 20* 22 22  GLUCOSE 108* 132* 108* 133*  BUN _0 CREATININE 0.84 0.65 0.72 0.59*  CALCIUM 8.4* 8.3* 8.3* 8.1*  MG  --  1.7  --  1.7  PHOS  --  3.2  --   --    GFR: Estimated Creatinine Clearance: 81.5 mL/min (A) (by C-G formula based on SCr of 0.59 mg/dL (L)). Liver Function Tests:  Recent Labs Lab 08/15/16 2010 08/16/16 0845  AST 59* 55*  ALT 38 38  ALKPHOS 133* 136*  BILITOT 1.2 1.3*  PROT 6.6 7.4  ALBUMIN 1.9* 2.1*    Recent Labs Lab 08/15/16 2010  LIPASE 117*   No results for input(s): AMMONIA in the last 168 hours. Coagulation Profile:  Recent Labs Lab 08/16/16 0845  INR 2.08   Cardiac Enzymes:  Recent Labs Lab 08/16/16 0224 08/16/16 0859  TROPONINI 0.89* 0.71*   BNP (last 3 results) No results for input(s): PROBNP in the last 8760 hours. HbA1C: No results for input(s): HGBA1C in the last 72 hours. CBG:  Recent Labs Lab 08/16/16 0827 08/17/16 0632 08/18/16 1102  GLUCAP 128* 108* 145*   Lipid Profile: No results for input(s): CHOL, HDL, LDLCALC, TRIG, CHOLHDL, LDLDIRECT in the last 72 hours. Thyroid Function Tests: No results for input(s): TSH, T4TOTAL, FREET4, T3FREE, THYROIDAB in the last 72 hours. Anemia Panel: No results for input(s): VITAMINB12,  FOLATE, FERRITIN, TIBC, IRON, RETICCTPCT in the last 72 hours. Sepsis Labs:  Recent Labs Lab 08/16/16 0859  LATICACIDVEN 1.4    Recent Results (from the past 240 hour(s))  Culture, blood (Routine X 2) w Reflex to ID Panel     Status: None (Preliminary result)   Collection Time: 08/16/16 12:05 AM  Result Value Ref Range Status   Specimen Description BLOOD LEFT HAND  Final   Special Requests BOTTLES DRAWN AEROBIC ONLY 5CC  Final   Culture NO GROWTH 3 DAYS  Final   Report Status PENDING  Incomplete  Culture, blood (Routine X 2) w Reflex to ID Panel     Status: None (  Preliminary result)   Collection Time: 08/16/16 12:15 AM  Result Value Ref Range Status   Specimen Description BLOOD RIGHT ARM  Final   Special Requests BOTTLES DRAWN AEROBIC ONLY 5CC  Final   Culture NO GROWTH 3 DAYS  Final   Report Status PENDING  Incomplete       Radiology Studies: No results found.    Scheduled Meds: . sodium chloride flush  3 mL Intravenous Q12H   Continuous Infusions: . sodium chloride 50 mL/hr at 08/19/16 0806  . heparin 1,150 Units/hr (08/19/16 0734)     LOS: 4 days    Time spent: 30 minutes   Jared Mestre, DO [facility] [email or web address] Password [facility] 08/19/2016, 3:08 PM

## 2016-08-19 NOTE — Progress Notes (Signed)
Patient had another daughter arrive 08/18/16. RN reviewed all labs, images and current treatment plan with patient family. Re-enforcement neccessary. Patient family would like to take patient home. Case management assistance requested. Report given to oncoming RN.

## 2016-08-19 NOTE — Evaluation (Signed)
Physical Therapy Evaluation Patient Details Name: Jared Clements MRN: [id number] DOB: November 12, 1945 Today's Date: 08/19/2016   History of Present Illness  71 y.o. male with medical history significant for prostate cancer status post resection, pancreatic and liver masses status post recent biopsy, and recent diagnosis of acute saddle PE with heart strain, discharge from the hospital on 08/10/2016 with Eliquis and with follow-up with surgery regarding the biopsy.  Admitted to dyspnea adn wosening cough. On 3/19, patient was noted to be somnolent and obtunded. Stat CT of the head revealed a large right-sided stroke.Large R MCA/ACA territory infarct with cytotoxic edema and multiple L frontal white matter and occipital lobe infarcts.  Clinical Impression  Patient presents at total care with limited prognosis.  Family presents and reports pt independent two weeks prior.  Currently with L neglect, R gaze preference, very limited response to commands and no active movement of L UE/LE.  Family educated in positioning, skin protection, HOB elevation, and importance of ROM (though not yet formally instructed).  Feel continued skilled PT indicated for family education on care for prevention of side effects of immobility and may need follow up HHPT for education on hoyer lift and positioning at home depending on if Hospice services requested.     Follow Up Recommendations Home health PT;Supervision/Assistance - 24 hour    Equipment Recommendations  Hospital bed;Other (comment) (hoyer lift)    Recommendations for Other Services       Precautions / Restrictions Precautions Precautions: Fall;Other (comment) (at risk for skin breakdown, L shoulder subluzation) Restrictions Weight Bearing Restrictions: No      Mobility  Bed Mobility Overal bed mobility: Needs Assistance Bed Mobility: Rolling;Sidelying to Sit;Sit to Supine Rolling: Total assist Sidelying to sit: Total assist;+2 for physical assistance   Sit  to supine: +2 for physical assistance;Total assist   General bed mobility comments: no initiation of movement  Transfers                 General transfer comment: not attempted at this time  Ambulation/Gait                Stairs            Wheelchair Mobility    Modified Rankin (Stroke Patients Only) Modified Rankin (Stroke Patients Only) Pre-Morbid Rankin Score: No symptoms Modified Rankin: Severe disability     Balance Overall balance assessment: Needs assistance   Sitting balance-Leahy Scale: Zero Sitting balance - Comments: OT provided total assist for sitting about 10 minutes while attempt to engage in reaching, grasping or looking to L.  Multiple instructions with both [place] and [place]; multiple family in room and at times pt with both eyes open, but mostly with L eye closed and R eye half closed Postural control: Left lateral lean;Posterior lean                                   Pertinent Vitals/Pain Pain Assessment: Faces Faces Pain Scale: No hurt    Home Living Family/patient expects to be discharged to:: Unsure Living Arrangements: Children Available Help at Discharge: Family                  Prior Function Level of Independence: Independent               Hand Dominance   Dominant Hand: Left (per chart)    Extremity/Trunk Assessment   Upper Extremity Assessment Upper Extremity  Assessment: Defer to OT evaluation RUE Deficits / Details: moving RUE spontaneously, but not necessarily to command; able to lift to touch head in supine. Did not actively lift in sitting. LUE Deficits / Details: no active movement observed. Appears flaccid. No increase in tone    Lower Extremity Assessment Lower Extremity Assessment: RLE deficits/detail;LLE deficits/detail RLE Deficits / Details: AAROM WFL, moves with assist, but not on his own LLE Deficits / Details: PROM WFL, no active movement noted, no clonus, seems to have  weakened but not flaccid tone    Cervical / Trunk Assessment Cervical / Trunk Assessment: Other exceptions Cervical / Trunk Exceptions: posterior pelvic tilt; L lateral lean. forward head turned to R; no righting rxn observed. Total A to maintian sitting  Communication   Communication: Other (comment) (pt from [place]; speaks English per family)  Cognition Arousal/Alertness: Lethargic Behavior During Therapy: Flat affect Overall Cognitive Status: Impaired/Different from baseline Area of Impairment: Following commands;Attention   Current Attention Level: Focused   Following Commands:  (did not consistnetly follow commands)       General Comments: Pt nonverbal during assessemnt. Possibly followed command to hold up R arm. Family also asking pt to follow commands in his native language - no command follow.     General Comments General comments (skin integrity, edema, etc.): Family educated in [facility] at 34 or greater, repositioning for prevention of skin breakdown, cleansing if soiled (performed with son A during session), and plans for hoyer lift, hospital bed if to home.     Exercises     Assessment/Plan    PT Assessment Patient needs continued PT services  PT Problem List Decreased strength;Decreased balance;Decreased cognition;Decreased mobility;Impaired tone;Decreased activity tolerance       PT Treatment Interventions Patient/family education;Therapeutic activities;Balance training;Functional mobility training;DME instruction    PT Goals (Current goals can be found in the Care Plan section)  Acute Rehab PT Goals Patient Stated Goal: unable to state PT Goal Formulation: With patient/family Time For Goal Achievement: 08/26/16 Potential to Achieve Goals: Fair    Frequency Min 3X/week   Barriers to discharge        Co-evaluation PT/OT/SLP Co-Evaluation/Treatment: Yes Reason for Co-Treatment: For patient/therapist safety;Complexity of the patient's impairments (multi-system  involvement)           End of Session   Activity Tolerance: Patient limited by lethargy Patient left: in bed;with call bell/phone within reach;with family/visitor present   PT Visit Diagnosis: Hemiplegia and hemiparesis Hemiplegia - Right/Left: Left Hemiplegia - dominant/non-dominant: Dominant Hemiplegia - caused by: Cerebral infarction         Time: 1145-1210 PT Time Calculation (min) (ACUTE ONLY): 25 min   Charges:   PT Evaluation $PT Eval High Complexity: 1 Procedure     PT G CodesReginia Naas 2016-09-17, 3:13 PM Magda Kiel, [place] 2016/09/17

## 2016-08-19 NOTE — Progress Notes (Signed)
Daily Progress Note   Patient Name: Jared Clements       Date: 08/19/2016 DOB: 1945/10/06  Age: 71 y.o. MRN#: [id number] Attending Physician: Modena Jansky, MD Primary Care Physician: Harvie Junior, MD Admit Date: 08/15/2016  Reason for Consultation/Follow-up: Establishing goals of care and Psychosocial/spiritual support  Subjective: [name] remains similar to yesterday. He is unresponsive, though his eyes are open this morning (not tracking). More family is at the bedside and relate that he wiggled some fingers on his left hand.   Length of Stay: 4  Current Medications: Scheduled Meds:  . sodium chloride flush  3 mL Intravenous Q12H    Continuous Infusions: . sodium chloride 50 mL/hr at 08/19/16 0806  . heparin 1,150 Units/hr (08/19/16 0734)    PRN Meds: acetaminophen, bisacodyl, ipratropium-albuterol, morphine injection, [DISCONTINUED] ondansetron **OR** ondansetron (ZOFRAN) IV  Physical Exam         Constitutional: Vital signs are normal. He appears listless. He has a sickly appearance. No distress.  Frail man appears older than stated age  HENT:  Mouth/Throat: Mucous membranes are dry.  Poor dentition Cardiovascular: Normal rate and regular rhythm.   Pulmonary/Chest: No accessory muscle usage. Tachypnea noted. He has no wheezes.  Abdominal: Soft. Bowel sounds are normal.  Musculoskeletal:  Spontaneous non-purposeful movement of RUE. Responsive movement of RLE. Could not elicit LUE/LLE movement.   Neurological:   Non-verbal.Unresponsive. Eyes open but not tracking.  Skin: Skin is warm and dry.   Vital Signs: BP (!) 162/72 (BP Location: Right Arm)   Pulse 88   Temp 98.3 F (36.8 C) (Axillary)   Resp (!) 22   Ht 6' 2"  (1.88 m)   Wt 70.1 kg (154 lb 8 oz)   SpO2 100%   BMI 19.84 kg/m  SpO2: SpO2: 100 % O2 Device:  O2 Device: Nasal Cannula O2 Flow Rate: O2 Flow Rate (L/min): 2 L/min  Intake/output summary:   Intake/Output Summary (Last 24 hours) at 08/19/16 6295 Last data filed at 08/19/16 0348  Gross per 24 hour  Intake           336.15 ml  Output             2650 ml  Net         -2313.85 ml   LBM: Last BM Date:  (prior to arrival) Baseline Weight: Weight: 72.6 kg (160 lb) Most recent weight: Weight: 70.1 kg (154 lb 8 oz)  Palliative Assessment/Data: PPS 10%   Flowsheet Rows     Most Recent Value  Intake Tab  Referral Department  Hospitalist  Unit at Time of Referral  Cardiac/Telemetry Unit  Palliative Care Primary Diagnosis  Cancer  Date Notified  08/16/16  Palliative Care Type  New Palliative care  Reason for referral  Clarify Goals of Care  Date of Admission  08/15/16  # of days IP prior to Palliative referral  1  Clinical Assessment  Psychosocial & Spiritual Assessment  Palliative Care Outcomes      Patient Active Problem List   Diagnosis Date Noted  . Pancreatic adenocarcinoma ([place])   . Goals of care, counseling/discussion   . Palliative care by specialist   . 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 Acute left hemiparesis ([place])   . Hemisensory loss   . Hemianopia, homonymous, left   . Pancreatic mass 08/15/2016  . Liver metastases ([place]) 08/15/2016  . Elevated CEA 08/09/2016  . Metastasis ([place]) 08/07/2016  . History of prostate cancer 08/06/2016  . Aortic calcification ([place]) 08/06/2016  . Solitary pulmonary nodule 08/06/2016  . Acute respiratory failure with hypoxia ([place]) 08/06/2016  . Impaired fasting glucose 08/06/2016  . Prediabetes 08/06/2016  . Elevated troponin 08/06/2016  . Demand ischemia ([place]) 08/06/2016  . Malnutrition of moderate degree 08/06/2016  . Anemia of chronic disease 08/06/2016  . Subacute pulmonary embolism with acute cor pulmonale ([place]) 08/04/2016  . Dyspnea   . HLD (hyperlipidemia) 10/24/2014  . Essential  hypertension   . Hyponatremia 09/15/2012  . Hypokalemia 09/15/2012    Palliative Care Assessment & Plan   HPI: 71 y.o. male  with past medical history of prostate cancer s/p resection, pancreatic adenocarcinoma with liver metastases (recently diagnosed by liver biopsy 08/09/16), and acute saddle PE with heart strain (noted in prior admission, started on Eliquis). Home health nurse noted him to be dyspneic and hypoxic at home, and directed him to the ED. He was admitted on 08/15/2016 with acute hypoxemic respiratory failure due to recent PE (of note, had not been a candidate for home O2 at prior discharge). Unfortunately, on 3/19 he was found to be obtunded and somnolent. Brain imaging revealed a large right-sided acute infarct involving the entirety of the right middle and anterior cerebral artery territories with associated cytotoxic edema. Neurology following. Palliative was consulted to assist in goals of care given newly diagnosed metastatic pancreatic adenocarcinoma and acute large stroke  Assessment: I met with [name] family 3/20 and had an extensive conversation with them. Please see my initial consult note for full details. In brief, they felt they needed Oncology to weigh-in on possible treatment options, prior to making a formal decision. Additionally, they needed to discuss the options with family abroad, which included the first born son (who culturally makes decisions for the family if the father is unable).  Dr. Alvy Bimler met with the family 3/20 in the evening and explained that he is not a candidate for any kind of systemic treatment given his poor performance status. Furthermore, she recommended against interruption of anticoagulation for any procedure (such as feeding tube placement), and reiterated that a feeding tube would not change his course.  I met with the family again 3/21. Two sons (including the eldest locally, [name]) and two daughters were present. We reviewed the  information Oncology had given them. They understood the cancer could not be treated. They also knew there was nothing further to be done about the stroke. Unfortunately, there still seems to be a barrier to connecting these two life-limiting issues with end of life. Past of the issues is that they need direction from their eldest brother, who does not live in the [place]. The other issues is that they struggle to understand the concept of end of life when their father is breathing in front of them. Yesterday, one family member explained to me that a person's breath signifies God's will for life.   They are most concerned with ensuring his nutritional needs are met. Different providers had brought up venues for nutrition: IV, NG, PEG. They had numerous questions about these routes and were attempting to elicit my opinion on which would be the best option. I explained the significant risks associated  with each. I also reiterated that nutrition would not change his course, it wound only prolong his life without improving it. While they acknowledged my explanations, I do not believe they fully understood it as we continued to circle back to the same line of questions.   Finally, the family was able to share that they were working to set up a family meeting to discuss the plan for moving forward. There are 11 children, who reside all over the [place] and abroad. I tried to explore what options they were considering, given his cancer cannot be treated and placing a feeding tube would be extremely risky and not recommended. [name] was not able to clarify what they were discussing, but explained that they needed time to consider things and discuss further. At this juncture, I believe the Palliative role would be to continue to support the family with information and clarification, however I do not believe decisions will be made before they can have this family meeting.   Today, I followed-up again to help answer questions and  provide support. [name] was present at the bedside with two sisters who have flown in from other states. I discussed medication with the two sisters, who had significant questions about the rationale for PRN Morphine and Zofran. We talked about monitoring for non-verbal signs of discomfort, and the importance of being an advocate for Mr. Contino if they feel he needs symptom management. They declined an update on his health/clinical status; they feel they understand the situation and have been updated by their brother.   Recommendations/Plan:  Full code, continue supportive care  Family working to set up family meeting for further discussions, expected to occur on Saturday or Sunday  Goals of Care and Additional Recommendations:  Limitations on Scope of Treatment: Full Scope Treatment  Code Status:  Full code  Prognosis:   Unable to determine; pt has terminal metastatic pancreatic cancer and now a debilitating stroke for which he is unresponsive and not eating or drinking. His family may opt to pursue artificial hydration and nutrition, which would prolong his life. Regardless, I do not believe he has more than a few months at best.  Discharge Planning:  To Be Determined  Care plan was discussed with pt's family.  Thank you for allowing the Palliative Medicine Team to assist in the care of this patient.  Total time: 25 minutes    Greater than 50%  of this time was spent counseling and coordinating care related to the above assessment and plan.  Charlynn Court, NP Palliative Medicine Team [phone] pager (7a-5p) Team Phone # [phone]

## 2016-08-19 NOTE — Progress Notes (Signed)
ANTICOAGULATION CONSULT NOTE - Follow Up Consult  Pharmacy Consult for Heparin Indication: pulmonary embolus  No Known Allergies  Patient Measurements: Height: 6\' 2"  (188 cm) Weight: 154 lb 8 oz (70.1 kg) IBW/kg (Calculated) : 82.2 Heparin Dosing Weight:  77.1 kg  Vital Signs: Temp: 98.3 F (36.8 C) (03/22 0542) Temp Source: Axillary (03/22 0542) BP: 162/72 (03/22 0542) Pulse Rate: 88 (03/22 0542)  Labs:  Recent Labs  08/16/16 0845 08/16/16 0859  08/17/16 0222 08/17/16 1202  08/18/16 0619 08/18/16 1546 08/19/16 0408  HGB 9.8*  --   --  9.5*  --   --  9.1*  --  9.6*  HCT 30.3*  --   --  29.1*  --   --  28.8*  --  30.6*  PLT 181  --   --  192  --   --  201  --  239  APTT  --   --   < > 97* 121*  < > 66* 80* 80*  LABPROT 23.7*  --   --   --   --   --   --   --   --   INR 2.08  --   --   --   --   --   --   --   --   HEPARINUNFRC  --   --   < >  --  1.52*  --  0.80*  --  0.59  CREATININE 0.65  --   --  0.72  --   --  0.59*  --   --   TROPONINI  --  0.71*  --   --   --   --   --   --   --   < > = values in this interval not displayed. Estimated Creatinine Clearance: 81.5 mL/min (A) (by C-G formula based on SCr of 0.59 mg/dL (L)).  Assessment: 61 YOM with recent saddle PE on Eliquis PTA. CT of head 3/10 shows acute ischemic stroke involving right ACA and right MCA territories. Patient NPO after failing swallow exam, and oncology recommends NOT interrupting anticoagulation for feeding tube placement. Will continue to dose IV heparin using aPTT for one more day.  aPTT therapeutic: 80, no bleeding or infusion issues reported   Goal of Therapy:  aPTT goal 66-85 Heparin level 0.3-0.5 units/ml   Monitor platelets by anticoagulation protocol: Yes   Plan:  Heparin gtt at 1150 units/hr F/u daily aPTT and heparin level until correlating Monitor for s/sx of bleeding  Georga Bora, PharmD Clinical Pharmacist Pager: [phone] 08/19/2016 8:02 AM

## 2016-08-19 NOTE — Care Management Important Message (Signed)
Important Message  Patient Details  Name: Jared Clements MRN: [id number] Date of Birth: [birth year]   Medicare Important Message Given:  Yes    [name] Montine Circle 08/19/2016, 2:35 PM

## 2016-08-20 DIAGNOSIS — G9340 Encephalopathy, unspecified: Secondary | ICD-10-CM | POA: Diagnosis not present

## 2016-08-20 DIAGNOSIS — J9601 Acute respiratory failure with hypoxia: Secondary | ICD-10-CM | POA: Diagnosis not present

## 2016-08-20 DIAGNOSIS — C259 Malignant neoplasm of pancreas, unspecified: Secondary | ICD-10-CM | POA: Diagnosis not present

## 2016-08-20 DIAGNOSIS — I63231 Cerebral infarction due to unspecified occlusion or stenosis of right carotid arteries: Secondary | ICD-10-CM | POA: Diagnosis not present

## 2016-08-20 DIAGNOSIS — Z515 Encounter for palliative care: Secondary | ICD-10-CM | POA: Diagnosis not present

## 2016-08-20 DIAGNOSIS — I2609 Other pulmonary embolism with acute cor pulmonale: Secondary | ICD-10-CM | POA: Diagnosis not present

## 2016-08-20 DIAGNOSIS — R0600 Dyspnea, unspecified: Secondary | ICD-10-CM | POA: Diagnosis not present

## 2016-08-20 LAB — CBC
HCT: 30.5 % — ABNORMAL LOW (ref 39.0–52.0)
HEMOGLOBIN: 9.7 g/dL — AB (ref 13.0–17.0)
MCH: 26.3 pg (ref 26.0–34.0)
MCHC: 31.8 g/dL (ref 30.0–36.0)
MCV: 82.7 fL (ref 78.0–100.0)
Platelets: 271 10*3/uL (ref 150–400)
RBC: 3.69 MIL/uL — AB (ref 4.22–5.81)
RDW: 15.5 % (ref 11.5–15.5)
WBC: 17 10*3/uL — ABNORMAL HIGH (ref 4.0–10.5)

## 2016-08-20 LAB — HEPARIN LEVEL (UNFRACTIONATED): Heparin Unfractionated: 0.51 IU/mL (ref 0.30–0.70)

## 2016-08-20 LAB — APTT: aPTT: 73 seconds — ABNORMAL HIGH (ref 24–36)

## 2016-08-20 LAB — GLUCOSE, CAPILLARY: Glucose-Capillary: 111 mg/dL — ABNORMAL HIGH (ref 65–99)

## 2016-08-20 MED ORDER — MORPHINE SULFATE (PF) 2 MG/ML IV SOLN
1.0000 mg | Freq: Once | INTRAVENOUS | Status: AC
  Administered 2016-08-20: 1 mg via INTRAVENOUS
  Filled 2016-08-20: qty 1

## 2016-08-20 NOTE — Progress Notes (Signed)
SLP Cancellation Note  Patient Details Name: Jared Clements MRN: [id number] DOB: [birth year]   Cancelled treatment:       Reason Eval/Treat Not Completed: Fatigue/lethargy limiting ability to participate  Patient is not alert enough to participate in PO trials today.  Palliative Care is on board and ST will follow up in 2-3 days for goals of care and possible continued ST involvement.    Shelly Flatten, MA, [facility] SLP [phone] [name] 08/20/2016, 11:25 AM

## 2016-08-20 NOTE — Progress Notes (Addendum)
Daily Progress Note   Patient Name: Jared Clements       Date: 08/20/2016 DOB: [birth year]  Age: 71 y.o. MRN#: [id number] Attending Physician: Modena Jansky, MD Primary Care Physician: Harvie Junior, MD Admit Date: 08/15/2016  Reason for Consultation/Follow-up: Establishing goals of care and Psychosocial/spiritual support  Subjective: Family cont to struggle with issues around feeding and resuscitation. Per son Collier Salina, at the bedside, who is translating for his mother, pt's spouse wants to take him home for EO, with no PEG, DNR. I reiterated risks and benefits of feeding tubes at EOL. I explained that artifical feeding would not make him better in terms of more wakefulness, living longer and could hasten death 2/2 surgical complications, fluid overload. Son Collier Salina erring towards remaining full code and dtr at the bedside wants a feeding tube. Culturally decisions are made by the eldest son.  Later elder sons [name] and Sherley Bounds present at the bedside. They want to take their father home today. Main issue is retaining IVF per sons. They verbalize that they understand he cannot have a feeding tube. They do not want to change code status to DNR. Issues regarding this seem to be faith based and they verbalize they understand that coding their father  will not cure him. Nonetheless they wish to remain full code.  Explained that hospices do not do IVF.   Length of Stay: 5  Current Medications: Scheduled Meds:  . sodium chloride flush  3 mL Intravenous Q12H    Continuous Infusions: . sodium chloride 50 mL/hr at 08/20/16 0434  . heparin 1,150 Units/hr (08/20/16 0434)    PRN Meds: acetaminophen, bisacodyl, ipratropium-albuterol, [DISCONTINUED] ondansetron **OR** ondansetron (ZOFRAN) IV  Physical  Exam  Constitutional:  Cachetic, minimally responsive man. Appears acutely ill  Pulmonary/Chest: Effort normal.  Musculoskeletal:  Moving RUE  Neurological:  Minimally responsive  Skin: Skin is warm and dry.  Psychiatric:  Unable to test  Nursing note and vitals reviewed.           Vital Signs: BP (!) 173/86 (BP Location: Right Arm) Comment: Reported to RN  Pulse 100   Temp 98.2 F (36.8 C) (Axillary)   Resp (!) 22   Ht 6\' 2"  (1.88 m)   Wt 70.1 kg (154 lb 8 oz)   SpO2 100%   BMI 19.84 kg/m  SpO2: SpO2: 100 % O2 Device: O2 Device: Nasal Cannula O2 Flow Rate: O2 Flow Rate (L/min): 2 L/min  Intake/output summary:  Intake/Output Summary (Last 24 hours) at 08/20/16 1011 Last data filed at 08/20/16 0434  Gross per 24 hour  Intake          1308.15 ml  Output                0 ml  Net          1308.15 ml   LBM: Last BM Date:  (prior to arrival) Baseline Weight: Weight: 72.6 kg (160 lb) Most recent weight: Weight: 70.1 kg (154 lb 8 oz)       Palliative Assessment/Data:    Flowsheet Rows     Most Recent Value  Intake Tab  Referral Department  Hospitalist  Unit at Time of Referral  Cardiac/Telemetry Unit  Palliative Care Primary Diagnosis  Cancer  Date Notified  08/16/16  Palliative Care Type  New Palliative care  Reason for referral  Clarify Goals of Care  Date of Admission  08/15/16  # of days IP prior to Palliative referral  1  Clinical Assessment  Psychosocial & Spiritual Assessment  Palliative Care Outcomes      Patient Active Problem List   Diagnosis Date Noted  . Pancreatic adenocarcinoma ([place])   . Goals of care, counseling/discussion   . Palliative care by specialist   . Acute ischemic right internal carotid artery (ICA) stroke ([place])   . Acute left hemiparesis ([place])   . Hemisensory loss   . Hemianopia, homonymous, left   . Pancreatic mass 08/15/2016  . Liver metastases ([place]) 08/15/2016  . Elevated CEA 08/09/2016  . Metastasis ([place]) 08/07/2016  .  History of prostate cancer 08/06/2016  . Aortic calcification ([place]) 08/06/2016  . Solitary pulmonary nodule 08/06/2016  . Acute respiratory failure with hypoxia ([place]) 08/06/2016  . Impaired fasting glucose 08/06/2016  . Prediabetes 08/06/2016  . Elevated troponin 08/06/2016  . Demand ischemia ([place]) 08/06/2016  . Malnutrition of moderate degree 08/06/2016  . Anemia of chronic disease 08/06/2016  . Subacute pulmonary embolism with acute cor pulmonale ([place]) 08/04/2016  . Dyspnea   . HLD (hyperlipidemia) 10/24/2014  . Essential hypertension   . Hyponatremia 09/15/2012  . Hypokalemia 09/15/2012    Palliative Care Assessment & Plan   Patient Profile: 71 y.o.malewith past medical history of prostate cancer s/p resection, pancreatic adenocarcinoma with liver metastases (recently diagnosed by liver biopsy 08/09/16), and acute saddle PE with heart strain (noted in prior admission, started on Eliquis). Home health nurse noted him to be dyspneic and hypoxic at home, and directed him to the ED. He was admitted on 3/18/2018with acute hypoxemic respiratory failure due to recent PE (of note, had not been a candidate for home O2 at prior discharge). Unfortunately, on 3/19 he was found to be obtunded and somnolent. Brain imaging revealed a large right-sided acute infarct involving the entirety of the right middle and anterior cerebral artery territories with associated cytotoxic edema. Neurology following. Palliative was consulted to assist in goals of care given newly diagnosed metastatic pancreatic adenocarcinoma and acute large stroke   Assessment: Pt is minimally responsive. Non-verbal.   Recommendations/Plan:  Family requested that MS04 be stopped. No non-verbal s/s of pain. Cont to monitor  Will call CM to see if pt can be discharged home on IVF. Pt not on abx so I am not clear whether he has a need  Addendum: Spoke to [facility] who  has agreed to meet family at 39 today .  [facility] has told me they will order IVF as well as Lovenox. They are aware he is a Full Code in the setting of pancreatic cancer with mets to liver and new CVA. At this point monitoring of Lovenox, volume status would be addressed by [facility]. They will leave this facility without heparin or IVF. Will touch base with [facility] liaison this afternoon to see if we have a plan in place and that family is aware of the risks and  of continued IVF ( volume overload) and Lovenox ( monitoring renal function)  Goals of Care and Additional Recommendations:  Limitations on Scope of Treatment: Full Scope Treatment  Code Status:    Code Status Orders        Start     Ordered   08/15/16 2345  Full code  Continuous     08/15/16 2346    Code Status History    Date Active Date Inactive Code Status Order ID Comments User Context   08/04/2016  3:47 PM 08/10/2016  8:48 PM Full Code [id number]  Omar Person, NP ED   10/24/2014  4:48 AM 10/28/2014  1:44 PM Full Code [id number]  Phillips Grout, MD Inpatient   09/15/2012  3:17 PM 09/18/2012  2:00 PM Full Code [id number]  Charlynne Cousins, MD ED       Prognosis:   < 4 weeks in the setting of pancreatic cancer with mets to liver; recent saddle PE and now with new CVA. His prognosis could vary with artificial feeding/PEG placement but will not provide functional improvement and could likely, with his risk of another CVA could lead to another adverse event, death  Discharge Planning:  Recommending home with hospice, DNR and no artifical feeding. Pt would be a candidate for in-pt hospice if this is pt and family goal  Care plan was discussed with Dr. Algis Liming  Thank you for allowing the Palliative Medicine Team to assist in the care of this patient.   Time In: 0945 Time Out: 1110 Total Time 85 min Prolonged Time Billed  yes       Greater than 50%  of this time was spent counseling and coordinating care related to the above  assessment and plan.  Dory Horn, NP  Please contact Palliative Medicine Team phone at [phone] for questions and concerns.

## 2016-08-20 NOTE — Progress Notes (Signed)
ANTICOAGULATION CONSULT NOTE - Follow Up Consult  Pharmacy Consult for Heparin Indication: pulmonary embolus  No Known Allergies  Patient Measurements: Height: 6\' 2"  (188 cm) Weight: 154 lb 8 oz (70.1 kg) IBW/kg (Calculated) : 82.2 Heparin Dosing Weight:  77.1 kg  Vital Signs: Temp: 98.2 F (36.8 C) (03/23 0540) Temp Source: Axillary (03/23 0540) BP: 173/86 (03/23 0540) Pulse Rate: 100 (03/23 0540)  Labs:  Recent Labs  08/18/16 0619 08/18/16 1546 08/19/16 0408 08/20/16 0427  HGB 9.1*  --  9.6* 9.7*  HCT 28.8*  --  30.6* 30.5*  PLT 201  --  239 271  APTT 66* 80* 80* 73*  HEPARINUNFRC 0.80*  --  0.59 0.51  CREATININE 0.59*  --   --   --    Estimated Creatinine Clearance: 81.5 mL/min (A) (by C-G formula based on SCr of 0.59 mg/dL (L)).  Assessment: 37 YOM with recent saddle PE on Eliquis PTA. CT of head 3/10 shows acute ischemic stroke involving right ACA and right MCA territories. Patient NPO after failing swallow exam, and oncology recommends NOT interrupting anticoagulation for feeding tube placement. Will transition to dosing using heparin levels tomorrow as the heparin level seems to be correlating better.  Heparin level:0.51 aPTT therapeutic: 73, no bleeding or infusion issues reported   Goal of Therapy:  aPTT goal 66-85 Heparin level 0.3-0.5 units/ml   Monitor platelets by anticoagulation protocol: Yes   Plan:  Heparin gtt at 1150 units/hr F/u daily aPTT and heparin level until correlating Monitor for s/sx of bleeding  Georga Bora, PharmD Clinical Pharmacist Pager: [phone] 08/20/2016 9:44 AM

## 2016-08-20 NOTE — Progress Notes (Addendum)
PROGRESS NOTE    Jared Clements  QIW:[id number] DOB: 06/25/1945 DOA: 08/15/2016 PCP: Harvie Junior, MD     Brief Narrative:  Jared Clements is a 71 y.o. male with medical history significant for prostate cancer status post resection, pancreatic and liver masses status post recent biopsy, and recent diagnosis of acute saddle PE with heart strain, discharge from the hospital on 08/10/2016 with Eliquis and with follow-up with surgery regarding the biopsy. Patient reports that he continued to have a cough and dyspnea at home and describes his chest pain as slowly improving. He has been dyspneic with minimal exertion, but feels that this is fairly stable from time of recent discharge. He denies fevers or chills and denies leg swelling or orthopnea. He reports continued adherence with the anticoagulant. Home health nurse visited him this afternoon, noted him to be hypoxic, and directed him to the emergency department for further evaluation. He was admitted due to acute hypoxemic respiratory failure, due to recent PE.   On 3/19, patient was noted to be somnolent and obtunded. Stat CT of the head revealed a large right-sided stroke. Stroke team was consulted. He was out of the window for tPA. He continues to be somnolent, although does awaken to voice and follows simple commands.  Oncology recommends consideration to transitioning to comfort care only and so does neurology. Palliative care on board and current plans are to discharge patient on 3/24 with home hospice.  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 Has been adherent to Eliquis. Repeat CTA chest with improvement in PE and RV-to-LV ratio. Did not meet home O2 requirement at time of previous discharge. Returned to hospital with chief complaint of dyspnea.  -Continue [place] O2  Acute encephalopathy likely due to large right ACA and MCA stroke, multiple small foci of acute ischemic left frontal and bilateral occipital lobes -Bilateral distribution suggesting cardioembolic source -No previous hx of A Fib, currently sinus rhythm  -Stroke team following -SLP: NPO  -PT/OT -Echo LVEF 55-60 percent. - Discussed with Dr. Leonie Man, neurology on 3/21. Patient's large right MCA/ACA territory infarct with cytotoxic edema and multiple left frontal white matter and occipital lobe infarcts. Infarcts embolic secondary to hypercoagulable state from metastatic pancreatic cancer. He indicates very poor prognosis and recommends Comfort Care. Patient currently on IV heparin.  PE with heart strain -Holding eliquis due to NPO. Continue heparin gtt   Metastatic pancreatic cancer -Pancreatic mass noted on CT during recent admission, as well as numerous liver masses concerning for mets. US-guided biopsy of liver lesion performed on 08/09/16, which showed adenocarcinoma with morphology consistent with metastatic pancreatic adenocarcinoma  -Dr. Alvy Bimler input appreciated. Discussed with her 3/21. Recommends Comfort Care. -Palliative care input appreciated. Met extended family in room with palliative care team on 3/21. Family indicates that they are planning to meet with extended family towards  the end of this week to make further decisions. Discussed again regarding overall poor prognosis and would not  recommend artificial means of feeding i.e. PEG tube/NG tube or TPN. - As per discussion with extended family on 3/22, they would like to discontinue morphine at this time and will reconsider it in the future if needed. Provided indications for morphine use.  SIRS -Without evidence of infection. Patient is tachycardic, tachypneic, with leukocytosis, but afebrile. CTA chest without pneumonia, influenza screen negative, UA without infection  -Blood cultures pending -Sputum culture pending  -Started on levaquin empirically but will hold for now without gross evidence of infection. Could be secondary to cancer and above illnesses as outlined above  -IVF   Elevated troponin -Due to PE and demand ischemia, previously admission with peak of troponin at 9.7 -Had pleuritic chest pain prior to admission which has been improving. No further chest pain or pressure per son's report  -Trended down 0.89 --> 0.71   HTN -Holding Norvasc due to NPO   Chronic normocytic anemia -Stable, baseline Hgb around 9-10   Hypokalemia -Replace and trend   Goals of care -Dr. Maylene Roes Discussed with son and with multiple family members. He has metastatic pancreatic cancer that is newly diagnosed. He also suffered from a PE and now a large stroke. He remains minimally verbal, follows some commands, not safe to eat. Discussed with them that his prognosis is poor. Unless he makes improvements in swallowing, nutrition, activity, he will likely not be a candidate for treatment measures for his metastatic pancreatic cancer, which carries a poor prognosis in any case. It is unclear that they understand or grasp the gravity of patient's condition. They tell me that they will continue to pray and hope that he can get a feeding tube soon for nutrition. Feeding tube was also discussed that it likely may not give any quality of life for patient. Palliative care consulted.  As per my discussion with family along with palliative care team  3/21, it appears that family is getting a grasp of poor prognosis.  I had extensive discussion with patient's 1 son, her daughter and spouse at bedside. Again explained in detail regarding his multiple severe medical problems and overall poor prognosis. Discussed regarding changing CODE STATUS and option of home hospice versus residential hospice. During my meeting, daughter and spouse seemed to be interested in taking patient home but wanted to speak with their elder brother when he arrived and with case management regarding insurance covering home hospice services and other services. Subsequently received call from palliative care team that family were now asking about feeding tubes, TPN and going home with IV fluids and heparin. I discussed in detail with consulting oncologist and agree no IV fluids on discharge (will not be able to monitor volume status and could cause volume overload/pulmonary edema and further decline, PIV could fall off in no PICC line at this time. Also wouldn't discharge on Lovenox due to difficulty monitoring and adjusting to renal functions which may worsen in the absence of no oral intake). Multiple discussions had with palliative care team who were able to coordinate with the hospice service who met with patient and family and the final decision is for patient to return home tomorrow with hospice and hospice service apparently is taking responsibility for IV hydration and anticoagulation. I have also discussed multiple times with case management.  DVT prophylaxis: Heparin Code Status: Full.   Family Communication: multiple family members at bedside. Updated care and answered questions.  Spent approximately 20 minutes. Disposition Plan: DC home with home hospice on 3/24   Consultants:   Palliative care   Oncology to follow as outpatient   Neurology   Procedures:   None  Antimicrobials:   Levaquin stopped    Subjective: Intermittently but briefly opens eyes.  Purposeful movements of right upper extremity. Nonverbal. Does not follow instructions. As per family at bedside, has not been in pain. Family indicated that he recognized family members yesterday.   Objective: Vitals:   08/19/16 2100 08/20/16 0134 08/20/16 0540 08/20/16 1100  BP: (!) 153/77 (!) 157/83 (!) 173/86 (!) 152/83  Pulse: 96 91 100 91  Resp: (!) 22 (!) 22 (!) 22 (!) 22  Temp: 98.5 F (36.9 C) 97.8 F (36.6 C) 98.2 F (36.8 C) 98.7 F (37.1 C)  TempSrc: Axillary Axillary Axillary Axillary  SpO2: 100% 97% 100% 100%  Weight:      Height:        Intake/Output Summary (Last 24 hours) at 08/20/16 1635 Last data filed at 08/20/16 1500  Gross per 24 hour  Intake           1414.5 ml  Output                0 ml  Net           1414.5 ml   Filed Weights   08/15/16 2010 08/16/16 0123 08/17/16 0043  Weight: 72.6 kg (160 lb) 69.7 kg (153 lb 9.6 oz) 70.1 kg (154 lb 8 oz)    Examination:  General exam: Ill appearing  Respiratory system: Poor inspiratory effort. Diminished breath sounds in the bases but otherwise clear to auscultation. No increased work of breathing Cardiovascular system: S1 & S2 heard, regular rhythm. No JVD, murmurs, rubs, gallops or clicks. No pedal edema.Telemetry: Sinus rhythm. Gastrointestinal system: Abdomen is nondistended, soft and nontender. No organomegaly or masses felt. Normal bowel sounds heard. Central nervous system: Mental status as indicated above. Purposeful movement of right upper extremity.   Extremities: Only moves right upper extremity as indicated above Skin: No rashes, lesions or ulcers on visible skin   Data Reviewed: I have personally reviewed following labs and imaging studies  CBC:  Recent Labs Lab 08/16/16 0224 08/16/16 0845 08/17/16 0222 08/18/16 0619 08/19/16 0408 08/20/16 0427  WBC 17.1* 18.5* 18.7* 16.9* 17.2* 17.0*  NEUTROABS 12.9*  --   --   --   --   --   HGB 9.3* 9.8* 9.5* 9.1* 9.6* 9.7*  HCT 28.8* 30.3* 29.1*  28.8* 30.6* 30.5*  MCV 83.2 83.2 82.9 83.7 83.4 82.7  PLT 191 181 192 201 239 542   Basic Metabolic Panel:  Recent Labs Lab 08/15/16 2010 08/16/16 0845 08/17/16 0222 08/18/16 0619  NA 133* 133* 133* 134*  K 3.6 4.1 3.4* 4.0  CL 98* 104 101 106  CO2 20* 20* 22 22  GLUCOSE 108* 132* 108* 133*  BUN 9 6 7 7   CREATININE 0.84 0.65 0.72 0.59*  CALCIUM 8.4* 8.3* 8.3* 8.1*  MG  --  1.7  --  1.7  PHOS  --  3.2  --   --    GFR: Estimated Creatinine Clearance: 81.5 mL/min (A) (by C-G formula based on SCr of 0.59 mg/dL (L)). Liver Function Tests:  Recent Labs Lab 08/15/16 2010 08/16/16 0845  AST 59* 55*  ALT 38 38  ALKPHOS 133* 136*  BILITOT 1.2 1.3*  PROT 6.6 7.4  ALBUMIN 1.9* 2.1*    Recent Labs  Lab 08/15/16 2010  LIPASE 117*   No results for input(s): AMMONIA in the last 168 hours. Coagulation Profile:  Recent Labs Lab 08/16/16 0845  INR 2.08   Cardiac Enzymes:  Recent Labs Lab 08/16/16 0224 08/16/16 0859  TROPONINI 0.89* 0.71*   BNP (last 3 results) No results for input(s): PROBNP in the last 8760 hours. HbA1C: No results for input(s): HGBA1C in the last 72 hours. CBG:  Recent Labs Lab 08/16/16 0827 08/17/16 0632 08/18/16 1102 08/20/16 0610  GLUCAP 128* 108* 145* 111*   Lipid Profile: No results for input(s): CHOL, HDL, LDLCALC, TRIG, CHOLHDL, LDLDIRECT in the last 72 hours. Thyroid Function Tests: No results for input(s): TSH, T4TOTAL, FREET4, T3FREE, THYROIDAB in the last 72 hours. Anemia Panel: No results for input(s): VITAMINB12, FOLATE, FERRITIN, TIBC, IRON, RETICCTPCT in the last 72 hours. Sepsis Labs:  Recent Labs Lab 08/16/16 0859  LATICACIDVEN 1.4    Recent Results (from the past 240 hour(s))  Culture, blood (Routine X 2) w Reflex to ID Panel     Status: None (Preliminary result)   Collection Time: 08/16/16 12:05 AM  Result Value Ref Range Status   Specimen Description BLOOD LEFT HAND  Final   Special Requests BOTTLES DRAWN  AEROBIC ONLY 5CC  Final   Culture NO GROWTH 4 DAYS  Final   Report Status PENDING  Incomplete  Culture, blood (Routine X 2) w Reflex to ID Panel     Status: None (Preliminary result)   Collection Time: 08/16/16 12:15 AM  Result Value Ref Range Status   Specimen Description BLOOD RIGHT ARM  Final   Special Requests BOTTLES DRAWN AEROBIC ONLY 5CC  Final   Culture NO GROWTH 4 DAYS  Final   Report Status PENDING  Incomplete       Radiology Studies: No results found.    Scheduled Meds: . sodium chloride flush  3 mL Intravenous Q12H   Continuous Infusions: . sodium chloride 50 mL/hr at 08/20/16 1500  . heparin 1,150 Units/hr (08/20/16 1500)     LOS: 5 days    Time spent: 57 minutes   Shreyansh Tiffany, MD, FACP, FHM. [facility] Pager [phone]  If 7PM-7AM, please contact night-coverage [email or web address] Password [facility] 08/20/2016, 6:21 PM

## 2016-08-20 NOTE — Care Management (Signed)
Plan is for patient to discharge home with hospice services tomorrow. [facility] met with the family and they are in agreement with patient discharging home with hospice services. Cm following.

## 2016-08-20 NOTE — Care Management Note (Signed)
Case Management Note  Patient Details  Name: Jared Clements MRN: [id number] Date of Birth: 05/07/1946  Subjective/Objective:      Pt admitted with CVA. He is from home with family. He has history of pancreatic/lung cancer .               Action/Plan: Family wanting to take patient home with IVF. CM and palliative care met with the patient and his children. [facility] currently looking at the case. CM following for d/c needs, physician orders.  Expected Discharge Date:  08/18/16               Expected Discharge Plan:     In-House Referral:     Discharge planning Services     Post Acute Care Choice:    Choice offered to:     DME Arranged:    DME Agency:     HH Arranged:    HH Agency:     Status of Service:  In process, will continue to follow  If discussed at Long Length of Stay Meetings, dates discussed:    Additional Comments:  Pollie Friar, RN 08/20/2016, 1:06 PM

## 2016-08-21 DIAGNOSIS — C259 Malignant neoplasm of pancreas, unspecified: Secondary | ICD-10-CM | POA: Diagnosis not present

## 2016-08-21 DIAGNOSIS — G9340 Encephalopathy, unspecified: Secondary | ICD-10-CM | POA: Diagnosis not present

## 2016-08-21 DIAGNOSIS — I2609 Other pulmonary embolism with acute cor pulmonale: Secondary | ICD-10-CM | POA: Diagnosis not present

## 2016-08-21 DIAGNOSIS — R748 Abnormal levels of other serum enzymes: Secondary | ICD-10-CM | POA: Diagnosis not present

## 2016-08-21 DIAGNOSIS — J9601 Acute respiratory failure with hypoxia: Secondary | ICD-10-CM | POA: Diagnosis not present

## 2016-08-21 DIAGNOSIS — I63231 Cerebral infarction due to unspecified occlusion or stenosis of right carotid arteries: Secondary | ICD-10-CM | POA: Diagnosis not present

## 2016-08-21 DIAGNOSIS — G8194 Hemiplegia, unspecified affecting left nondominant side: Secondary | ICD-10-CM

## 2016-08-21 DIAGNOSIS — R0600 Dyspnea, unspecified: Secondary | ICD-10-CM | POA: Diagnosis not present

## 2016-08-21 DIAGNOSIS — C787 Secondary malignant neoplasm of liver and intrahepatic bile duct: Secondary | ICD-10-CM | POA: Diagnosis not present

## 2016-08-21 LAB — CBC
HCT: 31.1 % — ABNORMAL LOW (ref 39.0–52.0)
Hemoglobin: 9.9 g/dL — ABNORMAL LOW (ref 13.0–17.0)
MCH: 26.8 pg (ref 26.0–34.0)
MCHC: 31.8 g/dL (ref 30.0–36.0)
MCV: 84.1 fL (ref 78.0–100.0)
Platelets: 287 K/uL (ref 150–400)
RBC: 3.7 MIL/uL — ABNORMAL LOW (ref 4.22–5.81)
RDW: 15.7 % — ABNORMAL HIGH (ref 11.5–15.5)
WBC: 16.7 K/uL — ABNORMAL HIGH (ref 4.0–10.5)

## 2016-08-21 LAB — CULTURE, BLOOD (ROUTINE X 2)
CULTURE: NO GROWTH
Culture: NO GROWTH

## 2016-08-21 LAB — GLUCOSE, CAPILLARY: Glucose-Capillary: 101 mg/dL — ABNORMAL HIGH (ref 65–99)

## 2016-08-21 LAB — HEPARIN LEVEL (UNFRACTIONATED): Heparin Unfractionated: 0.29 [IU]/mL — ABNORMAL LOW (ref 0.30–0.70)

## 2016-08-21 LAB — APTT: aPTT: 65 s — ABNORMAL HIGH (ref 24–36)

## 2016-08-21 MED ORDER — ENOXAPARIN SODIUM 100 MG/ML ~~LOC~~ SOLN: 100.0000 mg | SUBCUTANEOUS | Status: AC

## 2016-08-21 MED ORDER — ENOXAPARIN SODIUM 100 MG/ML ~~LOC~~ SOLN
100.0000 mg | SUBCUTANEOUS | Status: DC
  Administered 2016-08-21: 100 mg via SUBCUTANEOUS
  Filled 2016-08-21: qty 1

## 2016-08-21 MED ORDER — MORPHINE SULFATE (CONCENTRATE) 10 MG /0.5 ML PO SOLN: 5.0000 mg | Freq: Four times a day (QID) | ORAL | 0 refills | Status: DC | PRN

## 2016-08-21 MED ORDER — MORPHINE SULFATE (CONCENTRATE) 10 MG /0.5 ML PO SOLN: 5.0000 mg | Freq: Four times a day (QID) | ORAL | 0 refills | Status: AC | PRN

## 2016-08-21 NOTE — Progress Notes (Signed)
Pt D/C home with Hospice, no new concerns,  D/C instructions given with teach back done, Pt's son Verbalize understanding. Pt will leave [place] hospital by [facility]

## 2016-08-21 NOTE — Progress Notes (Signed)
Pt's family requested for pain medication, that he seems to be in pain as he was observed to be frequently touching his head, NP Walden Field (on call) paged and notified, ordered iv morphine 1mg  same given at 2142, pt repositioned and made comfortable in bed, v/s stable, will however continue to monitor. Ob[place]og[place]-Asidi, Cassara Nida Efe

## 2016-08-21 NOTE — Progress Notes (Signed)
ANTICOAGULATION CONSULT NOTE - Follow Up Consult  Pharmacy Consult for Heparin ---> Lovenox Indication: pulmonary embolus  No Known Allergies  Patient Measurements: Height: 6\' 2"  (188 cm) Weight: 154 lb 8 oz (70.1 kg) IBW/kg (Calculated) : 82.2 Heparin Dosing Weight:  77.1 kg  Vital Signs:    Labs:  Recent Labs  08/19/16 0408 08/20/16 0427 08/21/16 0647  HGB 9.6* 9.7* 9.9*  HCT 30.6* 30.5* 31.1*  PLT 239 271 287  APTT 80* 73* 65*  HEPARINUNFRC 0.59 0.51 0.29*   Estimated Creatinine Clearance: 81.5 mL/min (A) (by C-G formula based on SCr of 0.59 mg/dL (L)).  Assessment: 6 YOM with recent saddle PE on Eliquis PTA.  Discharging home with hospice services today To transition from heparin to Lovenox Renal function stable  Goal of Therapy:  Monitor platelets by anticoagulation protocol: Yes   Plan:  Lovenox 100 mg sq Q 24 hours (dose is 1.5 mg sq Q 24 hours) Follow up CBC in 3 to 5 days  Thank you Anette Guarneri, PharmD [phone] 08/21/2016 10:50 AM

## 2016-08-21 NOTE — Progress Notes (Signed)
Spoke with [employer] [facility] [employer]. Labs, IVF, and Lovenox will be managed by hospice provider after DC. This plan of care was established and verified with the hospice medical team yesterday per Bambi. Hospice is ready and expecting to assume care at any time today. Patient will have admitting hospice nurse at home soon after arrival.

## 2016-08-21 NOTE — Discharge Summary (Addendum)
Physician Discharge Summary  Jared Clements LKJ:[id number] DOB: 01-03-1946  PCP: Harvie Junior, MD  Admit date: 08/15/2016 Discharge date: 08/21/2016  Recommendations for Outpatient Follow-up:  1. [facility]: Will assume care when patient reaches home today and will continue to manage all aspects of his medical care at home, including lab monitoring and management of Lovenox. 2. Dr. York Ram: As needed  Home Health: None. Patient going home with hospice. Equipment/Devices: None    Discharge Condition: Guarded, critical and at risk for rapid decline and death. His prognosis without IV fluids is probably less than a week and with IV fluids may prolong to a couple of weeks. CODE STATUS: Full  Diet recommendation: NPO. Patient has not been safe for trial of oral intake.  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 Patient reported that he continued to have a cough and dyspnea at home and described his chest pain as slowly improving. He had been dyspneic with minimal exertion, but felt that this  was fairly stable from time of recent discharge. He denied fevers or chills and denies leg swelling or orthopnea. He reported continued adherence with the anticoagulant. Home health nurse visited him on afternoon of admission, noted him to be hypoxic, and directed him to the emergency department for further evaluation. He was admitted due to acute hypoxemic respiratory failure, due to recent PE.   On 3/19, patient was noted to be somnolent and obtunded. Stat CT of the head revealed a large right-sided stroke. Stroke team was consulted. He was out of the window for tPA. He continued to be somnolent, although does awaken to voice and does not follows commands.  Oncology recommended transitioning to comfort care only and so did neurology. Palliative care was consulted.  Assessment & Plan:   Acute hypoxemic respiratory failure -Discharged from hospital 08/10/16 after acute management of bilateral PE with heart strain. Had been adherent to Eliquis. Repeat CTA chest with improvement in PE and RV-to-LV ratio. Did not meet home O2 requirement at time of previous discharge. Returned to hospital with chief complaint of dyspnea.  -Continue [place] O2 for comfort  Acute encephalopathy due to large right ACA and MCA stroke, multiple small foci of acute ischemic left frontal and bilateral occipital lobes -Bilateral distribution suggesting cardioembolic source -No previous hx of A Fib, currently sinus rhythm  -Stroke team consulted -SLP: NPO and has not been safe for trial of oral intake. -Echo LVEF 55-60 percent. - As per neurology input > Patient's large right MCA/ACA territory infarct with cytotoxic edema and multiple left frontal white matter and occipital lobe infarcts. Infarcts embolic secondary to hypercoagulable state from metastatic pancreatic cancer.  Neurology indicated very poor prognosis and recommends Comfort Care. He was on IV heparin while hospitalized and transition to therapeutic dose Lovenox at  discharge.  PE with heart strain - Lovenox was held due to nothing by mouth status. He was treated with IV heparin while hospitalized. After extensive discussion with palliative care team who coordinated with [facility] and also with case management who discussed with hospice, the hospice services were willing to do outpatient IV fluids and therapeutic dose Lovenox including lab monitoring for same (at least twice weekly CBC and BMP). He was transitioned to full dose Lovenox at discharge.  Metastatic pancreatic cancer -Pancreatic mass noted on CT during recent admission, as well as numerous liver masses concerning for mets. US-guided biopsy of liver lesion performed on 08/09/16, which showed adenocarcinoma with morphology consistent with metastatic pancreatic adenocarcinoma  -Oncology was consulted and patient's case was discussed multiple times with the oncologist who strongly recommended comfort oriented care. - Multiple physicians from different specialties met with several of patient's family members on a daily basis and updated family on patient's overall poor prognosis. Family had a difficult time grasping patient's sudden decline. Due to what appeared to be cultural reasons, they wished to continue full CODE STATUS and eventually decided to take him home with community [facility]. He was not discharged on IV fluids due to concern for lack of monitoring and complications such as fluid overload and pulmonary edema. He remained nothing by mouth due to lack of consistent alertness to evaluate swallowing. If he happens to improve and becomes consistently alert, may consider outpatient speech therapy evaluation for diet recommendations.  SIRS -Without evidence of infection. CTA chest without pneumonia, influenza screen negative, UA without infection  -Blood cultures negative -Started on levaquin empirically but held without gross evidence of infection. Could be secondary to cancer and  above illnesses as outlined above   Elevated troponin -Due to PE and demand ischemia, previously admission with peak of troponin at 9.7 -Had pleuritic chest pain prior to admission which had been improving. No further chest pain or pressure per son's report  -Trended down 0.89 --> 0.71   HTN -Holding Norvasc due to NPO   Chronic normocytic anemia -Stable, baseline Hgb around 9-10   Hypokalemia -Replaced  Goals of care -Dr. Maylene Roes Discussed with son and with multiple family members. He has metastatic pancreatic cancer that is newly diagnosed. He also suffered from a PE and now a large stroke. He remained non verbal, did not follow commands, not safe to eat. Discussed with them that his prognosis is poor. Unless he makes improvements in swallowing, nutrition, activity, he will not be a candidate for treatment measures for his metastatic pancreatic cancer, which carries a poor prognosis in any case. Palliative care consulted.  Please see further discussion above. Discussed with son [name] on day of discharge and he was agreeable to do sublingual morphine for pain, dyspnea or anxiety.     Consultants:   Palliative care   Oncology to follow as outpatient   Neurology   Procedures:   None   Discharge Instructions  Discharge Instructions    Bed rest    Complete by:  As directed    Call MD for:  difficulty breathing, headache or visual disturbances    Complete by:  As directed    Call MD for:  persistant nausea and vomiting    Complete by:  As directed    Call MD for:  severe uncontrolled pain    Complete  by:  As directed    Call MD for:  temperature >100.4    Complete by:  As directed    Discharge instructions    Complete by:  As directed    Diet: nothing by mouth except prescribed meds.       Medication List    STOP taking these medications   amLODipine 10 MG tablet Commonly known as:  NORVASC   apixaban 5 MG Tabs tablet Commonly known as:  ELIQUIS    pantoprazole 40 MG tablet Commonly known as:  PROTONIX     TAKE these medications   enoxaparin 100 MG/ML injection Commonly known as:  LOVENOX Inject 1 mL (100 mg total) into the skin daily. Start taking on:  08/22/2016   morphine CONCENTRATE 10 mg / 0.5 ml concentrated solution Place 0.25 mLs (5 mg total) under the tongue every 6 (six) hours as needed for moderate pain, severe pain, anxiety or shortness of breath.      Follow-up Information    Harvie Junior, MD. Schedule an appointment as soon as possible for a visit.   Specialty:  Family Medicine Contact information: [place]  [id number] [phone]        MD with [facility] Follow up.   Why:  [facility] will assume care when patient reaches home today and will continue to manage all aspects of his medical care at home.         No Known Allergies    Procedures/Studies: Ct Head Wo Contrast  Addendum Date: 08/16/2016   ADDENDUM REPORT: 08/16/2016 10:14 ADDENDUM: Critical Value/emergent results were called by telephone at the time of interpretation on 08/16/2016 at 10:14 am to Dr. Dessa Phi , who verbally acknowledged these results. Electronically Signed   By: Lowella Grip III M.D.   On: 08/16/2016 10:14   Result Date: 08/16/2016 CLINICAL DATA:  Altered mental status with inability to arouse EXAM: CT HEAD WITHOUT CONTRAST TECHNIQUE: Contiguous axial images were obtained from the base of the skull through the vertex without intravenous contrast. COMPARISON:  Oct 25, 2008 FINDINGS: Brain: There is underlying mild diffuse atrophy. There is cytotoxic edema throughout the right frontal lobe, throughout all but the posterior most aspect the right temporal lobe, the mid and anterior right occipital lobe, and anterior to mid right parietal lobe. There is decreased attenuation throughout most of the right basal ganglia region. There is sparing of the right thalamus. There is subtle  effacement of portions of the right lateral ventricle. There is no midline shift, however. There is no evidence of hemorrhage. There is no extra-axial fluid collection. Mild periventricular small vessel disease is noted to the left of midline. Vascular: There is hyperdensity in the proximal right middle cerebral artery as well as in the distal most aspect of the right common carotid artery. Calcification is noted in each carotid siphon region. Skull: The bony calvarium appears intact. Sinuses/Orbits: There is mucosal thickening in multiple ethmoid air cells bilaterally. Frontal sinuses are hypoplastic. There is slight mucosal thickening in each maxillary antrum. Other visualized paranasal sinuses are clear. Orbits appear symmetric bilaterally. Other: Mastoid air cells are clear. IMPRESSION: Evidence of acute infarct involving the anterior and middle cerebral arteries on the right with extensive brain parenchymal involvement on the right as summarized above. No hemorrhage. Extensive cytotoxic edema. Effacement of portions of the lateral ventricle on the right noted. No midline shift. No acute infarct to the left of midline. Elsewhere there is mild atrophy with mild  periventricular small vessel disease on the left. Suspect acute thrombus in the distal most aspect of the right internal auditory canal and extending into the right middle cerebral artery. There are areas of aortic vascular calcification. There are areas of paranasal sinus disease. Electronically Signed: By: Lowella Grip III M.D. On: 08/16/2016 09:55   Ct Angio Chest Pe W And/or Wo Contrast  Result Date: 08/15/2016 CLINICAL DATA:  Central non radiating chest pain. Shortness of breath. Recent admission to hospital for same. Productive cough. EXAM: CT ANGIOGRAPHY CHEST WITH CONTRAST TECHNIQUE: Multidetector CT imaging of the chest was performed using the standard protocol during bolus administration of intravenous contrast. Multiplanar CT image  reconstructions and MIPs were obtained to evaluate the vascular anatomy. CONTRAST:  100 mL Isovue 370 COMPARISON:  08/04/2016 FINDINGS: Cardiovascular: Persistent pulmonary emboli are demonstrated in the right upper lung, right lower lung, and left lower lung segmental and subsegmental pulmonary arteries. There is mild improvement since the previous study with partial interval recanalization demonstrated vertically in the left. There is no definite evidence of any progression. The RV to LV ratio remains elevated at 1.4, improved since previous study. This could indicate persistence of right heart strain. Mild cardiac enlargement. Small pericardial effusion. Normal caliber thoracic aorta with scattered calcifications. Mediastinum/Nodes: Scattered lymph nodes are not pathologically enlarged. Esophagus is decompressed. Thyroid gland is unremarkable. Lungs/Pleura: Evaluation is limited due to motion artifact. There is atelectasis in the lung bases. No discrete consolidation. No pleural effusions. No pneumothorax. Airways are grossly clear. Upper Abdomen: Multiple liver lesions are demonstrated, largest measuring about 4 cm diameter in the lateral segment left lobe. These were better seen on previous contrast-enhanced CT abdomen and pelvis from 08/06/2016 and likely represent metastatic disease. The pancreatic lesion and pancreatic ductal dilatation seen on previous CT are visualized but less well defined without contrast. Changes are suspicious for pancreatic carcinoma and liver metastasis. There is a left adrenal gland nodule measuring about 12 mm in diameter. Musculoskeletal: No chest wall abnormality. No acute or significant osseous findings. Review of the MIP images confirms the above findings. IMPRESSION: Persistent bilateral pulmonary emboli, demonstrating improvement since the previous study. This suggests response to interval therapy. No progression of pulmonary emboli. RV to LV ratio remains elevated at 1.4  although improved since previous study. This could indicate persistence of right heart strain. Mass lesion in the body of the pancreas with pancreatic ductal dilatation and multiple hepatic metastases are again demonstrated but less well defined than on the previous contrast-enhanced CT abdomen and pelvis. Lung nodules seen previously are not well defined on today's study due to motion artifact and atelectasis. Electronically Signed   By: Lucienne Capers M.D.   On: 08/15/2016 22:40   Ct Angio Chest Pe W And/or Wo Contrast  Addendum Date: 08/04/2016   ADDENDUM REPORT: 08/04/2016 15:56 ADDENDUM: Additional clinical information, remote history of prostate cancer. Upon re-review of images, multiple vague low-attenuation lesions in the liver suspicious for metastatic lesions. Dedicated CT abdomen pelvis with contrast would better evaluate the liver lesions. Electronically Signed   By: Donavan Foil M.D.   On: 08/04/2016 15:56   Result Date: 08/04/2016 CLINICAL DATA:  Shortness of breath and chest pain EXAM: CT ANGIOGRAPHY CHEST WITH CONTRAST TECHNIQUE: Multidetector CT imaging of the chest was performed using the standard protocol during bolus administration of intravenous contrast. Multiplanar CT image reconstructions and MIPs were obtained to evaluate the vascular anatomy. CONTRAST:  100 mL Isovue 370 intravenous COMPARISON:  Chest x-ray 08/04/2016, CT  chest 04/12/2012 FINDINGS: Cardiovascular: Satisfactory opacification of the pulmonary arteries to the segmental level. Extensive filling defect within right lower lobe lobar, segmental, and subsegmental arteries. Additional filling defects within segmental and subsegmental branches of the right upper and middle lobe arterial branches. Filling defect also present within left upper lobe lobar, segmental and subsegmental branches with additional small filling defects present within segmental and subsegmental left lower lobe arterial branches. RV LV ratio elevated at  3.2. Small pericardial effusion. Mildly ectatic ascending aorta. Bovine arch variant. Atherosclerotic calcifications. Mediastinum/Nodes: Trachea is midline. Thyroid grossly unremarkable. Scattered subcentimeter lymph nodes in the mediastinum, grossly unchanged, for example precarinal lymph node measures 1 cm. Ill-defined low density within the bilateral hilar regions, uncertain if this is all due to thrombus. The esophagus is grossly unremarkable. Lungs/Pleura: Mild hazy density in the perihilar regions. No consolidation, effusion or pneumothorax. Within the anterior right lung base, there is a 6 x 4 mm oval nodule, 5 mm average diameter, series 6, image number 100. Upper Abdomen: No acute abnormality in the upper abdomen, possible mildly nodular contour of the liver. Mild gynecomastia. Musculoskeletal: Degenerative changes of the spine. No acute or suspicious bone lesions. Review of the MIP images confirms the above findings. IMPRESSION: 1. The study is positive for acute bilateral pulmonary emboli, extensive on the right side. Positive for acute PE with CT evidence of right heart strain (RV/LV Ratio = 3.2) consistent with at least submassive (intermediate risk) PE. The presence of right heart strain has been associated with an increased risk of morbidity and mortality. Please activate Code PE by paging [phone]. 2. Ill-defined soft tissue density within the bilateral hilar regions, uncertain if this is all secondary to thrombus, suggest short interval CT follow-up to exclude hilar adenopathy or possible developing soft tissue density in the left greater than right hila. 3. 5 mm pulmonary nodule in the anterior right lung base. No follow-up needed if patient is low-risk. Non-contrast chest CT can be considered in 12 months if patient is high-risk. This recommendation follows the consensus statement: Guidelines for Management of Incidental Pulmonary Nodules Detected on CT Images: From the Fleischner Society  2017; Radiology 2017; 284:228-243. 4. Mild hazy attenuation within the bilateral perihilar regions could relate to mild edema. Critical Value/emergent results were called by telephone at the time of interpretation on 08/04/2016 at 2:55 pm to Dr. Deno Etienne , who verbally acknowledged these results. Electronically Signed: By: Donavan Foil M.D. On: 08/04/2016 14:55   Mr Jodene Nam Neck W Wo Contrast  Result Date: 08/17/2016 CLINICAL DATA:  Hypoxia.  Acute infarct seen on CT. EXAM: MR HEAD WITHOUT CONTRAST MR CIRCLE OF WILLIS WITHOUT CONTRAST MRA OF THE NECK WITHOUT AND WITH CONTRAST TECHNIQUE: Multiplanar, multiecho pulse sequences of the brain, circle of willis and surrounding structures were obtained without intravenous contrast. Angiographic images of the neck were obtained using MRA technique without and with intravenous contrast. CONTRAST:  68m MULTIHANCE GADOBENATE DIMEGLUMINE 529 MG/ML IV SOLN COMPARISON:  Head CT 08/16/2016 FINDINGS: MR HEAD FINDINGS Brain: There is diffusion restriction throughout the entire right anterior and middle cerebral artery territories. Additionally, there are scattered foci of acute ischemia within the left frontal white matter and both occipital lobes. There is cytotoxic edema with resultant hyperintense T2 weighted signal throughout the right ACA and MCA distributions. There is no midline shift. There is mild narrowing of the right lateral ventricle. No hydrocephalus or extra-axial fluid collection. The midline structures are normal. No age advanced or lobar predominant atrophy. No hemorrhage.  Vascular: There is loss of the normal right middle and anterior cerebral artery flow voids. There are scattered foci of hyperintense T2 weighted signal within the subcortical and deep white matter of the left hemisphere, compatible with chronic microvascular ischemia. No evidence of chronic microhemorrhage or amyloid angiopathy. Skull and upper cervical spine: The visualized skull base,  calvarium, upper cervical spine and extracranial soft tissues are normal. Sinuses/Orbits: No fluid levels or advanced mucosal thickening. No mastoid effusion. Normal orbits. MR CIRCLE OF WILLIS FINDINGS Intracranial internal carotid arteries: There is diminished flow related enhancement within the supraclinoid right internal carotid artery. The left ICA is normal. Anterior cerebral arteries: There is complete loss of flow related enhancement of the right anterior cerebral artery A1 segment. There is a diminutive A2 segments supplied by the anterior communicating artery. The left anterior cerebral artery is normal. Middle cerebral arteries: The right middle cerebral artery is occluded at its origin and no flow related enhancement is seen within its distal vascular territory. The left middle cerebral artery is normal. Posterior communicating arteries: Present on the right. Posterior cerebral arteries: Normal. Basilar artery: Normal. Vertebral arteries: Left dominant. Normal. Superior cerebellar arteries: Normal. Anterior inferior cerebellar arteries: Normal. Posterior inferior cerebellar arteries: Left PICA is not visualized. The right PICA is normal. MRA NECK FINDINGS There is a normal variant aortic arch branching pattern with the brachiocephalic and left common carotid arteries sharing a common origin. On the time-of-flight images of the neck, there is loss of flow related enhancement within the right internal carotid artery at the level of the oropharynx. On the contrast-enhanced MRA images, however, there is contrast enhancement of the segments of the right internal carotid artery. The left common and internal carotid arteries are normal. Both vertebral arteries arise from their respective subclavian arteries without origin stenosis. The vertebral system is mildly left dominant. The cervical segments of the vertebral arteries are normal. Limited imaging of the soft tissues of the neck is unremarkable. IMPRESSION:  1. 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 No midline shift or acute hemorrhage. 2. Multiple scattered small (3-5 mm) foci of acute ischemia within the left frontal white matter and both occipital lobes. Bilateral distribution suggests a central cardioembolic source. 3. 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 The right ACA A2 segment is at least partially supplied via the anterior communicating artery. 4. Time of flight images of the neck show loss of normal flow-related enhancement in the mid-to-distal right ICA; however, the post-contrast images show normal enhancement. Taken together, this probably indicates slow flow within this segment of the right ICA. 5. MRA of the neck is otherwise normal. Electronically Signed   By: Ulyses Jarred M.D.   On: 08/17/2016 00:21   Mr Brain Wo Contrast  Result Date: 08/17/2016 CLINICAL DATA:  Hypoxia.  Acute infarct seen on CT. EXAM: MR HEAD WITHOUT CONTRAST MR CIRCLE OF WILLIS WITHOUT CONTRAST MRA OF THE NECK WITHOUT AND WITH CONTRAST TECHNIQUE: Multiplanar, multiecho pulse sequences of the brain, circle of willis and surrounding structures were obtained without intravenous contrast. Angiographic images of the neck were obtained using MRA technique without and with intravenous contrast. CONTRAST:  27m MULTIHANCE GADOBENATE DIMEGLUMINE 529 MG/ML IV SOLN COMPARISON:  Head CT 08/16/2016 FINDINGS: MR HEAD FINDINGS Brain: There is diffusion restriction throughout the entire right anterior and middle cerebral artery territories. Additionally, there are scattered foci of acute ischemia within the left frontal white matter  and both occipital lobes. There is cytotoxic edema with resultant hyperintense T2 weighted signal throughout the right ACA and MCA distributions. There is no midline shift. There is mild narrowing of the right lateral ventricle. No hydrocephalus or  extra-axial fluid collection. The midline structures are normal. No age advanced or lobar predominant atrophy. No hemorrhage. Vascular: There is loss of the normal right middle and anterior cerebral artery flow voids. There are scattered foci of hyperintense T2 weighted signal within the subcortical and deep white matter of the left hemisphere, compatible with chronic microvascular ischemia. No evidence of chronic microhemorrhage or amyloid angiopathy. Skull and upper cervical spine: The visualized skull base, calvarium, upper cervical spine and extracranial soft tissues are normal. Sinuses/Orbits: No fluid levels or advanced mucosal thickening. No mastoid effusion. Normal orbits. MR CIRCLE OF WILLIS FINDINGS Intracranial internal carotid arteries: There is diminished flow related enhancement within the supraclinoid right internal carotid artery. The left ICA is normal. Anterior cerebral arteries: There is complete loss of flow related enhancement of the right anterior cerebral artery A1 segment. There is a diminutive A2 segments supplied by the anterior communicating artery. The left anterior cerebral artery is normal. Middle cerebral arteries: The right middle cerebral artery is occluded at its origin and no flow related enhancement is seen within its distal vascular territory. The left middle cerebral artery is normal. Posterior communicating arteries: Present on the right. Posterior cerebral arteries: Normal. Basilar artery: Normal. Vertebral arteries: Left dominant. Normal. Superior cerebellar arteries: Normal. Anterior inferior cerebellar arteries: Normal. Posterior inferior cerebellar arteries: Left PICA is not visualized. The right PICA is normal. MRA NECK FINDINGS There is a normal variant aortic arch branching pattern with the brachiocephalic and left common carotid arteries sharing a common origin. On the time-of-flight images of the neck, there is loss of flow related enhancement within the right  internal carotid artery at the level of the oropharynx. On the contrast-enhanced MRA images, however, there is contrast enhancement of the segments of the right internal carotid artery. The left common and internal carotid arteries are normal. Both vertebral arteries arise from their respective subclavian arteries without origin stenosis. The vertebral system is mildly left dominant. The cervical segments of the vertebral arteries are normal. Limited imaging of the soft tissues of the neck is unremarkable. IMPRESSION: 1. 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 No midline shift or acute hemorrhage. 2. Multiple scattered small (3-5 mm) foci of acute ischemia within the left frontal white matter and both occipital lobes. Bilateral distribution suggests a central cardioembolic source. 3. 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 The right ACA A2 segment is at least partially supplied via the anterior communicating artery. 4. Time of flight images of the neck show loss of normal flow-related enhancement in the mid-to-distal right ICA; however, the post-contrast images show normal enhancement. Taken together, this probably indicates slow flow within this segment of the right ICA. 5. MRA of the neck is otherwise normal. Electronically Signed   By: Ulyses Jarred M.D.   On: 08/17/2016 00:21   Ct Abdomen Pelvis W Contrast  Result Date: 08/07/2016 CLINICAL DATA:  Prostate cancer EXAM: CT ABDOMEN AND PELVIS WITH CONTRAST TECHNIQUE: Multidetector CT imaging of the abdomen and pelvis was performed using the standard protocol following bolus administration of intravenous contrast. CONTRAST:  15m ISOVUE-300 IOPAMIDOL (ISOVUE-300) INJECTION 61% COMPARISON:  10/26/2014 CT FINDINGS: Lower chest: Stable cardiomegaly. Small posterior pericardial effusion measuring  up to 12 mm in thickness. Trace bilateral pleural  effusions minimal bibasilar atelectasis. Tiny 4 mm lingular nodular density, series 3, image 2. Hepatobiliary: Numerous hypodense peripherally enhancing hepatic masses consistent with metastatic disease throughout the right and left lobes and caudate. These range in size from 0.5 cm through 4.6 cm This is a new finding since prior CT. No biliary dilatation. Gallbladder is contracted without stones. Pancreas: 2.3 x 1.6 cm subtle masslike abnormality of the pancreatic body (series 3, image 32) with pancreatic ductal dilatation measuring up to 7 mm leading up to this lesion. The pancreatic head and uncinate process are grossly unremarkable. Spleen: Normal Adrenals/Urinary Tract: Stable bilateral adrenal nodules on the right measuring 2.5 x 1.3 cm and on the left, 2.1 x 1.3 cm. These were present on prior study and unchanged in appearance and may reflect adenomas. These precede the pancreatic and hepatic findings. Stomach/Bowel: The stomach is contracted. No small or large bowel dilatation or inflammation is identified. Appendix may be surgically absent. There is a surgical clip at the cecum. Vascular/Lymphatic: Enlarged porta hepatis lymph node measuring 2 x 0.9 cm. Small lymph node measuring 1.2 cm adjacent to the tail of the pancreas. No thrombosis noted of the splenic, superior mesenteric nor portal vein. Reproductive: Prostate is unremarkable. Other: No abdominal wall hernia or abnormality. No abdominopelvic ascites. Musculoskeletal: Chronic remote compressions of L4 and L5 unchanged in appearance. No lytic or blastic disease. IMPRESSION: 1. Suspicious 2.3 x 1.6 cm masslike abnormality in the pancreatic body causing pancreatic ductal dilatation up to 7 mm in caliber leading up to this lesion. Adjacent porta hepatis and peripancreatic lymph nodes consistent with metastatic lymphadenopathy. 2. Numerous large hypodense enhancing metastatic lesions of the liver. These are likely amenable for percutaneous sampling if  indicated. 3. Chronic stable bilateral adrenal nodules since 2016. Given their stability, these likely reflect benign findings such as adenomas as opposed to metastasis. 4. Small posterior pericardial effusion measuring up to 12 mm in thickness. 5. Tiny 4 mm lingular nodule. 6. Chronic mild compressions of L4 and L5.  Osteoblastic disease. Electronically Signed   By: Ashley Royalty M.D.   On: 08/07/2016 00:47   US Biopsy  Result Date: 08/10/2016 CLINICAL DATA:  Multiple hepatic masses and pancreatic mass. The patient now presents for liver biopsy to try to establish a tissue diagnosis. EXAM: ULTRASOUND GUIDED CORE BIOPSY OF LIVER MEDICATIONS: 1.0 mg IV Versed; 50 mcg IV Fentanyl Total Moderate Sedation Time: 11 minutes. The patient's level of consciousness and physiologic status were continuously monitored during the procedure by Radiology nursing. PROCEDURE: The procedure, risks, benefits, and alternatives were explained to the patient. Questions regarding the procedure were encouraged and answered. The patient understands and consents to the procedure. A time out was performed prior to initiating the procedure. The abdominal wall was prepped with chlorhexidine in a sterile fashion, and a sterile drape was applied covering the operative field. A sterile gown and sterile gloves were used for the procedure. Local anesthesia was provided with 1% Lidocaine. Ultrasound was used to localize liver lesions. Left lobe liver mass was chosen for sampling. Under ultrasound guidance, a 17 gauge needle was advanced into the left lobe of the liver. A total of 3 coaxial 18 gauge core biopsy samples were obtained and submitted in formalin. After needle removal post biopsy ultrasound was performed. COMPLICATIONS: None. FINDINGS: Multiple rounded hypoechoic masses are seen throughout the liver parenchyma. These are most visible in the left lobe of the liver by ultrasound. Tissue  sampling demonstrated solid core biopsy samples. No  evidence of immediate bleeding complication. IMPRESSION: Ultrasound-guided core biopsy performed of a lesion within the left lobe of the liver. Electronically Signed   By: Aletta Edouard M.D.   On: 08/10/2016 11:12   Dg Chest Port 1 View  Result Date: 08/15/2016 CLINICAL DATA:  Central non radiating chest pain. Shortness of breath. Patient was diagnosed with pulmonary embolus on 08/04/2016. EXAM: PORTABLE CHEST 1 VIEW COMPARISON:  CT of the chest 08/04/2016 FINDINGS: The cardiac silhouette is mildly enlarged. There is no evidence of focal airspace consolidation, pleural effusion or pneumothorax. Low lung volumes. Osseous structures are without acute abnormality. Soft tissues are grossly normal. IMPRESSION: No radiographic evidence of acute cardiopulmonary disease. Electronically Signed   By: Fidela Salisbury M.D.   On: 08/15/2016 20:30   Dg Chest Port 1 View  Result Date: 08/04/2016 CLINICAL DATA:  Chest pain, shortness of Breath EXAM: PORTABLE CHEST 1 VIEW COMPARISON:  10/25/2014 FINDINGS: Cardiomediastinal silhouette is stable. No infiltrate or pulmonary edema. Stable atelectasis or scarring in lingula. Mild elevation of the right hemidiaphragm again noted. IMPRESSION: No active disease. Stable atelectasis or scarring in lingula. Again noted mild elevation of the right hemidiaphragm. Electronically Signed   By: Lahoma Crocker M.D.   On: 08/04/2016 13:18   Mr Jodene Nam Headm  Result Date: 08/17/2016 CLINICAL DATA:  Hypoxia.  Acute infarct seen on CT. EXAM: MR HEAD WITHOUT CONTRAST MR CIRCLE OF WILLIS WITHOUT CONTRAST MRA OF THE NECK WITHOUT AND WITH CONTRAST TECHNIQUE: Multiplanar, multiecho pulse sequences of the brain, circle of willis and surrounding structures were obtained without intravenous contrast. Angiographic images of the neck were obtained using MRA technique without and with intravenous contrast. CONTRAST:  72m MULTIHANCE GADOBENATE DIMEGLUMINE 529 MG/ML IV SOLN COMPARISON:  Head CT 08/16/2016  FINDINGS: MR HEAD FINDINGS Brain: There is diffusion restriction throughout the entire right anterior and middle cerebral artery territories. Additionally, there are scattered foci of acute ischemia within the left frontal white matter and both occipital lobes. There is cytotoxic edema with resultant hyperintense T2 weighted signal throughout the right ACA and MCA distributions. There is no midline shift. There is mild narrowing of the right lateral ventricle. No hydrocephalus or extra-axial fluid collection. The midline structures are normal. No age advanced or lobar predominant atrophy. No hemorrhage. Vascular: There is loss of the normal right middle and anterior cerebral artery flow voids. There are scattered foci of hyperintense T2 weighted signal within the subcortical and deep white matter of the left hemisphere, compatible with chronic microvascular ischemia. No evidence of chronic microhemorrhage or amyloid angiopathy. Skull and upper cervical spine: The visualized skull base, calvarium, upper cervical spine and extracranial soft tissues are normal. Sinuses/Orbits: No fluid levels or advanced mucosal thickening. No mastoid effusion. Normal orbits. MR CIRCLE OF WILLIS FINDINGS Intracranial internal carotid arteries: There is diminished flow related enhancement within the supraclinoid right internal carotid artery. The left ICA is normal. Anterior cerebral arteries: There is complete loss of flow related enhancement of the right anterior cerebral artery A1 segment. There is a diminutive A2 segments supplied by the anterior communicating artery. The left anterior cerebral artery is normal. Middle cerebral arteries: The right middle cerebral artery is occluded at its origin and no flow related enhancement is seen within its distal vascular territory. The left middle cerebral artery is normal. Posterior communicating arteries: Present on the right. Posterior cerebral arteries: Normal. Basilar artery: Normal.  Vertebral arteries: Left dominant. Normal. Superior cerebellar arteries: Normal. Anterior  inferior cerebellar arteries: Normal. Posterior inferior cerebellar arteries: Left PICA is not visualized. The right PICA is normal. MRA NECK FINDINGS There is a normal variant aortic arch branching pattern with the brachiocephalic and left common carotid arteries sharing a common origin. On the time-of-flight images of the neck, there is loss of flow related enhancement within the right internal carotid artery at the level of the oropharynx. On the contrast-enhanced MRA images, however, there is contrast enhancement of the segments of the right internal carotid artery. The left common and internal carotid arteries are normal. Both vertebral arteries arise from their respective subclavian arteries without origin stenosis. The vertebral system is mildly left dominant. The cervical segments of the vertebral arteries are normal. Limited imaging of the soft tissues of the neck is unremarkable. IMPRESSION: 1. 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 No midline shift or acute hemorrhage. 2. Multiple scattered small (3-5 mm) foci of acute ischemia within the left frontal white matter and both occipital lobes. Bilateral distribution suggests a central cardioembolic source. 3. 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 The right ACA A2 segment is at least partially supplied via the anterior communicating artery. 4. Time of flight images of the neck show loss of normal flow-related enhancement in the mid-to-distal right ICA; however, the post-contrast images show normal enhancement. Taken together, this probably indicates slow flow within this segment of the right ICA. 5. MRA of the neck is otherwise normal. Electronically Signed   By: Ulyses Jarred M.D.   On: 08/17/2016 00:21      Subjective: Occasional he and briefly  opened her eyes but nonverbal and does not follow instructions. Even his purposeful movements of right upper extremities have decreased. Overnight events noted-apparently in some pain and family requested morphine. Patient seems to progressively declining.  Discharge Exam:  Vitals:   08/20/16 0540 08/20/16 1100 08/20/16 1900 08/20/16 2128  BP: (!) 173/86 (!) 152/83 (!) 174/86 (!) 149/86  Pulse: 100 91 (!) 108 (!) 105  Resp: (!) 22 (!) 22 (!) 22 (!) 22  Temp: 98.2 F (36.8 C) 98.7 F (37.1 C)  98.2 F (36.8 C)  TempSrc: Axillary Axillary Axillary Axillary  SpO2: 100% 100% 100% 99%  Weight:      Height:        General exam: Ill appearing  Respiratory system: Poor inspiratory effort. Diminished breath sounds in the bases but otherwise clear to auscultation. No increased work of breathing Cardiovascular system: S1 & S2 heard, regular rhythm. No JVD, murmurs, rubs, gallops or clicks. No pedal edema.Telemetry: Sinus rhythm/Mild sinus tachycardia.. Gastrointestinal system: Abdomen is nondistended, soft and nontender. No organomegaly or masses felt. Normal bowel sounds heard. Central nervous system: Mental status as indicated above.  Extremities: Only moves right upper extremity as indicated above Skin: No rashes, lesions or ulcers on visible skin     The results of significant diagnostics from this hospitalization (including imaging, microbiology, ancillary and laboratory) are listed below for reference.     Microbiology: Recent Results (from the past 240 hour(s))  Culture, blood (Routine X 2) w Reflex to ID Panel     Status: None   Collection Time: 08/16/16 12:05 AM  Result Value Ref Range Status   Specimen Description BLOOD LEFT HAND  Final   Special Requests BOTTLES DRAWN AEROBIC ONLY 5CC  Final   Culture NO GROWTH 5 DAYS  Final   Report Status 08/21/2016 FINAL  Final  Culture, blood (Routine X 2) w Reflex to ID Panel     Status: None   Collection Time: 08/16/16 12:15 AM   Result Value Ref Range Status   Specimen Description BLOOD RIGHT ARM  Final   Special Requests BOTTLES DRAWN AEROBIC ONLY 5CC  Final   Culture NO GROWTH 5 DAYS  Final   Report Status 08/21/2016 FINAL  Final     Labs: CBC:  Recent Labs Lab 08/16/16 0224  08/17/16 0222 08/18/16 0272 08/19/16 0408 08/20/16 0427 08/21/16 0647  WBC 17.1*  < > 18.7* 16.9* 17.2* 17.0* 16.7*  NEUTROABS 12.9*  --   --   --   --   --   --   HGB 9.3*  < > 9.5* 9.1* 9.6* 9.7* 9.9*  HCT 28.8*  < > 29.1* 28.8* 30.6* 30.5* 31.1*  MCV 83.2  < > 82.9 83.7 83.4 82.7 84.1  PLT 191  < > 192 201 239 271 287  < > = values in this interval not displayed. Basic Metabolic Panel:  Recent Labs Lab 08/15/16 2010 08/16/16 0845 08/17/16 0222 08/18/16 0619  NA 133* 133* 133* 134*  K 3.6 4.1 3.4* 4.0  CL 98* 104 101 106  CO2 20* 20* 22 22  GLUCOSE 108* 132* 108* 133*  BUN _0 CREATININE 0.84 0.65 0.72 0.59*  CALCIUM 8.4* 8.3* 8.3* 8.1*  MG  --  1.7  --  1.7  PHOS  --  3.2  --   --    Liver Function Tests:  Recent Labs Lab 08/15/16 2010 08/16/16 0845  AST 59* 55*  ALT 38 38  ALKPHOS 133* 136*  BILITOT 1.2 1.3*  PROT 6.6 7.4  ALBUMIN 1.9* 2.1*   BNP (last 3 results)  Recent Labs  08/04/16 1228  BNP 606.0*   Cardiac Enzymes:  Recent Labs Lab 08/16/16 0224 08/16/16 0859  TROPONINI 0.89* 0.71*   CBG:  Recent Labs Lab 08/16/16 0827 08/17/16 0632 08/18/16 1102 08/20/16 0610 08/21/16 0625  GLUCAP 128* 108* 145* 111* 101*   Urinalysis    Component Value Date/Time   COLORURINE YELLOW 08/16/2016 1127   APPEARANCEUR CLEAR 08/16/2016 1127   LABSPEC 1.014 08/16/2016 1127   PHURINE 6.0 08/16/2016 1127   GLUCOSEU NEGATIVE 08/16/2016 1127   HGBUR MODERATE (A) 08/16/2016 1127   BILIRUBINUR NEGATIVE 08/16/2016 1127   KETONESUR 5 (A) 08/16/2016 1127   PROTEINUR 30 (A) 08/16/2016 1127   UROBILINOGEN 1.0 10/24/2014 0003   NITRITE NEGATIVE 08/16/2016 1127   LEUKOCYTESUR NEGATIVE  08/16/2016 1127    Discussed in detail with patient's son [name] at bedside along with patient's RN. Had multiple discussions with palliative care team, consulting oncologist and case management.  Time coordinating discharge: Over 30 minutes  SIGNED:  Vernell Leep, MD, FACP, [place]. [facility] Pager [phone]  If 7PM-7AM, please contact night-coverage [email or web address] Password [email or web address] 08/21/2016, 1:25 PM

## 2016-08-21 NOTE — Progress Notes (Signed)
Packet for [facility] on front of chart with number to call. Bedside nurse instructed to call PT[facility] when ready to DC.

## 2016-08-22 NOTE — Progress Notes (Signed)
CM confirms for [facility] rep, Bambi, pt discharged yesterday with [facility].  No other CM needs were communicated.

## 2016-08-25 ENCOUNTER — Ambulatory Visit: Payer: Medicare Other | Admitting: Hematology

## 2016-08-26 NOTE — Progress Notes (Signed)
SLP addendum    08/16/16 1350  SLP G-Codes **NOT FOR INPATIENT CLASS**  Functional Assessment Tool Used skilled clinical judgement  Functional Limitations Swallowing  Swallow Current Status (K5537) CM  Swallow Discharge Status (S8270) CL  SLP Evaluations  $ SLP Speech Visit 1 Procedure  SLP Evaluations  $BSS Swallow 1 Procedure    Orbie Pyo [place] M.Ed [employer] [phone]

## 2016-08-26 NOTE — Progress Notes (Signed)
SLP addendum    08/17/16 0930  SLP G-Codes **NOT FOR INPATIENT CLASS**  Functional Assessment Tool Used skilled clinical judgement  Functional Limitations Attention  Swallow Current Status (J4473) CL  Swallow Goal Status (F5844) CK  Swallow Discharge Status (B7127) CK  SLP Evaluations  $ SLP Speech Visit 1 Procedure  SLP Evaluations  $Speech Treatment for Individual 1 Procedure  $ SLP EVAL LANGUAGE/SOUND PRODUCTION 1 Procedure    Orbie Pyo Aerilyn Slee M.Ed [employer] [phone]

## 2016-08-29 NOTE — Progress Notes (Signed)
PT G-Code Late Entry    08/19/16 1550  PT G-Codes **NOT FOR INPATIENT CLASS**  Functional Assessment Tool Used AM-PAC 6 Clicks Basic Mobility  Functional Limitation Mobility: Walking and moving around  Mobility: Walking and Moving Around Current Status (K9223) CN  Mobility: Walking and Moving Around Goal Status [phone]) CN  Mobility: Walking and Moving Around Discharge Status [phone]) [place], [place] 09-13-2016

## 2016-08-29 DEATH — deceased

## 2016-09-02 NOTE — Progress Notes (Signed)
   08/19/16 1405  OT G-codes **NOT FOR INPATIENT CLASS**  Functional Assessment Tool Used Clinical judgement (and chart review)  Functional Limitation Other OT primary  Other OT Primary Current Status (A3557) CN  Other OT Primary Goal Status (D2202) CL   Late entry for [facility], OTR/L [place], OTR/L [phone] 09/02/2016

## 2016-09-06 ENCOUNTER — Other Ambulatory Visit: Payer: Self-pay

## 2016-09-06 NOTE — Patient Outreach (Signed)
[facility]) Care Management  09/06/2016  Daiquan Loomis 10-11-1945 [id number]   EMMI stroke EMMI stroke red alert: Feeling worse overall: YES REFERRAL DATE:  09/06/16  Telephone call to patient regarding EMMI stroke red alert. Contact answering phone states patient deceased.  PLAN; RNCM will notify care management assistant to close patient.  Quinn Plowman RN,BSN,CCM [facility] Telephonic  [phone]

## 2018-08-30 IMAGING — CT CT ANGIO CHEST
2 of 6 series · 17 of 36 positions shown · IV contrast (isovue)
Comparison: Chest x-ray 08/04/2016, CT chest 04/12/2012

ADDENDUM:
Additional clinical information, remote history of prostate cancer.

Upon re-review of images, multiple vague low-attenuation lesions in
the liver suspicious for metastatic lesions. Dedicated CT abdomen
pelvis with contrast would better evaluate the liver lesions.
CLINICAL DATA: Shortness of breath and chest pain
EXAM:
CT ANGIOGRAPHY CHEST WITH CONTRAST
TECHNIQUE: Multidetector CT imaging of the chest was performed using the
standard protocol during bolus administration of intravenous
contrast. Multiplanar CT image reconstructions and MIPs were
obtained to evaluate the vascular anatomy.
CONTRAST:  100 mL Isovue 370 intravenous

[Series 7: pe thins · axial · 0.72mm/px · z∈[+1269,+1515]mm · 16 of 278 slices shown]
[im 16/278  lung]
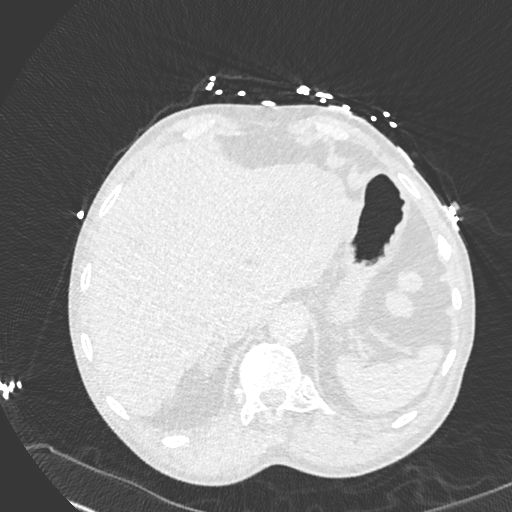
[im 31/278  mediastinal]
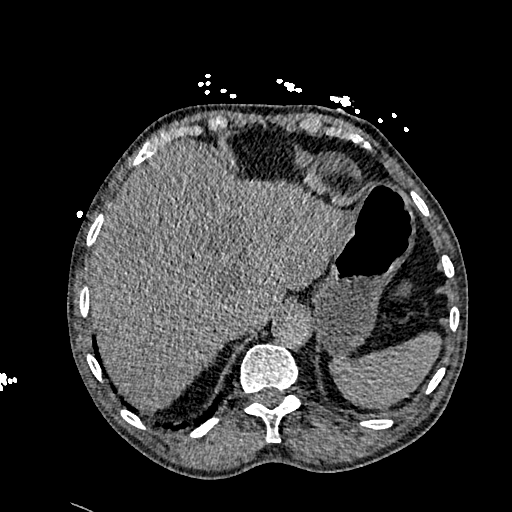
[im 47/278  lung]
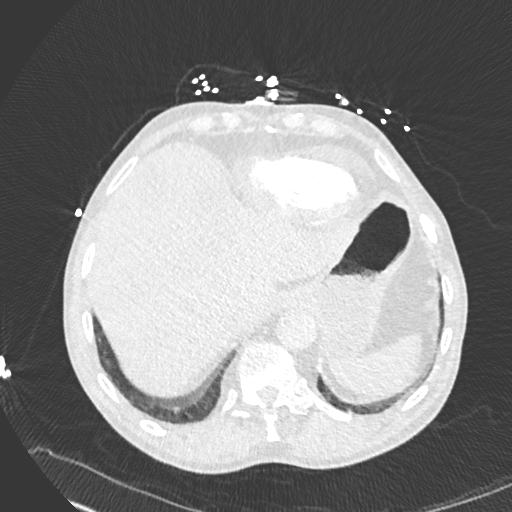
[im 62/278  mediastinal]
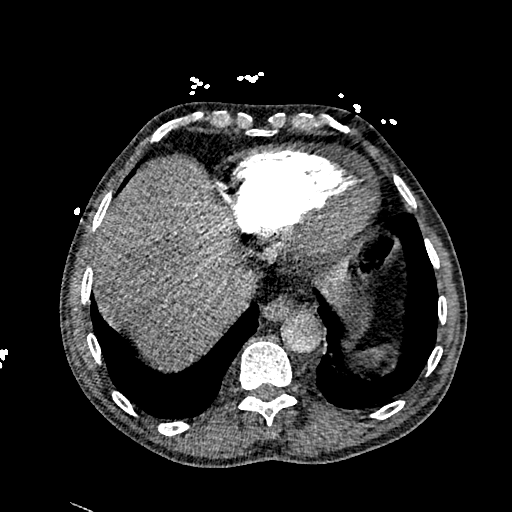
[im 77/278  lung]
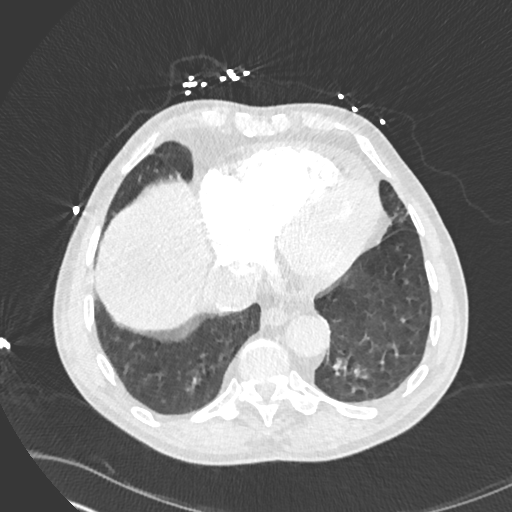
[im 93/278  mediastinal]
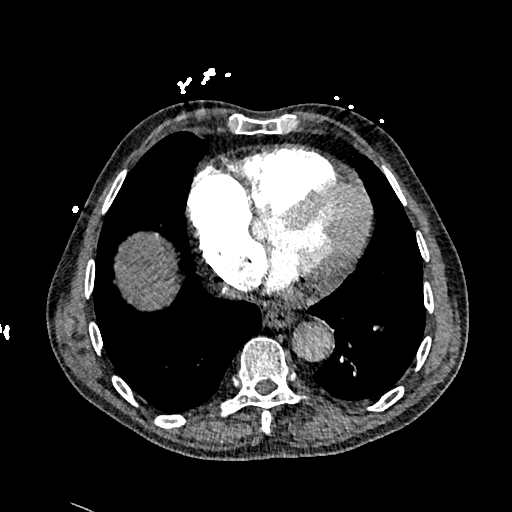
[im 108/278  lung]
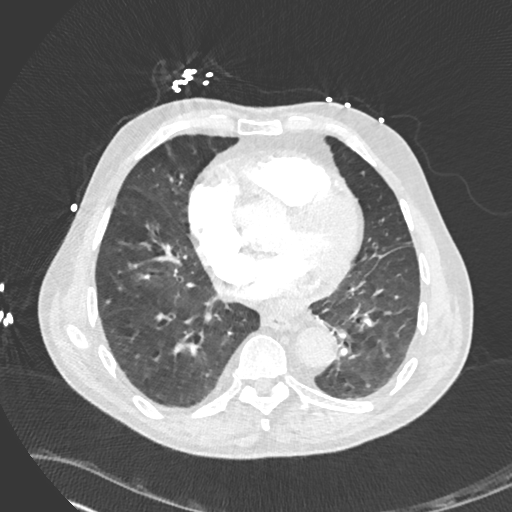
[im 124/278  mediastinal]
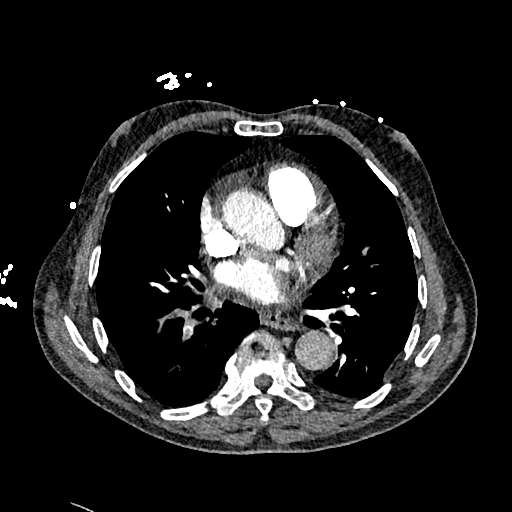
[im 154/278  lung]
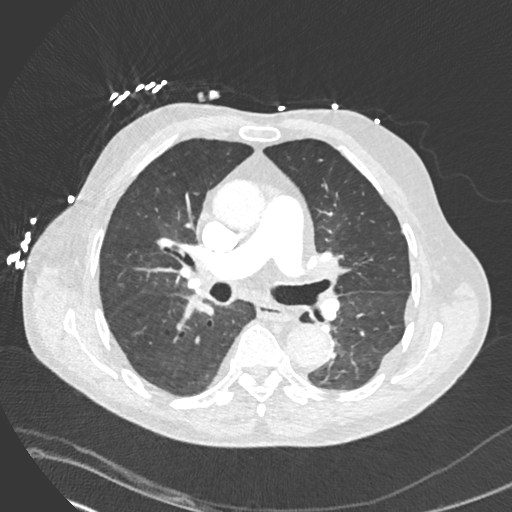
[im 170/278  mediastinal]
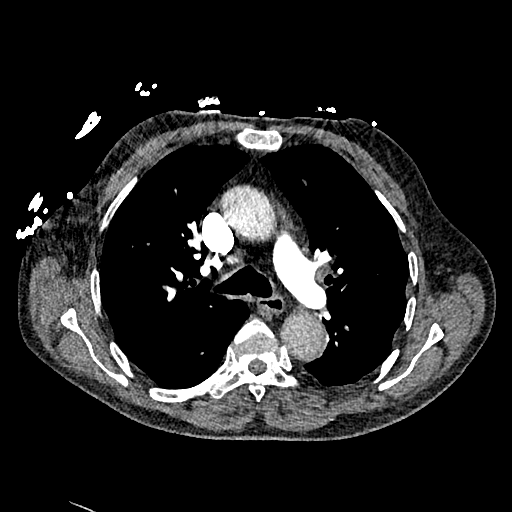
[im 185/278  lung]
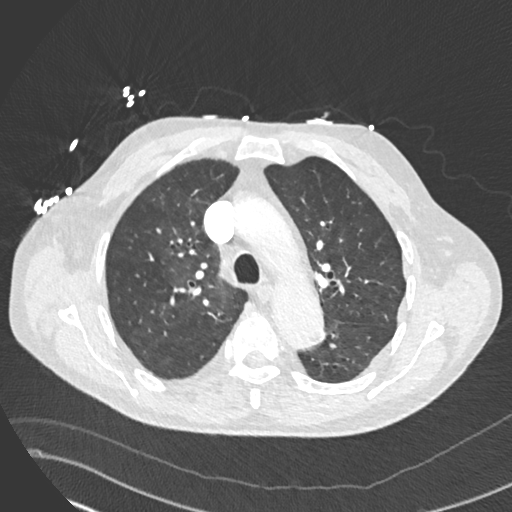
[im 201/278  mediastinal]
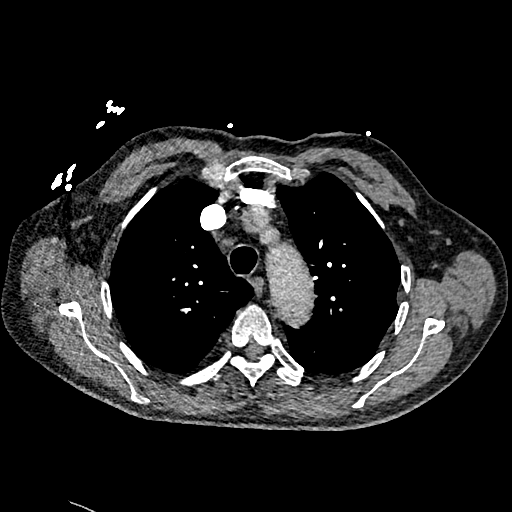
[im 216/278  lung]
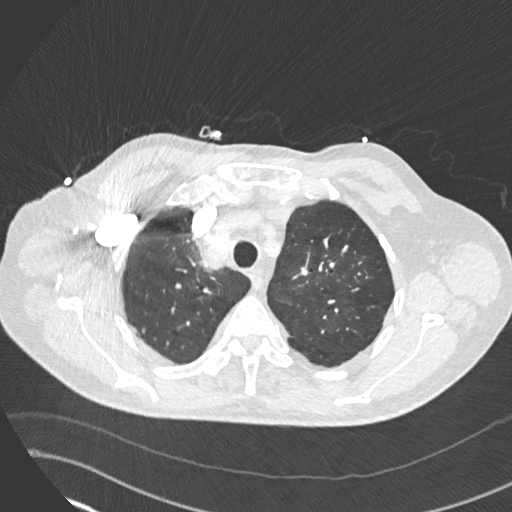
[im 231/278  mediastinal]
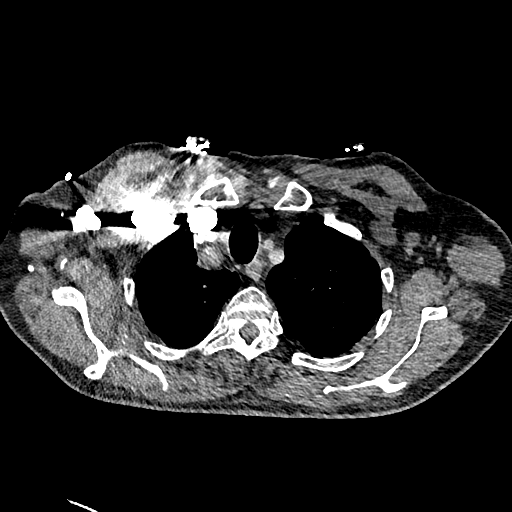
[im 247/278  lung]
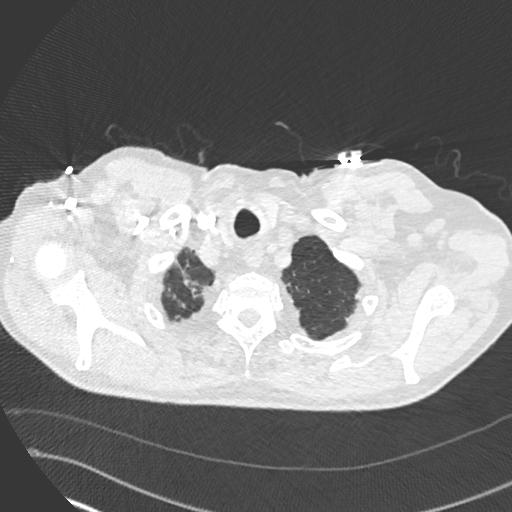
[im 262/278  mediastinal]
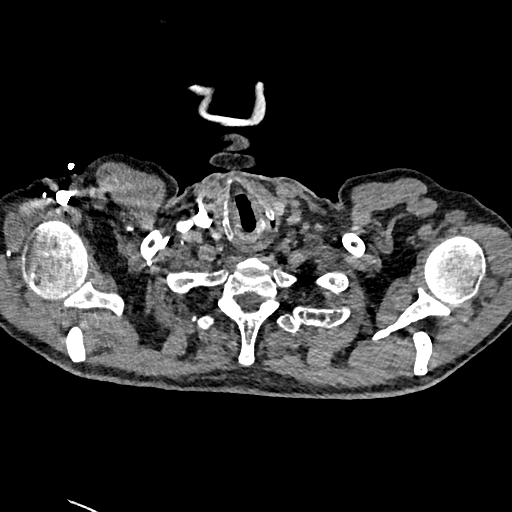

[Series 8: pe 2mm cor · coronal · 0.59mm/px · 1 of 149 slices shown]
[im 75/149  mediastinal]
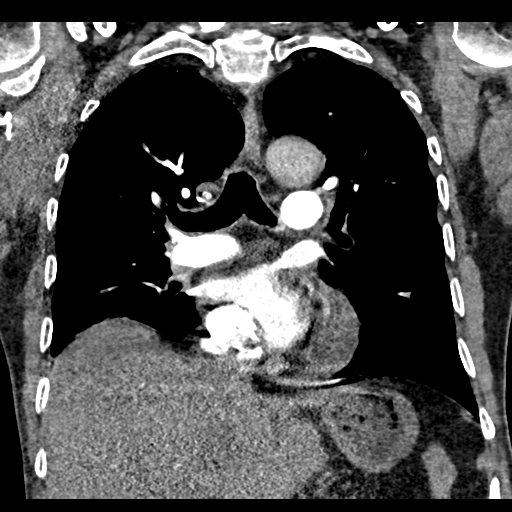

[17 of 36 positions shown; findings below may reference images not displayed]

FINDINGS: Cardiovascular: Satisfactory opacification of the pulmonary arteries
to the segmental level. Extensive filling defect within right lower
lobe lobar, segmental, and subsegmental arteries. Additional filling
defects within segmental and subsegmental branches of the right
upper and middle lobe arterial branches. Filling defect also present
within left upper lobe lobar, segmental and subsegmental branches
with additional small filling defects present within segmental and
subsegmental left lower lobe arterial branches. RV LV ratio elevated
at 3.2. Small pericardial effusion. Mildly ectatic ascending aorta.
Bovine arch variant. Atherosclerotic calcifications.

Mediastinum/Nodes: Trachea is midline. Thyroid grossly unremarkable.
Scattered subcentimeter lymph nodes in the mediastinum, grossly
unchanged, for example precarinal lymph node measures 1 cm.
Ill-defined low density within the bilateral hilar regions,
uncertain if this is all due to thrombus. The esophagus is grossly
unremarkable.

Lungs/Pleura: Mild hazy density in the perihilar regions. No
consolidation, effusion or pneumothorax. Within the anterior right
lung base, there is a 6 x 4 mm oval nodule, 5 mm average diameter,
series 6, image number 100.

Upper Abdomen: No acute abnormality in the upper abdomen, possible
mildly nodular contour of the liver. Mild gynecomastia.

Musculoskeletal: Degenerative changes of the spine. No acute or
suspicious bone lesions.

Review of the MIP images confirms the above findings.
IMPRESSION: 1. The study is positive for acute bilateral pulmonary emboli,
extensive on the right side. Positive for acute PE with CT evidence
of right heart strain (RV/LV Ratio = 3.2) consistent with at least
submassive (intermediate risk) PE. The presence of right heart
strain has been associated with an increased risk of morbidity and
mortality. Please activate Code PE by paging [phone].
2. Ill-defined soft tissue density within the bilateral hilar
regions, uncertain if this is all secondary to thrombus, suggest
short interval CT follow-up to exclude hilar adenopathy or possible
developing soft tissue density in the left greater than right hila.
3. 5 mm pulmonary nodule in the anterior right lung base. No
follow-up needed if patient is low-risk. Non-contrast chest CT can
be considered in 12 months if patient is high-risk. This
recommendation follows the consensus statement: Guidelines for
Management of Incidental Pulmonary Nodules Detected on CT Images:
4. Mild hazy attenuation within the bilateral perihilar regions
could relate to mild edema.
Critical Value/emergent results were called by telephone at the time
of interpretation on 08/04/2016 at [DATE] to Dr. EBIOWEI SANGSTER , who
verbally acknowledged these results.

## 2018-08-30 IMAGING — DX DG CHEST 1V PORT
1 series · 1 of 1 positions shown · non-contrast
Comparison: 10/25/2014

CLINICAL DATA: Chest pain, shortness of Breath

EXAM:
PORTABLE CHEST 1 VIEW

[chest ap]
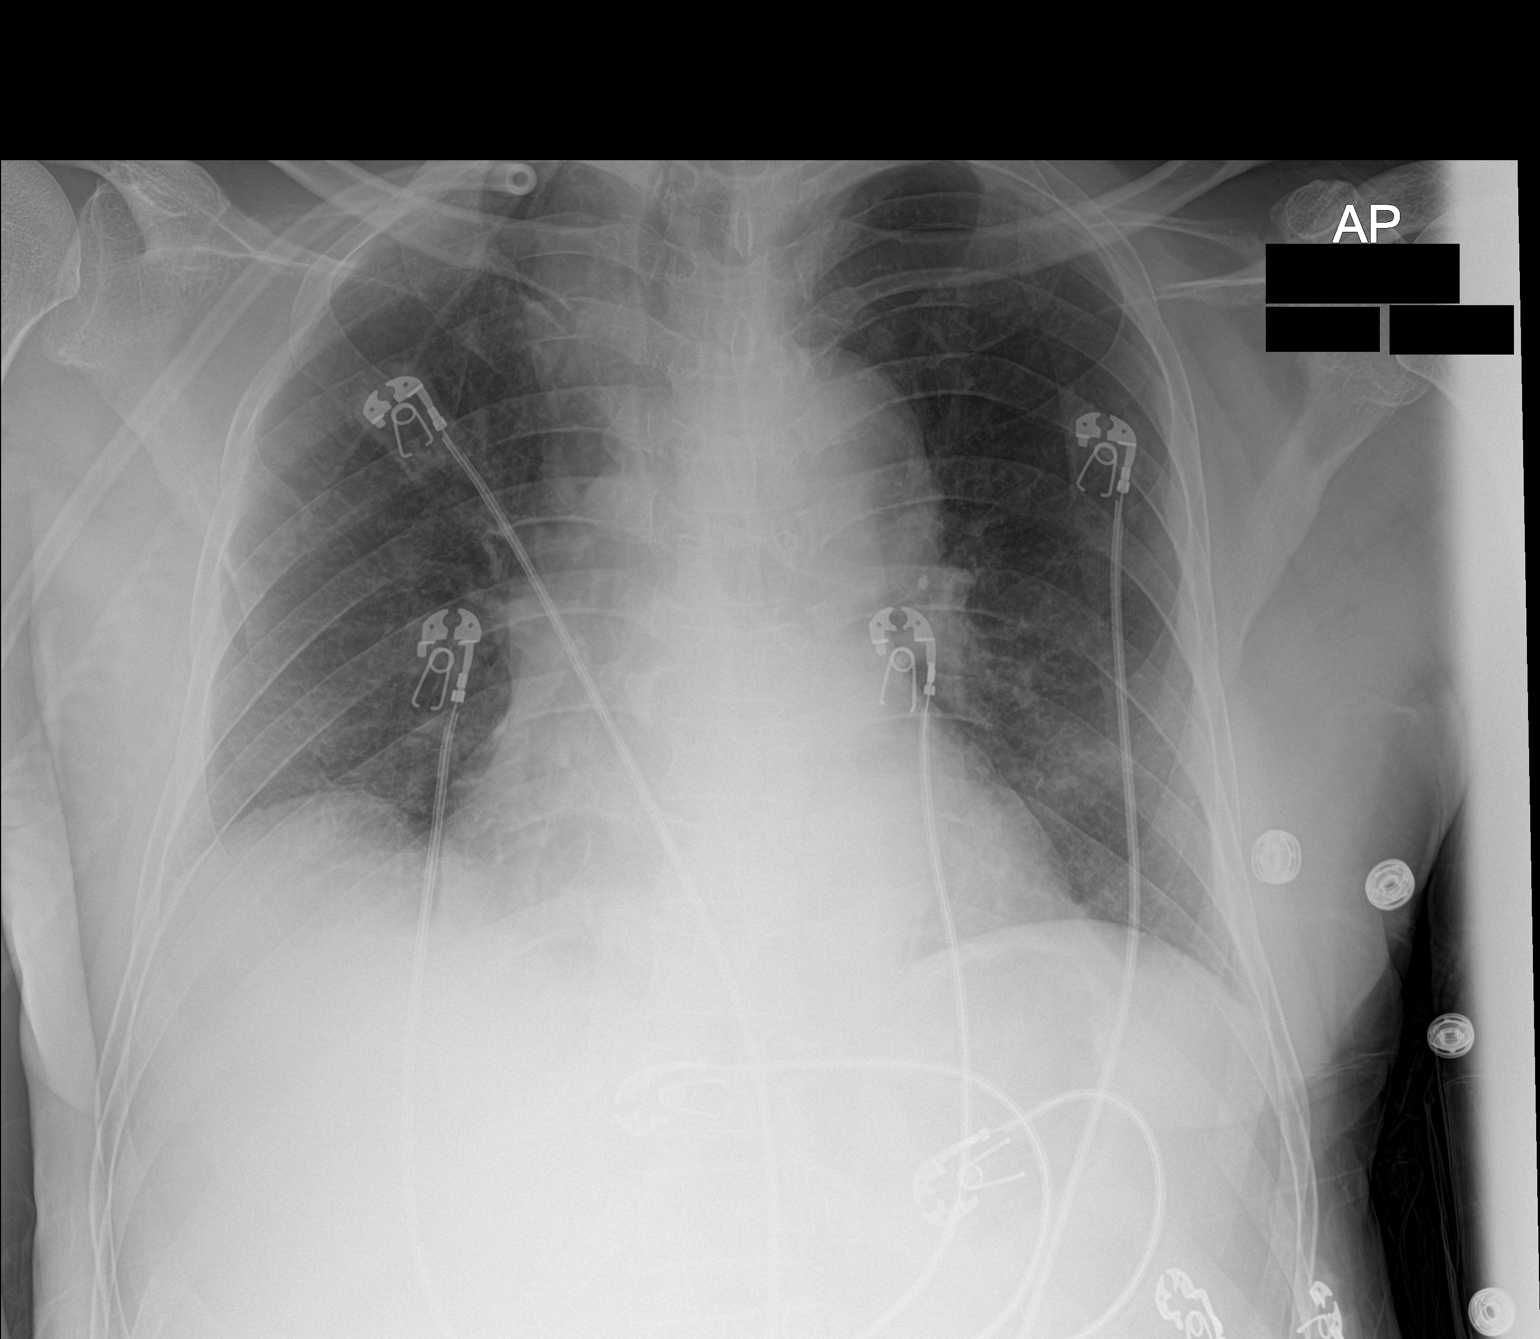

[1 of 1 positions shown; findings below may reference images not displayed]

FINDINGS: Cardiomediastinal silhouette is stable. No infiltrate or pulmonary
edema. Stable atelectasis or scarring in lingula. Mild elevation of
the right hemidiaphragm again noted.
IMPRESSION: No active disease. Stable atelectasis or scarring in lingula. Again
noted mild elevation of the right hemidiaphragm.
